# Patient Record
Sex: Female | Born: 1950
Health system: Southern US, Community
[De-identification: ages and names within clinical notes are randomized; demographics above are authoritative.]

## PROBLEM LIST (undated history)

## (undated) DIAGNOSIS — M8580 Other specified disorders of bone density and structure, unspecified site: Secondary | ICD-10-CM

## (undated) DIAGNOSIS — Z1231 Encounter for screening mammogram for malignant neoplasm of breast: Secondary | ICD-10-CM

## (undated) DIAGNOSIS — F5081 Binge eating disorder: Secondary | ICD-10-CM

## (undated) DIAGNOSIS — T7840XA Allergy, unspecified, initial encounter: Secondary | ICD-10-CM

## (undated) DIAGNOSIS — D649 Anemia, unspecified: Secondary | ICD-10-CM

## (undated) DIAGNOSIS — H269 Unspecified cataract: Secondary | ICD-10-CM

## (undated) DIAGNOSIS — K259 Gastric ulcer, unspecified as acute or chronic, without hemorrhage or perforation: Secondary | ICD-10-CM

## (undated) DIAGNOSIS — H04129 Dry eye syndrome of unspecified lacrimal gland: Secondary | ICD-10-CM

## (undated) DIAGNOSIS — K219 Gastro-esophageal reflux disease without esophagitis: Secondary | ICD-10-CM

## (undated) DIAGNOSIS — F32A Depression, unspecified: Secondary | ICD-10-CM

## (undated) DIAGNOSIS — G709 Myoneural disorder, unspecified: Secondary | ICD-10-CM

## (undated) DIAGNOSIS — R195 Other fecal abnormalities: Secondary | ICD-10-CM

## (undated) HISTORY — DX: Other fecal abnormalities: R19.5

## (undated) HISTORY — DX: Dry eye syndrome of unspecified lacrimal gland: H04.129

## (undated) HISTORY — PX: EYE SURGERY: SHX253

## (undated) HISTORY — DX: Depression, unspecified: F32.A

## (undated) HISTORY — DX: Allergy, unspecified, initial encounter: T78.40XA

## (undated) HISTORY — DX: Anemia, unspecified: D64.9

## (undated) HISTORY — DX: Unspecified cataract: H26.9

## (undated) HISTORY — DX: Gastro-esophageal reflux disease without esophagitis: K21.9

## (undated) HISTORY — PX: FOOT SURGERY: SHX648

## (undated) HISTORY — DX: Myoneural disorder, unspecified: G70.9

## (undated) HISTORY — DX: Gastric ulcer, unspecified as acute or chronic, without hemorrhage or perforation: K25.9

## (undated) MED ORDER — NAPROXEN 500 MG TAB: 500 mg | ORAL_TABLET | Freq: Two times a day (BID) | ORAL | Status: AC

---

## 1979-10-31 HISTORY — PX: OTHER SURGICAL HISTORY: SHX169

## 1999-03-02 ENCOUNTER — Ambulatory Visit (HOSPITAL_COMMUNITY): Admission: RE | Admit: 1999-03-02 | Discharge: 1999-03-02 | Payer: Self-pay | Admitting: Gastroenterology

## 2000-12-04 ENCOUNTER — Encounter: Payer: Self-pay | Admitting: Internal Medicine

## 2000-12-04 ENCOUNTER — Encounter: Admission: RE | Admit: 2000-12-04 | Discharge: 2000-12-04 | Payer: Self-pay | Admitting: Internal Medicine

## 2001-07-09 ENCOUNTER — Encounter: Payer: Self-pay | Admitting: Internal Medicine

## 2001-07-09 ENCOUNTER — Encounter: Admission: RE | Admit: 2001-07-09 | Discharge: 2001-07-09 | Payer: Self-pay | Admitting: Internal Medicine

## 2001-11-20 ENCOUNTER — Other Ambulatory Visit: Admission: RE | Admit: 2001-11-20 | Discharge: 2001-11-20 | Payer: Self-pay | Admitting: Obstetrics and Gynecology

## 2002-11-21 ENCOUNTER — Other Ambulatory Visit: Admission: RE | Admit: 2002-11-21 | Discharge: 2002-11-21 | Payer: Self-pay | Admitting: Obstetrics and Gynecology

## 2003-11-26 ENCOUNTER — Other Ambulatory Visit: Admission: RE | Admit: 2003-11-26 | Discharge: 2003-11-26 | Payer: Self-pay | Admitting: Obstetrics and Gynecology

## 2009-07-27 LAB — METABOLIC PANEL, COMPREHENSIVE
A-G Ratio: 1.3 (ref 1.2–3.5)
ALT (SGPT): 27 U/L — ABNORMAL LOW (ref 39–65)
AST (SGOT): 15 U/L (ref 15–37)
Albumin: 3.8 g/dL (ref 3.5–5.0)
Alk. phosphatase: 173 U/L — ABNORMAL HIGH (ref 50–136)
Anion gap: 5 mmol/L — ABNORMAL LOW (ref 7–16)
BUN: 17 MG/DL (ref 7–18)
Bilirubin, total: 0.8 MG/DL (ref 0.2–1.1)
CO2: 32 MMOL/L (ref 21–32)
Calcium: 8.8 MG/DL (ref 8.4–10.4)
Chloride: 106 MMOL/L (ref 98–107)
Creatinine: 0.9 MG/DL (ref 0.6–1.0)
GFR est AA: >60 mL/min/{1.73_m2} (ref >60)
GFR est non-AA: >60 mL/min/{1.73_m2} (ref >60)
Globulin: 3 g/dL (ref 2.3–3.5)
Glucose: 83 MG/DL (ref 74–106)
Potassium: 3.7 MMOL/L (ref 3.5–5.1)
Protein, total: 6.8 g/dL (ref 6.3–8.2)
Sodium: 143 MMOL/L (ref 136–145)

## 2009-07-27 LAB — LIPID PANEL
CHOL/HDL Ratio: 2.7
Cholesterol, total: 154 MG/DL (ref <200)
HDL Cholesterol: 57 MG/DL (ref 40–60)
LDL, calculated: 84.6 MG/DL (ref <100)
Triglyceride: 62 MG/DL (ref 35–150)
VLDL, calculated: 12.4 MG/DL (ref 7.0–27.0)

## 2009-07-27 LAB — BILIRUBIN, DIRECT: Bilirubin, direct: 0.1 MG/DL (ref <0.4)

## 2009-07-27 LAB — TSH 3RD GENERATION: TSH: 2.135 u[IU]/mL (ref 0.358–3.740)

## 2009-07-28 LAB — VITAMIN D, 25 HYDROXY: Vitamin D 25-Hydroxy: 26 ng/mL — ABNORMAL LOW (ref 30–80)

## 2009-07-28 LAB — GGT: GGT: 17 U/L (ref 5–55)

## 2010-12-07 ENCOUNTER — Other Ambulatory Visit (INDEPENDENT_AMBULATORY_CARE_PROVIDER_SITE_OTHER): Payer: Self-pay | Admitting: Internal Medicine

## 2010-12-13 ENCOUNTER — Ambulatory Visit (HOSPITAL_COMMUNITY): Payer: Medicaid Other

## 2011-06-19 ENCOUNTER — Other Ambulatory Visit: Payer: Self-pay

## 2012-06-27 ENCOUNTER — Encounter

## 2012-07-12 ENCOUNTER — Encounter

## 2014-04-29 NOTE — Telephone Encounter (Signed)
Pt called for medication refill on vyvanse

## 2014-04-30 MED ORDER — LISDEXAMFETAMINE 30 MG CAP
30 mg | ORAL_CAPSULE | ORAL | Status: DC
Start: 2014-04-30 — End: 2014-05-28

## 2014-05-04 NOTE — Telephone Encounter (Signed)
Called patient and informed that it is ready at the front office for pick.

## 2014-05-07 MED ORDER — TRIMETHOPRIM-SULFAMETHOXAZOLE 160 MG-800 MG TAB
160-800 mg | ORAL_TABLET | Freq: Two times a day (BID) | ORAL | Status: AC
Start: 2014-05-07 — End: 2014-05-10

## 2014-05-07 NOTE — Patient Instructions (Signed)
Urinary Tract Infection in Women: After Your Visit  Your Care Instructions     A urinary tract infection, or UTI, is a general term for an infection anywhere between the kidneys and the urethra (where urine comes out). Most UTIs are bladder infections. They often cause pain or burning when you urinate.  UTIs are caused by bacteria and can be cured with antibiotics. Be sure to complete your treatment so that the infection goes away.  Follow-up care is a key part of your treatment and safety. Be sure to make and go to all appointments, and call your doctor if you are having problems. It's also a good idea to know your test results and keep a list of the medicines you take.  How can you care for yourself at home?  ?? Take your antibiotics as directed. Do not stop taking them just because you feel better. You need to take the full course of antibiotics.  ?? Drink extra water and other fluids for the next day or two. This may help wash out the bacteria that are causing the infection. (If you have kidney, heart, or liver disease and have to limit fluids, talk with your doctor before you increase your fluid intake.)  ?? Avoid drinks that are carbonated or have caffeine. They can irritate the bladder.  ?? Urinate often. Try to empty your bladder each time.  ?? To relieve pain, take a hot bath or lay a heating pad set on low over your lower belly or genital area. Never go to sleep with a heating pad in place.  To prevent UTIs  ?? Drink plenty of water each day. This helps you urinate often, which clears bacteria from your system. (If you have kidney, heart, or liver disease and have to limit fluids, talk with your doctor before you increase your fluid intake.)  ?? Consider adding cranberry juice to your diet.  ?? Urinate when you need to.  ?? Urinate right after you have sex.  ?? Change sanitary pads often.  ?? Avoid douches, bubble baths, feminine hygiene sprays, and other feminine hygiene products that have deodorants.   ?? After going to the bathroom, wipe from front to back.  When should you call for help?  Call your doctor now or seek immediate medical care if:  ?? Symptoms such as fever, chills, nausea, or vomiting get worse or appear for the first time.  ?? You have new pain in your back just below your rib cage. This is called flank pain.  ?? There is new blood or pus in your urine.  ?? You have any problems with your antibiotic medicine.  Watch closely for changes in your health, and be sure to contact your doctor if:  ?? You are not getting better after taking an antibiotic for 2 days.  ?? Your symptoms go away but then come back.   Where can you learn more?   Go to http://www.healthwise.net/[facility]  Enter K848 in the search box to learn more about "Urinary Tract Infection in Women: After Your Visit."   ?? 2006-2015 Healthwise[employer] Inc[employer]. Care instructions adapted under license by [facility] (which disclaims liability or warranty for this information). This care instruction is for use with your licensed healthcare professional. If you have questions about a medical condition or this instruction, always ask your healthcare professional. Healthwise, Incorporated disclaims any warranty or liability for your use of this information.  Content Version: 10.5.422740; Current as of: July 08, 2013

## 2014-05-07 NOTE — Progress Notes (Signed)
05/07/2014      Chief Complaint   Patient presents with   ??? Urinary Frequency      for 2 days         HPI :  Urinary Frequency   The history is provided by the patient. This is a new problem. The current episode started 2 days ago. The problem occurs every urination. The problem has been gradually worsening. The quality of the pain is described as burning. The pain is mild (only during urination). There has been no fever. There is no history of pyelonephritis. Associated symptoms include frequency and hematuria (small amt of blood when she wiped after urination). Pertinent negatives include no chills, no sweats, no nausea, no vomiting, no flank pain, no vaginal discharge, no abdominal pain and no back pain. The patient is not pregnant.She has tried nothing for the symptoms. Her past medical history does not include recurrent UTIs.         Review of Systems   Constitutional: Negative for chills and fatigue.   Gastrointestinal: Negative for nausea, vomiting and abdominal pain.   Genitourinary: Positive for frequency and hematuria (small amt of blood when she wiped after urination). Negative for flank pain and vaginal discharge.   Musculoskeletal: Negative for back pain.   Neurological: Negative for dizziness.       History:    Past Medical History   Diagnosis Date   ??? Anemia    ??? Joint problem    ??? Binge eating    ??? Eye problem        Current Outpatient Prescriptions   Medication Sig   ??? temazepam (RESTORIL) 15 mg capsule 15 mg nightly as needed.   ??? lisdexamfetamine (VYVANSE) 30 mg capsule Take 1 Cap by mouth every morning for 30 days. Max Daily Amount: 30 mg.   ??? cyanocobalamin (VITAMIN B-12) 1,000 mcg tablet Take 1,000 mcg by mouth daily.   ??? ranitidine (ZANTAC) 300 mg tablet Take 300 mg by mouth daily.   ??? KRILL/OM3/DHA/EPA/OM6/LIP/ASTX (KRILL OIL, OMEGA 3 & 6, PO) Take  by mouth.   ??? GLUCOSAM/MSM/CHONDR/VIT C/HYAL (GLUCOSAMINE-CHONDROITIN-MSM PO) Take  by mouth.    ??? calcium-vitamin D 600 mg(1,500mg ) -200 unit tab Take 1 Tab by mouth two (2) times daily (with meals).   ??? naproxen (NAPROSYN) 500 mg tablet Take 1 Tab by mouth two (2) times daily (with meals).     No current facility-administered medications for this visit.       No Known Allergies    BP 142/90 mmHg   Pulse 68   Resp 16   Ht 5' 9.5" (1.765 m)   Wt 173 lb (78.472 kg)   BMI 25.19 kg/m2    Physical Exam   Constitutional: She appears well-developed and well-nourished. No distress.   HENT:   Head: Normocephalic and atraumatic.   Eyes: Conjunctivae are normal.   Neck: Neck supple.   Cardiovascular: Normal rate, regular rhythm and normal heart sounds.  Exam reveals no gallop and no friction rub.    No murmur heard.  Pulmonary/Chest: Effort normal and breath sounds normal.   Abdominal: Soft. Bowel sounds are normal. She exhibits no mass. There is no tenderness.   Musculoskeletal: She exhibits no edema.   Psychiatric: She has a normal mood and affect.           Assessment      1. UTI (lower urinary tract infection)    2. Urinary frequency  Plan:   1. UTI (lower urinary tract infection)    - trimethoprim-sulfamethoxazole (BACTRIM DS, SEPTRA DS) 160-800 mg per tablet; Take 1 Tab by mouth two (2) times a day for 3 days.  Dispense: 6 Tab; Refill: 0    2. Urinary frequency    - Urinalysis dip stick auo w/o micro  - trimethoprim-sulfamethoxazole (BACTRIM DS, SEPTRA DS) 160-800 mg per tablet; Take 1 Tab by mouth two (2) times a day for 3 days.  Dispense: 6 Tab; Refill: 0    She is encouraged to increase oral fluids.  Patient instructed on indications for use of new medications, proper administration, and potential side effects. Patient gave verbal understanding of use of and indication for new medication. Questions about  new medications answered today.     Follow-up Disposition:  Return keep scheduled appt.

## 2014-05-28 MED ORDER — LISDEXAMFETAMINE 30 MG CAP
30 mg | ORAL_CAPSULE | ORAL | Status: AC
Start: 2014-05-28 — End: 2014-06-27

## 2014-05-28 NOTE — Patient Instructions (Signed)
Continue medications as prescribed. Continue to exercise and follow 1200 cal diet

## 2014-05-28 NOTE — Progress Notes (Signed)
05/28/2014      Chief Complaint   Patient presents with   ??? Eating Disorder     Following up on Binge eating   ??? Numbness     follow up, as long as she is easy on her hands she is doing bettter. Recently moved to [facility]    ??? Medication Refill     vyvanse 30 mg          HPI :  Eating Disorder  History provided by: Patient following up on Binge eating Disorder. Currently on Vyvanse. . Tolerating medication well. No side effects. The problem has been rapidly improving. Pertinent negatives include no chest pain.   Numbness  History provided by: Following up on numbness and tingling in her hands.  Chronicity: Relatively new. The current episode started more than 1 week ago. Progression since onset: Improved some, then worsened as she packed in order to move to [place].  There was left upper extremity and right upper extremity focality noted. Pertinent negatives include no chest pain, no vomiting, no confusion and no nausea. Associated symptoms comments: none.   Requests refill of Vyvanse      Review of Systems   Constitutional: Negative for fever and fatigue.   Eyes: Negative for visual disturbance.   Respiratory: Negative for cough.    Cardiovascular: Negative for chest pain and palpitations.   Gastrointestinal: Negative for nausea, vomiting and diarrhea.   Neurological: Negative for tremors and weakness.   Psychiatric/Behavioral: Negative for confusion and decreased concentration.        Mood is normal       History:    Past Medical History   Diagnosis Date   ??? Anemia    ??? Joint problem    ??? Binge eating    ??? Eye problem        Current Outpatient Prescriptions   Medication Sig   ??? temazepam (RESTORIL) 15 mg capsule 15 mg nightly as needed.   ??? lisdexamfetamine (VYVANSE) 30 mg capsule Take 1 Cap by mouth every morning for 30 days. Max Daily Amount: 30 mg.   ??? cyanocobalamin (VITAMIN B-12) 1,000 mcg tablet Take 1,000 mcg by mouth daily.   ??? ranitidine (ZANTAC) 300 mg tablet Take 300 mg by mouth daily.    ??? KRILL/OM3/DHA/EPA/OM6/LIP/ASTX (KRILL OIL, OMEGA 3 & 6, PO) Take  by mouth.   ??? GLUCOSAM/MSM/CHONDR/VIT C/HYAL (GLUCOSAMINE-CHONDROITIN-MSM PO) Take  by mouth.   ??? calcium-vitamin D 600 mg(1,500mg ) -200 unit tab Take 1 Tab by mouth three (3) times daily (with meals).   ??? naproxen (NAPROSYN) 500 mg tablet Take 1 Tab by mouth two (2) times daily (with meals).     No current facility-administered medications for this visit.       Allergies   Allergen Reactions   ??? Celebrex [Celecoxib] Rash       BP 130/66 mmHg   Pulse 76   Resp 16   Ht 5' 9.5" (1.765 m)   Wt 169 lb (76.658 kg)   BMI 24.61 kg/m2    Physical Exam   Constitutional: She is oriented to person, place, and time. She appears well-developed and well-nourished. No distress.   HENT:   Head: Normocephalic and atraumatic.   Eyes: Conjunctivae and EOM are normal.   Neck: Normal range of motion. Neck supple.   Cardiovascular: Normal heart sounds.    Pulmonary/Chest: Effort normal and breath sounds normal.   Neurological: She is alert and oriented to person, place, and time.   No focal  deficit   Psychiatric: She has a normal mood and affect.       Assessment and Plan       ICD-9-CM    1. Binge-eating disorder, in parial remission, mild 307.1 lisdexamfetamine (VYVANSE) 30 mg capsule    Doing well on current treatment   2. Paresthesia and pain of both upper extremities 782.0     729.5     improving     3. BMI between 19-24,adult V85.1     Sh has lost weight since she started Vyvanse       1. Binge-eating disorder, in parial remission, mild  She will continue medication.     2. Paresthesia and pain of both upper extremities  Seems to vary with activity. She is willing to not do anything right now      Follow-up Disposition:  Return if symptoms worsen or fail to improve.

## 2014-07-08 MED ORDER — LISDEXAMFETAMINE 30 MG CAP
30 mg | ORAL_CAPSULE | Freq: Every day | ORAL | Status: DC
Start: 2014-07-08 — End: 2014-07-27

## 2014-07-08 NOTE — Telephone Encounter (Signed)
Patient called and states that we have given prescription for Vyvanse 30 mg for wrong person and would like to get right one.

## 2014-07-09 NOTE — Telephone Encounter (Signed)
Patient was here yesterday and corrected prescription was given to patient herself

## 2014-07-22 NOTE — Telephone Encounter (Signed)
Pt called for medication refill on vyvanse

## 2014-07-22 NOTE — Telephone Encounter (Signed)
Last picked this prescription on 09/09, due on 10/9. Discussed with Dr. Lelon Perla and she states that she can pick this prescription around 07/31/2014

## 2014-07-27 NOTE — Telephone Encounter (Signed)
Requested Prescriptions     Pending Prescriptions Disp Refills   ??? lisdexamfetamine (VYVANSE) 30 mg capsule 30 Cap 0     Sig: Take 1 Cap by mouth daily for 30 days. Max Daily Amount: 30 mg.     Patient can pick up around 10/02 per Dr. Lelon Perla

## 2014-07-28 MED ORDER — LISDEXAMFETAMINE 30 MG CAP
30 mg | ORAL_CAPSULE | Freq: Every day | ORAL | Status: DC
Start: 2014-07-28 — End: 2014-08-31

## 2014-07-28 NOTE — Telephone Encounter (Signed)
Pt will pick up rx Thursday afternoon.

## 2014-08-28 ENCOUNTER — Encounter

## 2014-08-28 NOTE — Telephone Encounter (Signed)
Pt called for medication refill on vyvance

## 2014-08-31 MED ORDER — LISDEXAMFETAMINE 30 MG CAP
30 mg | ORAL_CAPSULE | Freq: Every day | ORAL | Status: AC
Start: 2014-08-31 — End: 2014-09-30

## 2014-08-31 NOTE — Telephone Encounter (Signed)
Requested Prescriptions     Pending Prescriptions Disp Refills   ??? lisdexamfetamine (VYVANSE) 30 mg capsule 30 Cap 0     Sig: Take 1 Cap by mouth daily for 30 days. Max Daily Amount: 30 mg.

## 2014-09-01 NOTE — Telephone Encounter (Signed)
Patient notified

## 2014-09-16 ENCOUNTER — Other Ambulatory Visit (HOSPITAL_COMMUNITY): Payer: Self-pay | Admitting: Internal Medicine

## 2014-09-16 DIAGNOSIS — R221 Localized swelling, mass and lump, neck: Secondary | ICD-10-CM

## 2014-09-23 ENCOUNTER — Ambulatory Visit (HOSPITAL_COMMUNITY): Payer: Medicaid Other

## 2014-09-30 NOTE — Telephone Encounter (Signed)
Pt called and requested a refill on Vyvanse. She has not been seen in almost 5 mos.

## 2014-10-05 NOTE — Telephone Encounter (Signed)
Will need to be seen, can you schedule

## 2015-01-29 DIAGNOSIS — K219 Gastro-esophageal reflux disease without esophagitis: Secondary | ICD-10-CM | POA: Insufficient documentation

## 2015-06-24 ENCOUNTER — Encounter: Admission: RE | Payer: Self-pay | Source: Ambulatory Visit

## 2015-06-24 ENCOUNTER — Ambulatory Visit: Admission: RE | Admit: 2015-06-24 | Payer: Medicaid Other | Source: Ambulatory Visit | Admitting: Gastroenterology

## 2015-06-24 SURGERY — COLONOSCOPY WITH PROPOFOL
Anesthesia: General

## 2016-01-04 ENCOUNTER — Encounter: Payer: Self-pay | Admitting: Family Medicine

## 2016-01-04 ENCOUNTER — Ambulatory Visit (INDEPENDENT_AMBULATORY_CARE_PROVIDER_SITE_OTHER): Payer: BLUE CROSS/BLUE SHIELD | Admitting: Family Medicine

## 2016-01-04 VITALS — BP 136/81 | HR 78 | Temp 98.7°F | Ht 68.0 in | Wt 204.0 lb

## 2016-01-04 DIAGNOSIS — D649 Anemia, unspecified: Secondary | ICD-10-CM

## 2016-01-04 DIAGNOSIS — R635 Abnormal weight gain: Secondary | ICD-10-CM | POA: Diagnosis not present

## 2016-01-04 DIAGNOSIS — F50819 Binge eating disorder, unspecified: Secondary | ICD-10-CM | POA: Insufficient documentation

## 2016-01-04 DIAGNOSIS — F5081 Binge eating disorder: Secondary | ICD-10-CM | POA: Diagnosis not present

## 2016-01-04 DIAGNOSIS — F32A Depression, unspecified: Secondary | ICD-10-CM

## 2016-01-04 DIAGNOSIS — F329 Major depressive disorder, single episode, unspecified: Secondary | ICD-10-CM | POA: Diagnosis not present

## 2016-01-04 DIAGNOSIS — D509 Iron deficiency anemia, unspecified: Secondary | ICD-10-CM | POA: Insufficient documentation

## 2016-01-04 NOTE — Progress Notes (Signed)
BP 136/81 mmHg  Pulse 78  Temp(Src) 98.7 F (37.1 C)  Ht 5\' 8"  (1.727 m)  Wt 204 lb (92.534 kg)  BMI 31.03 kg/m2  SpO2 100%   Subjective:    Patient ID: Mckenzie Webster, female    DOB: 06-14-1951, 65 y.o.   MRN: [id number]  HPI: Mckenzie Webster is a 65 y.o. female who presents today to establish care. Hasn't seen a doctor regularly in the past year or so.   Chief Complaint  Patient presents with  . Depression    She has tried several different medications in the past that seemed to not have worked.  . Weight Gain    Patient has gained about 35 lbs in the last year. She believes that the depression is related to the weight gain. She was previously on Vyvanse for weight loss, and she believes that it did work, she states that she is a binge eater, so that medication helped it   WEIGHT GAIN- eats compulsively, has had a problem for years. Used vyvanse in the past for a bit less than a year and had been doing well with it. Has binge eating disorder. Depression seems to have direct correlation with her weight gain. Has not worked with [employer] [facility] in the past Duration: chronic for years Previous attempts at weight loss: yes Complications of obesity: none Peak weight: 245 pre bypass, 220 post-bypass Weight loss goal: 180 Weight loss to date: 16lbs Requesting obesity pharmacotherapy: yes Current weight loss supplements/medications: no Previous weight loss supplements/meds: yes, vyvanse, diet pills  DEPRESSION Mood status: exacerbated Satisfied with current treatment?: no Symptom severity: moderate to severe Duration of current treatment : has not had any treatment recently Psychotherapy/counseling: yes, in the past, wasn't very successful  Previous psychiatric medications: wellbutrin Depressed mood: yes Anxious mood: no Anhedonia: no Significant weight loss or gain: yes Insomnia: yes hard to stay asleep, does OK with the melatonin Fatigue: no, unmotivated Feelings of  worthlessness or guilt: yes Impaired concentration/indecisiveness: yes Suicidal ideations: no Hopelessness: no Crying spells: yes Depression screen PHQ 2/9 01/04/2016  Decreased Interest 2  Down, Depressed, Hopeless 2  PHQ - 2 Score 4  Altered sleeping 2  Tired, decreased energy 3  Change in appetite 3  Feeling bad or failure about yourself  3  Trouble concentrating 1  Moving slowly or fidgety/restless 0  Suicidal thoughts 0  PHQ-9 Score 16  Difficult doing work/chores Somewhat difficult   GAD 7 : Generalized Anxiety Score 01/04/2016  Nervous, Anxious, on Edge 1  Control/stop worrying 3  Worry too much - different things 3  Trouble relaxing 2  Restless 0  Easily annoyed or irritable 2  Afraid - awful might happen 1  Total GAD 7 Score 12  Anxiety Difficulty Somewhat difficult    Active Ambulatory Problems    Diagnosis Date Noted  . Anemia   . Binge eating disorder 01/04/2016   Resolved Ambulatory Problems    Diagnosis Date Noted  . No Resolved Ambulatory Problems   Past Medical History  Diagnosis Date  . Cataract   . Stomach ulcer   . Dry eye syndrome    Past Surgical History  Procedure Laterality Date  . Eye surgery      Cataract  . Gastric bypass  1981  . Foot surgery      Change the alignment of the toes to stop the formation of corns   No Known Allergies  Family History  Problem Relation Age of Onset  .  Heart disease Mother   . COPD Mother   . Heart disease Father   . Hypertension Father   . Heart disease Maternal Grandfather   . Alzheimer's disease Paternal Grandmother    Social History   Social History  . Marital Status: Legally Separated    Spouse Name: N/A  . Number of Children: N/A  . Years of Education: N/A   Social History Main Topics  . Smoking status: Former Smoker    Quit date: 10/31/1995  . Smokeless tobacco: Never Used  . Alcohol Use: Yes     Comment: on occasion  . Drug Use: No  . Sexual Activity: Not Currently   Other  Topics Concern  . None   Social History Narrative  . None   Review of Systems  Constitutional: Negative.   Respiratory: Negative.   Cardiovascular: Negative.   Musculoskeletal: Negative.   Psychiatric/Behavioral: Negative.     Per HPI unless specifically indicated above     Objective:    BP 136/81 mmHg  Pulse 78  Temp(Src) 98.7 F (37.1 C)  Ht 5\' 8"  (1.727 m)  Wt 204 lb (92.534 kg)  BMI 31.03 kg/m2  SpO2 100%  Wt Readings from Last 3 Encounters:  01/04/16 204 lb (92.534 kg)    Physical Exam  Constitutional: She is oriented to person, place, and time. She appears well-developed and well-nourished. No distress.  HENT:  Head: Normocephalic and atraumatic.  Right Ear: Hearing normal.  Left Ear: Hearing normal.  Nose: Nose normal.  Eyes: Conjunctivae and lids are normal. Right eye exhibits no discharge. Left eye exhibits no discharge. No scleral icterus.  Cardiovascular: Normal rate, regular rhythm, normal heart sounds and intact distal pulses.  Exam reveals no gallop and no friction rub.   No murmur heard. Pulmonary/Chest: Effort normal and breath sounds normal. No respiratory distress. She has no wheezes. She has no rales. She exhibits no tenderness.  Musculoskeletal: Normal range of motion.  Neurological: She is alert and oriented to person, place, and time.  Skin: Skin is warm, dry and intact. No rash noted. No erythema. No pallor.  Psychiatric: She has a normal mood and affect. Her speech is normal and behavior is normal. Judgment and thought content normal. Cognition and memory are normal.  Nursing note and vitals reviewed.   No results found for this or any previous visit.    Assessment & Plan:   Problem List Items Addressed This Visit      Other   Anemia    Checking labs today. Await results.       Relevant Orders   CBC with Differential/Platelet   Binge eating disorder - Primary    Under poor control. Not in any counseling. Not in OA. Information  about both of these given today. Would like to go back on vyvanse. I informed patient that I don't prescribe controlled substances on the first visit. Will obtain previous records from last doctor and determine if this is a good medicine for her. Await records.        Other Visit Diagnoses    Depression        Due to weight gain. Will try to get her into OA and counseling. Await records.     Relevant Orders    Thyroid Panel With TSH    Comprehensive metabolic panel    Weight gain        Due to binge eating disorder. Will work on getting that treated.    Relevant Orders  Thyroid Panel With TSH    Comprehensive metabolic panel        Follow up plan: Return Pending records release.

## 2016-01-04 NOTE — Assessment & Plan Note (Signed)
Under poor control. Not in any counseling. Not in OA. Information about both of these given today. Would like to go back on vyvanse. I informed patient that I don't prescribe controlled substances on the first visit. Will obtain previous records from last doctor and determine if this is a good medicine for her. Await records.

## 2016-01-04 NOTE — Assessment & Plan Note (Signed)
Checking labs today. Await results.  

## 2016-01-05 ENCOUNTER — Telehealth: Payer: Self-pay | Admitting: Family Medicine

## 2016-01-05 DIAGNOSIS — D649 Anemia, unspecified: Secondary | ICD-10-CM

## 2016-01-05 LAB — CBC WITH DIFFERENTIAL/PLATELET
Basophils Absolute: 0 10*3/uL (ref 0.0–0.2)
Basos: 1 %
EOS (ABSOLUTE): 0.2 10*3/uL (ref 0.0–0.4)
EOS: 5 %
Hematocrit: 25.7 % — ABNORMAL LOW (ref 34.0–46.6)
Hemoglobin: 7.1 g/dL — ABNORMAL LOW (ref 11.1–15.9)
IMMATURE GRANULOCYTES: 0 %
Immature Grans (Abs): 0 10*3/uL (ref 0.0–0.1)
Lymphocytes Absolute: 1.3 10*3/uL (ref 0.7–3.1)
Lymphs: 29 %
MCH: 17 pg — ABNORMAL LOW (ref 26.6–33.0)
MCHC: 27.6 g/dL — ABNORMAL LOW (ref 31.5–35.7)
MCV: 62 fL — ABNORMAL LOW (ref 79–97)
MONOS ABS: 0.5 10*3/uL (ref 0.1–0.9)
Monocytes: 12 %
NEUTROS PCT: 53 %
Neutrophils Absolute: 2.4 10*3/uL (ref 1.4–7.0)
PLATELETS: 365 10*3/uL (ref 150–379)
RBC: 4.18 x10E6/uL (ref 3.77–5.28)
RDW: 21.3 % — AB (ref 12.3–15.4)
WBC: 4.4 10*3/uL (ref 3.4–10.8)

## 2016-01-05 LAB — THYROID PANEL WITH TSH
Free Thyroxine Index: 2.2 (ref 1.2–4.9)
T3 UPTAKE RATIO: 27 % (ref 24–39)
T4 TOTAL: 8.3 ug/dL (ref 4.5–12.0)
TSH: 2.97 u[IU]/mL (ref 0.450–4.500)

## 2016-01-05 LAB — COMPREHENSIVE METABOLIC PANEL
A/G RATIO: 1.8 (ref 1.1–2.5)
ALK PHOS: 156 IU/L — AB (ref 39–117)
ALT: 15 IU/L (ref 0–32)
AST: 22 IU/L (ref 0–40)
Albumin: 4.2 g/dL (ref 3.6–4.8)
BILIRUBIN TOTAL: 0.4 mg/dL (ref 0.0–1.2)
BUN / CREAT RATIO: 30 — AB (ref 11–26)
BUN: 22 mg/dL (ref 8–27)
CHLORIDE: 102 mmol/L (ref 96–106)
CO2: 26 mmol/L (ref 18–29)
Calcium: 9 mg/dL (ref 8.7–10.3)
Creatinine, Ser: 0.73 mg/dL (ref 0.57–1.00)
GFR calc Af Amer: 101 mL/min/{1.73_m2} (ref >59)
GFR calc non Af Amer: 87 mL/min/{1.73_m2} (ref >59)
GLOBULIN, TOTAL: 2.3 g/dL (ref 1.5–4.5)
Glucose: 81 mg/dL (ref 65–99)
Potassium: 4.7 mmol/L (ref 3.5–5.2)
SODIUM: 140 mmol/L (ref 134–144)
Total Protein: 6.5 g/dL (ref 6.0–8.5)

## 2016-01-05 MED ORDER — FERROUS SULFATE 325 (65 FE) MG PO TBEC: 325.0000 mg | DELAYED_RELEASE_TABLET | Freq: Three times a day (TID) | ORAL | Status: DC

## 2016-01-05 NOTE — Telephone Encounter (Signed)
Please see if we can get her into hematology ASAP for anemia.

## 2016-01-05 NOTE — Telephone Encounter (Signed)
Called and discussed results. Discussed that she could go to the ER for a transfusion. She doesn't want to go to the ER. She would like to try oral iron. Will start it. Will check blood count again in 1 week. Will come in next Friday to get it checked. Iron sent to her pharmacy. Warning signs for which to go to the ER discussed.

## 2016-01-05 NOTE — Telephone Encounter (Signed)
[facility], left a message for someone to call me to schedule an appointment ASAP.

## 2016-01-06 ENCOUNTER — Inpatient Hospital Stay: Payer: BLUE CROSS/BLUE SHIELD | Attending: Internal Medicine | Admitting: Internal Medicine

## 2016-01-06 ENCOUNTER — Encounter: Payer: Self-pay | Admitting: Internal Medicine

## 2016-01-06 ENCOUNTER — Inpatient Hospital Stay: Payer: BLUE CROSS/BLUE SHIELD

## 2016-01-06 VITALS — BP 149/99 | HR 74 | Temp 96.8°F | Ht 68.5 in | Wt 207.0 lb

## 2016-01-06 DIAGNOSIS — Z79899 Other long term (current) drug therapy: Secondary | ICD-10-CM | POA: Diagnosis not present

## 2016-01-06 DIAGNOSIS — Z9884 Bariatric surgery status: Secondary | ICD-10-CM | POA: Diagnosis not present

## 2016-01-06 DIAGNOSIS — Z87891 Personal history of nicotine dependence: Secondary | ICD-10-CM | POA: Insufficient documentation

## 2016-01-06 DIAGNOSIS — D508 Other iron deficiency anemias: Secondary | ICD-10-CM | POA: Diagnosis not present

## 2016-01-06 DIAGNOSIS — R0600 Dyspnea, unspecified: Secondary | ICD-10-CM | POA: Insufficient documentation

## 2016-01-06 LAB — VITAMIN B12: VITAMIN B 12: 585 pg/mL (ref 180–914)

## 2016-01-06 LAB — FOLATE: FOLATE: 38 ng/mL (ref >5.9)

## 2016-01-06 LAB — CBC WITH DIFFERENTIAL/PLATELET
Basophils Absolute: 0 10*3/uL (ref 0–0.1)
Basophils Relative: 0 %
Eosinophils Absolute: 0.2 10*3/uL (ref 0–0.7)
Eosinophils Relative: 4 %
HEMATOCRIT: 25 % — AB (ref 35.0–47.0)
Hemoglobin: 7.4 g/dL — ABNORMAL LOW (ref 12.0–16.0)
Lymphocytes Relative: 34 %
Lymphs Abs: 1.3 10*3/uL (ref 1.0–3.6)
MCH: 17.5 pg — ABNORMAL LOW (ref 26.0–34.0)
MCHC: 29.6 g/dL — ABNORMAL LOW (ref 32.0–36.0)
MCV: 59 fL — AB (ref 80.0–100.0)
Monocytes Absolute: 0.3 10*3/uL (ref 0.2–0.9)
Monocytes Relative: 7 %
NRBC: 2 /100{WBCs} — AB
Neutro Abs: 2.1 10*3/uL (ref 1.4–6.5)
Neutrophils Relative %: 55 %
PLATELETS: 300 10*3/uL (ref 150–440)
RBC: 4.23 MIL/uL (ref 3.80–5.20)
RDW: 22.6 % — ABNORMAL HIGH (ref 11.5–14.5)
SMEAR REVIEW: ADEQUATE
WBC: 3.9 10*3/uL (ref 3.6–11.0)

## 2016-01-06 LAB — FERRITIN: Ferritin: 3 ng/mL — ABNORMAL LOW (ref 11–307)

## 2016-01-06 LAB — IRON AND TIBC
Iron: 172 ug/dL — ABNORMAL HIGH (ref 28–170)
SATURATION RATIOS: 34 % — AB (ref 10.4–31.8)
TIBC: 512 ug/dL — AB (ref 250–450)
UIBC: 340 ug/dL

## 2016-01-06 LAB — LACTATE DEHYDROGENASE: LDH: 140 U/L (ref 98–192)

## 2016-01-06 NOTE — Progress Notes (Signed)
[place] NOTE  Patient Care Team: Valerie Roys, DO as PCP - General (Family Medicine)  CHIEF COMPLAINTS/PURPOSE OF CONSULTATION:   # IRON DEFICIENCY ANEMIA- sec to gastric bypass; Colonoscopy [> 10 years]  # Gastric Bypass [1980]  HISTORY OF PRESENTING ILLNESS:  Mckenzie Webster 65 y.o.  female previous history of gastric bypass/ anemia from iron deficiency is here for a initial consultation. Patient stated that she had been feeling winded for the last many weeks; and when she had a CBC with her PCP- showed hemoglobin of 7.1; MCV 62. She has been referred was for further evaluation and recommendations.  Patient stated that she was diagnosed with iron deficiency anemia 6-7 years ago- which improved with by mouth iron. She does not need any IV iron. However 2 years ago she stopped taking by mouth iron/ as his hemoglobin was good.  Patient denies any blood in stools black stools. Denies any blood in urine or menorrhagia. Had history of colonoscopy many years ago [> 10 years]. No weight loss. No difficulty swallowing or pain with swallowing.  ROS: A complete 10 point review of system is done which is negative except mentioned above in history of present illness  MEDICAL HISTORY:  Past Medical History  Diagnosis Date  . Cataract   . Stomach ulcer   . Anemia   . Dry eye syndrome     SURGICAL HISTORY: Past Surgical History  Procedure Laterality Date  . Eye surgery      Cataract  . Gastric bypass  1981  . Foot surgery      Change the alignment of the toes to stop the formation of corns    SOCIAL HISTORY: Social History   Social History  . Marital Status: Legally Separated    Spouse Name: N/A  . Number of Children: N/A  . Years of Education: N/A   Occupational History  . Not on file.   Social History Main Topics  . Smoking status: Former Smoker    Quit date: 10/31/1995  . Smokeless tobacco: Former Systems developer  . Alcohol Use: Yes     Comment: on  occasion  . Drug Use: No  . Sexual Activity: Not Currently   Other Topics Concern  . Not on file   Social History Narrative    FAMILY HISTORY: Family History  Problem Relation Age of Onset  . Heart disease Mother   . COPD Mother   . Heart disease Father   . Hypertension Father   . Heart disease Maternal Grandfather   . Alzheimer's disease Paternal Grandmother     ALLERGIES:  has No Known Allergies.  MEDICATIONS:  Current Outpatient Prescriptions  Medication Sig Dispense Refill  . Calcium-Vitamin D-Vitamin K (CALCIUM + D + K PO) Take by mouth. 750mg  calcium, 500 vitamin d and 40mg     . ferrous sulfate 325 (65 FE) MG EC tablet Take 1 tablet (325 mg total) by mouth 3 (three) times daily with meals. 90 tablet 3  . Multiple Vitamins-Minerals (CENTRUM SILVER PO) Take by mouth.    . naproxen sodium (ANAPROX) 220 MG tablet Take 440 mg by mouth daily.    [name] Kitchen OVER THE COUNTER MEDICATION More Free Ultra- Joint health    . ranitidine (ZANTAC) 150 MG tablet Take 300 mg by mouth daily.     No current facility-administered medications for this visit.      [name] Kitchen  PHYSICAL EXAMINATION:   Filed Vitals:   01/06/16 1138  BP: 149/99  Pulse: 74  Temp: 96.8 F (36 C)   Filed Weights   01/06/16 1138  Weight: 207 lb 0.2 oz (93.9 kg)    GENERAL: Well-nourished well-developed; Alert, no distress and comfortable.  Alone. EYES: no pallor or icterus OROPHARYNX: no thrush or ulceration; good dentition  NECK: supple, no masses felt LYMPH:  no palpable lymphadenopathy in the cervical, axillary or inguinal regions LUNGS: clear to auscultation and  No wheeze or crackles HEART/CVS: regular rate & rhythm and no murmurs; No lower extremity edema ABDOMEN: abdomen soft, non-tender and normal bowel sounds Musculoskeletal:no cyanosis of digits and no clubbing  PSYCH: alert & oriented x 3 with fluent speech NEURO: no focal motor/sensory deficits SKIN:  no rashes or significant lesions  LABORATORY  DATA:  I have reviewed the data as listed Lab Results  Component Value Date   WBC 4.4 01/04/2016   HCT 25.7* 01/04/2016   MCV 62* 01/04/2016   PLT 365 01/04/2016    Recent Labs  01/04/16 1555  NA 140  K 4.7  CL 102  CO2 26  GLUCOSE 81  BUN 22  CREATININE 0.73  CALCIUM 9.0  GFRNONAA 87  GFRAA 101  PROT 6.5  ALBUMIN 4.2  AST 22  ALT 15  ALKPHOS 156*  BILITOT 0.4     ASSESSMENT & PLAN:   #  Iron deficient anemia severe hemoglobin 7.0/  Symptomatic. Likely from malabsorption given previous history of gastric bypass.  I recommend checking CBC/ ferritin iron studies/ 123456 folic acid LDH today.   #  Recommend IV iron 2 for heme starting tomorrow.  She is also recommended  To keep taking by mouth iron  3 times a day. She seems to be tolerating the pills okay.  #  Patient follow-up with me in approximately 3 months/ CBC ferritin.  If needed Will plan IV iron at the time. Patient will also need a GI evaluation in future [colonoscopy more than 10 years ago]; also check stool occult at next visit.   #  Patient was asked to call us and let us know if she does not feel any better in the next few weeks;  We will then get a CBC.  Thank you Dr.Johnson for allowing me to participate in the care of your pleasant patient. Please do not hesitate to contact me with questions or concerns in the interim.  # 30 minutes face-to-face with the patient discussing the above plan of care; more than 50% of time spent on counseling and coordination.      Cammie Sickle, MD 01/06/2016 11:51 AM

## 2016-01-06 NOTE — Telephone Encounter (Signed)
Wonderful. 

## 2016-01-06 NOTE — Telephone Encounter (Signed)
Patient is scheduled for 01/06/16 @ 11:00am.

## 2016-01-06 NOTE — Progress Notes (Signed)
Patient ambulates independently without assistance, brought to exam room 5.  Patient denies pain or discomfort, vitals documented.  Medication record updated, information provided by patient.

## 2016-01-07 ENCOUNTER — Inpatient Hospital Stay: Payer: BLUE CROSS/BLUE SHIELD

## 2016-01-07 ENCOUNTER — Other Ambulatory Visit: Payer: Self-pay

## 2016-01-07 ENCOUNTER — Telehealth: Payer: Self-pay

## 2016-01-07 VITALS — BP 131/86 | HR 71 | Temp 98.5°F

## 2016-01-07 DIAGNOSIS — D508 Other iron deficiency anemias: Secondary | ICD-10-CM | POA: Diagnosis not present

## 2016-01-07 MED ORDER — SODIUM CHLORIDE 0.9 % IV SOLN
510.0000 mg | Freq: Once | INTRAVENOUS | Status: AC
  Administered 2016-01-07: 510 mg via INTRAVENOUS
  Filled 2016-01-07: qty 17

## 2016-01-07 MED ORDER — SODIUM CHLORIDE 0.9 % IV SOLN
Freq: Once | INTRAVENOUS | Status: AC
  Administered 2016-01-07: 10:00:00 via INTRAVENOUS
  Filled 2016-01-07: qty 1000

## 2016-01-07 NOTE — Telephone Encounter (Signed)
Patient called to let us know that she went to see hematology, she is getting an iron infusion this week, and again next Friday.

## 2016-01-14 ENCOUNTER — Inpatient Hospital Stay: Payer: BLUE CROSS/BLUE SHIELD

## 2016-01-14 VITALS — BP 139/87 | HR 76 | Resp 20

## 2016-01-14 DIAGNOSIS — D508 Other iron deficiency anemias: Secondary | ICD-10-CM | POA: Diagnosis not present

## 2016-01-14 MED ORDER — SODIUM CHLORIDE 0.9 % IV SOLN
Freq: Once | INTRAVENOUS | Status: AC
  Administered 2016-01-14: 10:00:00 via INTRAVENOUS
  Filled 2016-01-14: qty 1000

## 2016-01-14 MED ORDER — SODIUM CHLORIDE 0.9 % IV SOLN
510.0000 mg | Freq: Once | INTRAVENOUS | Status: AC
  Administered 2016-01-14: 510 mg via INTRAVENOUS
  Filled 2016-01-14: qty 17

## 2016-01-20 ENCOUNTER — Encounter: Payer: Self-pay | Admitting: Family Medicine

## 2016-02-09 ENCOUNTER — Telehealth: Payer: Self-pay | Admitting: Family Medicine

## 2016-02-09 NOTE — Telephone Encounter (Signed)
I got her records, OK to schedule a follow up whenever she likes

## 2016-02-10 ENCOUNTER — Telehealth: Payer: Self-pay | Admitting: Family Medicine

## 2016-02-10 NOTE — Telephone Encounter (Signed)
Patient notified

## 2016-02-10 NOTE — Telephone Encounter (Signed)
Forward to provider

## 2016-02-10 NOTE — Telephone Encounter (Signed)
Moist heat, rest, ibuprofen. Let me know if not getting better.

## 2016-02-10 NOTE — Telephone Encounter (Signed)
Pt would like to know what she can take for a pulled muscle in her back. Would like a call back.

## 2016-02-16 ENCOUNTER — Other Ambulatory Visit: Payer: Self-pay | Admitting: Family Medicine

## 2016-02-16 ENCOUNTER — Encounter: Payer: Self-pay | Admitting: Family Medicine

## 2016-02-16 ENCOUNTER — Ambulatory Visit (INDEPENDENT_AMBULATORY_CARE_PROVIDER_SITE_OTHER): Payer: BLUE CROSS/BLUE SHIELD | Admitting: Family Medicine

## 2016-02-16 VITALS — BP 136/78 | HR 69 | Temp 98.1°F | Ht 68.5 in | Wt 200.0 lb

## 2016-02-16 DIAGNOSIS — Z23 Encounter for immunization: Secondary | ICD-10-CM | POA: Diagnosis not present

## 2016-02-16 DIAGNOSIS — D509 Iron deficiency anemia, unspecified: Secondary | ICD-10-CM

## 2016-02-16 DIAGNOSIS — M545 Low back pain, unspecified: Secondary | ICD-10-CM

## 2016-02-16 DIAGNOSIS — F5081 Binge eating disorder: Secondary | ICD-10-CM

## 2016-02-16 LAB — CBC WITH DIFFERENTIAL/PLATELET
HEMOGLOBIN: 13.1 g/dL (ref 11.1–15.9)
Hematocrit: 40.5 % (ref 34.0–46.6)
LYMPHS ABS: 1 10*3/uL (ref 0.7–3.1)
Lymphs: 23 %
MCH: 26.7 pg (ref 26.6–33.0)
MCHC: 32.3 g/dL (ref 31.5–35.7)
MCV: 83 fL (ref 79–97)
MID (ABSOLUTE): 0.5 10*3/uL (ref 0.1–1.6)
MID: 11 %
NEUTROS ABS: 3 10*3/uL (ref 1.4–7.0)
Neutrophils: 66 %
Platelets: 171 10*3/uL (ref 150–379)
RBC: 4.91 x10E6/uL (ref 3.77–5.28)
RDW: 33.6 % — ABNORMAL HIGH (ref 12.3–15.4)
WBC: 4.5 10*3/uL (ref 3.4–10.8)

## 2016-02-16 MED ORDER — LISDEXAMFETAMINE DIMESYLATE 30 MG PO CAPS: 30.0000 mg | ORAL_CAPSULE | Freq: Every day | ORAL | Status: DC

## 2016-02-16 NOTE — Assessment & Plan Note (Signed)
Did well on vyvanse in the past. Will restart 30mg  and titrate up as needed. Recheck 1 month. Will call with what pharmacy she'd like to go to.

## 2016-02-16 NOTE — Assessment & Plan Note (Signed)
Doing much better. Hgb improved. Continue iron. Continue to follow with hematology. Call with any concerns.

## 2016-02-16 NOTE — Progress Notes (Signed)
BP 136/78 mmHg  Pulse 69  Temp(Src) 98.1 F (36.7 C)  Ht 5' 8.5" (1.74 m)  Wt 200 lb (90.719 kg)  BMI 29.96 kg/m2  SpO2 100%   Subjective:    Patient ID: Mckenzie Webster, female    DOB: [birth year], 65 y.o.   MRN: [id number]  HPI: Mckenzie Webster is a 65 y.o. female  Chief Complaint  Patient presents with  . Eating Disorder   ANEMIA Anemia status: better Etiology of anemia: iron deficiency Duration of anemia treatment: about a month Compliance with treatment: excellent compliance Iron supplementation side effects: no Severity of anemia: moderate Fatigue: no Decreased exercise tolerance: no  Dyspnea on exertion: no Palpitations: no Bleeding: no Pica: no   Still feeling like she is binging at night. She becomes very anxious with it. Reviewed notes from previous provider and did well on vyvanse 30mg  in the past.   Back is doing better now, had been doing a lot of pulling and yard work. No other concerns at this time.   Relevant past medical, surgical, family and social history reviewed and updated as indicated. Interim medical history since our last visit reviewed. Allergies and medications reviewed and updated.  Review of Systems  Constitutional: Negative.   Respiratory: Negative.   Cardiovascular: Negative.   Musculoskeletal: Positive for myalgias and back pain. Negative for joint swelling, arthralgias, gait problem, neck pain and neck stiffness.  Psychiatric/Behavioral: Negative.     Per HPI unless specifically indicated above     Objective:    BP 136/78 mmHg  Pulse 69  Temp(Src) 98.1 F (36.7 C)  Ht 5' 8.5" (1.74 m)  Wt 200 lb (90.719 kg)  BMI 29.96 kg/m2  SpO2 100%  Wt Readings from Last 3 Encounters:  02/16/16 200 lb (90.719 kg)  01/06/16 207 lb 0.2 oz (93.9 kg)  01/04/16 204 lb (92.534 kg)    Physical Exam  Constitutional: She is oriented to person, place, and time. She appears well-developed and well-nourished. No distress.  HENT:  Head:  Normocephalic and atraumatic.  Right Ear: Hearing normal.  Left Ear: Hearing normal.  Nose: Nose normal.  Eyes: Conjunctivae and lids are normal. Right eye exhibits no discharge. Left eye exhibits no discharge. No scleral icterus.  Pulmonary/Chest: Effort normal. No respiratory distress.  Musculoskeletal: Normal range of motion.  Neurological: She is alert and oriented to person, place, and time.  Skin: Skin is intact. No rash noted.  Psychiatric: She has a normal mood and affect. Her speech is normal and behavior is normal. Judgment and thought content normal. Cognition and memory are normal.    Results for orders placed or performed in visit on 01/06/16  Lactate dehydrogenase  Result Value Ref Range   LDH 140 98 - 192 U/L  CBC with Differential/Platelet  Result Value Ref Range   WBC 3.9 3.6 - 11.0 K/uL   RBC 4.23 3.80 - 5.20 MIL/uL   Hemoglobin 7.4 (L) 12.0 - 16.0 g/dL   HCT 25.0 (L) 35.0 - 47.0 %   MCV 59.0 (L) 80.0 - 100.0 fL   MCH 17.5 (L) 26.0 - 34.0 pg   MCHC 29.6 (L) 32.0 - 36.0 g/dL   RDW 22.6 (H) 11.5 - 14.5 %   Platelets 300 150 - 440 K/uL   Neutrophils Relative % 55 %   Lymphocytes Relative 34 %   Monocytes Relative 7 %   Eosinophils Relative 4 %   Basophils Relative 0 %   nRBC 2 (H) 0 /100 WBC  Neutro Abs 2.1 1.4 - 6.5 K/uL   Lymphs Abs 1.3 1.0 - 3.6 K/uL   Monocytes Absolute 0.3 0.2 - 0.9 K/uL   Eosinophils Absolute 0.2 0 - 0.7 K/uL   Basophils Absolute 0.0 0 - 0.1 K/uL   RBC Morphology MIXED RBC POPULATION    Smear Review PLATELETS APPEAR ADEQUATE   Ferritin  Result Value Ref Range   Ferritin 3 (L) 11 - 307 ng/mL  Iron and TIBC  Result Value Ref Range   Iron 172 (H) 28 - 170 ug/dL   TIBC 512 (H) 250 - 450 ug/dL   Saturation Ratios 34 (H) 10.4 - 31.8 %   UIBC 340 ug/dL  Vitamin B12  Result Value Ref Range   Vitamin B-12 585 180 - 914 pg/mL  Folate  Result Value Ref Range   Folate 38.0 >5.9 ng/mL      Assessment & Plan:   Problem List Items  Addressed This Visit      Other   Iron deficiency anemia    Doing much better. Hgb improved. Continue iron. Continue to follow with hematology. Call with any concerns.       Binge eating disorder - Primary    Did well on vyvanse in the past. Will restart 30mg  and titrate up as needed. Recheck 1 month. Will call with what pharmacy she'd like to go to.       Relevant Medications   lisdexamfetamine (VYVANSE) 30 MG capsule    Other Visit Diagnoses    Immunization due        Tdap given today.    Relevant Orders    Tdap vaccine greater than or equal to 7yo IM (Completed)    Bilateral low back pain without sciatica        Resolved. Exercises given today to try to prevent in the future. Call with any concerns.         Follow up plan: Return in about 4 weeks (around 03/15/2016) for follow up binge eating.

## 2016-02-16 NOTE — Patient Instructions (Signed)

## 2016-02-17 ENCOUNTER — Encounter: Payer: Self-pay | Admitting: Family Medicine

## 2016-02-17 LAB — IRON AND TIBC
Iron Saturation: 19 % (ref 15–55)
Iron: 55 ug/dL (ref 27–139)
Total Iron Binding Capacity: 285 ug/dL (ref 250–450)
UIBC: 230 ug/dL (ref 118–369)

## 2016-02-17 LAB — FERRITIN: FERRITIN: 67 ng/mL (ref 15–150)

## 2016-02-24 ENCOUNTER — Encounter: Payer: Self-pay | Admitting: Family Medicine

## 2016-03-01 ENCOUNTER — Other Ambulatory Visit: Payer: Self-pay | Admitting: Family Medicine

## 2016-03-01 ENCOUNTER — Encounter: Payer: BLUE CROSS/BLUE SHIELD | Admitting: Family Medicine

## 2016-03-01 DIAGNOSIS — F5081 Binge eating disorder: Secondary | ICD-10-CM

## 2016-03-01 MED ORDER — LISDEXAMFETAMINE DIMESYLATE 30 MG PO CAPS: 30.0000 mg | ORAL_CAPSULE | Freq: Every day | ORAL | Status: DC

## 2016-03-01 NOTE — Progress Notes (Signed)
This encounter was created in error - please disregard.

## 2016-03-15 ENCOUNTER — Ambulatory Visit: Payer: BLUE CROSS/BLUE SHIELD | Admitting: Family Medicine

## 2016-03-16 ENCOUNTER — Other Ambulatory Visit: Payer: Self-pay | Admitting: Urology

## 2016-03-16 DIAGNOSIS — R310 Gross hematuria: Secondary | ICD-10-CM

## 2016-03-22 ENCOUNTER — Encounter: Payer: Self-pay | Admitting: Family Medicine

## 2016-03-22 ENCOUNTER — Ambulatory Visit (INDEPENDENT_AMBULATORY_CARE_PROVIDER_SITE_OTHER): Payer: BLUE CROSS/BLUE SHIELD | Admitting: Family Medicine

## 2016-03-22 VITALS — BP 124/81 | HR 70 | Temp 98.3°F | Ht 69.0 in | Wt 197.0 lb

## 2016-03-22 DIAGNOSIS — F5081 Binge eating disorder: Secondary | ICD-10-CM | POA: Diagnosis not present

## 2016-03-22 MED ORDER — LISDEXAMFETAMINE DIMESYLATE 30 MG PO CAPS: 30.0000 mg | ORAL_CAPSULE | Freq: Every day | ORAL | Status: DC

## 2016-03-22 NOTE — Assessment & Plan Note (Signed)
Under good control. Continue current regimen. Continue to monitor. 3 month supply of medications given today. Call with any concerns.

## 2016-03-22 NOTE — Progress Notes (Signed)
BP 124/81 mmHg  Pulse 70  Temp(Src) 98.3 F (36.8 C)  Ht 5\' 9"  (1.753 m)  Wt 197 lb (89.359 kg)  BMI 29.08 kg/m2  SpO2 100%   Subjective:    Patient ID: Mckenzie Webster, female    DOB: 1951/05/03, 65 y.o.   MRN: [id number]  HPI: Mckenzie Webster is a 65 y.o. female  Chief Complaint  Patient presents with  . Binge Eating   Feels like it is working very well. Seems to be under good control. Not thinking about food all the time. Has lost 10lbs in 2 months. Otherwise feeling OK.  Has been having some gross hematuria. Went to the urgent care and is being worked up. Saw urology. No pain. In the middle of the work up. Having a CT next week. No other concerns or complaints at this time.  Relevant past medical, surgical, family and social history reviewed and updated as indicated. Interim medical history since our last visit reviewed. Allergies and medications reviewed and updated.  Review of Systems  Constitutional: Negative.   Respiratory: Negative.   Cardiovascular: Negative.   Genitourinary: Positive for hematuria. Negative for dysuria, urgency, frequency, flank pain, decreased urine volume, vaginal bleeding, vaginal discharge, enuresis, difficulty urinating, genital sores, vaginal pain, menstrual problem, pelvic pain and dyspareunia.  Hematological: Negative.   Psychiatric/Behavioral: Negative.     Per HPI unless specifically indicated above     Objective:    BP 124/81 mmHg  Pulse 70  Temp(Src) 98.3 F (36.8 C)  Ht 5\' 9"  (1.753 m)  Wt 197 lb (89.359 kg)  BMI 29.08 kg/m2  SpO2 100%  Wt Readings from Last 3 Encounters:  03/22/16 197 lb (89.359 kg)  02/16/16 200 lb (90.719 kg)  01/06/16 207 lb 0.2 oz (93.9 kg)    Physical Exam  Constitutional: She is oriented to person, place, and time. She appears well-developed and well-nourished. No distress.  HENT:  Head: Normocephalic and atraumatic.  Right Ear: Hearing normal.  Left Ear: Hearing normal.  Nose: Nose normal.   Eyes: Conjunctivae and lids are normal. Right eye exhibits no discharge. Left eye exhibits no discharge. No scleral icterus.  Pulmonary/Chest: Effort normal. No respiratory distress.  Musculoskeletal: Normal range of motion.  Neurological: She is alert and oriented to person, place, and time.  Skin: Skin is warm, dry and intact. No rash noted. No erythema. No pallor.  Psychiatric: She has a normal mood and affect. Her speech is normal and behavior is normal. Judgment and thought content normal. Cognition and memory are normal.    Results for orders placed or performed in visit on 02/16/16  CBC With Differential/Platelet  Result Value Ref Range   WBC 4.5 3.4 - 10.8 x10E3/uL   RBC 4.91 3.77 - 5.28 x10E6/uL   Hemoglobin 13.1 11.1 - 15.9 g/dL   Hematocrit 40.5 34.0 - 46.6 %   MCV 83 79 - 97 fL   MCH 26.7 26.6 - 33.0 pg   MCHC 32.3 31.5 - 35.7 g/dL   RDW 33.6 (H) 12.3 - 15.4 %   Platelets 171 150 - 379 x10E3/uL   Neutrophils 66 %   Lymphs 23 %   MID 11 %   Neutrophils Absolute 3.0 1.4 - 7.0 x10E3/uL   Lymphocytes Absolute 1.0 0.7 - 3.1 x10E3/uL   MID (Absolute) 0.5 0.1 - 1.6 X10E3/uL  Iron and TIBC  Result Value Ref Range   Total Iron Binding Capacity 285 250 - 450 ug/dL   UIBC 230 118 -  369 ug/dL   Iron 55 27 - 139 ug/dL   Iron Saturation 19 15 - 55 %  Ferritin  Result Value Ref Range   Ferritin 67 15 - 150 ng/mL      Assessment & Plan:   Problem List Items Addressed This Visit      Other   Binge eating disorder - Primary    Under good control. Continue current regimen. Continue to monitor. 3 month supply of medications given today. Call with any concerns.       Relevant Medications   lisdexamfetamine (VYVANSE) 30 MG capsule       Follow up plan: Return in about 3 months (around 06/22/2016) for follow up binge eating.

## 2016-03-23 ENCOUNTER — Ambulatory Visit
Admission: RE | Admit: 2016-03-23 | Discharge: 2016-03-23 | Disposition: A | Discharge status: Home / Self Care | Payer: BLUE CROSS/BLUE SHIELD | Source: Ambulatory Visit | Attending: Urology | Admitting: Urology

## 2016-03-23 DIAGNOSIS — I7090 Unspecified atherosclerosis: Secondary | ICD-10-CM | POA: Diagnosis not present

## 2016-03-23 DIAGNOSIS — R310 Gross hematuria: Secondary | ICD-10-CM | POA: Diagnosis not present

## 2016-03-23 MED ORDER — IOPAMIDOL (ISOVUE-300) INJECTION 61%
125.0000 mL | Freq: Once | INTRAVENOUS | Status: AC | PRN
  Administered 2016-03-23: 125 mL via INTRAVENOUS

## 2016-04-04 ENCOUNTER — Other Ambulatory Visit: Payer: Self-pay | Admitting: *Deleted

## 2016-04-04 DIAGNOSIS — D509 Iron deficiency anemia, unspecified: Secondary | ICD-10-CM

## 2016-04-06 ENCOUNTER — Inpatient Hospital Stay: Payer: BLUE CROSS/BLUE SHIELD | Attending: Internal Medicine

## 2016-04-06 DIAGNOSIS — D508 Other iron deficiency anemias: Secondary | ICD-10-CM | POA: Diagnosis present

## 2016-04-06 DIAGNOSIS — D509 Iron deficiency anemia, unspecified: Secondary | ICD-10-CM

## 2016-04-06 DIAGNOSIS — R5383 Other fatigue: Secondary | ICD-10-CM | POA: Insufficient documentation

## 2016-04-06 DIAGNOSIS — Z79899 Other long term (current) drug therapy: Secondary | ICD-10-CM | POA: Insufficient documentation

## 2016-04-06 DIAGNOSIS — Z9884 Bariatric surgery status: Secondary | ICD-10-CM | POA: Insufficient documentation

## 2016-04-06 DIAGNOSIS — Z87891 Personal history of nicotine dependence: Secondary | ICD-10-CM | POA: Insufficient documentation

## 2016-04-06 DIAGNOSIS — K912 Postsurgical malabsorption, not elsewhere classified: Secondary | ICD-10-CM | POA: Diagnosis not present

## 2016-04-06 LAB — CBC WITH DIFFERENTIAL/PLATELET
BASOS ABS: 0 10*3/uL (ref 0–0.1)
BASOS PCT: 1 %
EOS ABS: 0.2 10*3/uL (ref 0–0.7)
EOS PCT: 5 %
HEMATOCRIT: 38.8 % (ref 35.0–47.0)
Hemoglobin: 13 g/dL (ref 12.0–16.0)
Lymphocytes Relative: 25 %
Lymphs Abs: 1.1 10*3/uL (ref 1.0–3.6)
MCH: 28.8 pg (ref 26.0–34.0)
MCHC: 33.5 g/dL (ref 32.0–36.0)
MCV: 85.9 fL (ref 80.0–100.0)
MONO ABS: 0.4 10*3/uL (ref 0.2–0.9)
MONOS PCT: 10 %
Neutro Abs: 2.5 10*3/uL (ref 1.4–6.5)
Neutrophils Relative %: 59 %
PLATELETS: 214 10*3/uL (ref 150–440)
RBC: 4.51 MIL/uL (ref 3.80–5.20)
RDW: 13.5 % (ref 11.5–14.5)
WBC: 4.3 10*3/uL (ref 3.6–11.0)

## 2016-04-06 LAB — FERRITIN: FERRITIN: 17 ng/mL (ref 11–307)

## 2016-04-07 ENCOUNTER — Inpatient Hospital Stay: Payer: BLUE CROSS/BLUE SHIELD

## 2016-04-07 ENCOUNTER — Inpatient Hospital Stay (HOSPITAL_BASED_OUTPATIENT_CLINIC_OR_DEPARTMENT_OTHER): Payer: BLUE CROSS/BLUE SHIELD | Admitting: Internal Medicine

## 2016-04-07 VITALS — BP 153/99 | HR 62 | Temp 95.5°F | Resp 18 | Wt 195.8 lb

## 2016-04-07 DIAGNOSIS — Z9884 Bariatric surgery status: Secondary | ICD-10-CM | POA: Diagnosis not present

## 2016-04-07 DIAGNOSIS — R5383 Other fatigue: Secondary | ICD-10-CM

## 2016-04-07 DIAGNOSIS — D509 Iron deficiency anemia, unspecified: Secondary | ICD-10-CM

## 2016-04-07 DIAGNOSIS — Z87891 Personal history of nicotine dependence: Secondary | ICD-10-CM

## 2016-04-07 DIAGNOSIS — K912 Postsurgical malabsorption, not elsewhere classified: Secondary | ICD-10-CM | POA: Diagnosis not present

## 2016-04-07 DIAGNOSIS — D508 Other iron deficiency anemias: Secondary | ICD-10-CM | POA: Diagnosis not present

## 2016-04-07 DIAGNOSIS — Z79899 Other long term (current) drug therapy: Secondary | ICD-10-CM

## 2016-04-07 NOTE — Progress Notes (Signed)
[place] NOTE  Patient Care Team: Valerie Roys, DO as PCP - General (Family Medicine) Conception Chancy, DDS (Dentistry)  CHIEF COMPLAINTS/PURPOSE OF CONSULTATION:   # IRON DEFICIENCY ANEMIA- sec to gastric bypass; Colonoscopy [> 10 years]; IV ferrhem x2 Luetta Nutting 2017]  # Gastric Bypass [1980]  HISTORY OF PRESENTING ILLNESS:  Mckenzie Webster 65 y.o.  female previous history of gastric bypass/ anemia from iron deficiency. Patient status post IV iron in March 2017.  Her energy levels are improved. Fatigue improved. Overall she feels significantly better. She is taking iron pills once a day.  Patient denies any blood in stools black stools.  No weight loss. No difficulty swallowing or pain with swallowing.  ROS: A complete 10 point review of system is done which is negative except mentioned above in history of present illness  MEDICAL HISTORY:  Past Medical History  Diagnosis Date  . Cataract   . Stomach ulcer   . Anemia   . Dry eye syndrome     SURGICAL HISTORY: Past Surgical History  Procedure Laterality Date  . Eye surgery      Cataract  . Gastric bypass  1981  . Foot surgery      Change the alignment of the toes to stop the formation of corns    SOCIAL HISTORY: Social History   Social History  . Marital Status: Legally Separated    Spouse Name: N/A  . Number of Children: N/A  . Years of Education: N/A   Occupational History  . Not on file.   Social History Main Topics  . Smoking status: Former Smoker    Quit date: 10/31/1995  . Smokeless tobacco: Former Systems developer  . Alcohol Use: Yes     Comment: on occasion  . Drug Use: No  . Sexual Activity: Not Currently   Other Topics Concern  . Not on file   Social History Narrative    FAMILY HISTORY: Family History  Problem Relation Age of Onset  . Heart disease Mother   . COPD Mother   . Heart disease Father   . Hypertension Father   . Heart disease Maternal Grandfather   . Alzheimer's  disease Paternal Grandmother     ALLERGIES:  has No Known Allergies.  MEDICATIONS:  Current Outpatient Prescriptions  Medication Sig Dispense Refill  . Calcium-Vitamin D-Vitamin K (CALCIUM + D + K PO) Take by mouth. 750mg  calcium, 500 vitamin d and 40mg     . ferrous sulfate 325 (65 FE) MG EC tablet Take 1 tablet (325 mg total) by mouth 3 (three) times daily with meals. (Patient taking differently: Take 325 mg by mouth 2 (two) times daily. ) 90 tablet 3  . lisdexamfetamine (VYVANSE) 30 MG capsule Take 1 capsule (30 mg total) by mouth daily. 30 capsule 0  . Multiple Vitamins-Minerals (CENTRUM SILVER PO) Take by mouth.    . naproxen sodium (ANAPROX) 220 MG tablet Take 440 mg by mouth daily.    [name] Kitchen OVER THE COUNTER MEDICATION More Free Ultra- Joint health    . ranitidine (ZANTAC) 150 MG tablet Take 150 mg by mouth 2 (two) times daily.      No current facility-administered medications for this visit.      [name] Kitchen  PHYSICAL EXAMINATION:   Filed Vitals:   04/07/16 0941  BP: 153/99  Pulse: 62  Temp: 95.5 F (35.3 C)  Resp: 18   Filed Weights   04/07/16 0941  Weight: 195 lb 12.3 oz (88.8 kg)  GENERAL: Well-nourished well-developed; Alert, no distress and comfortable.  Alone. EYES: no pallor or icterus OROPHARYNX: no thrush or ulceration; good dentition  NECK: supple, no masses felt LYMPH:  no palpable lymphadenopathy in the cervical, axillary or inguinal regions LUNGS: clear to auscultation and  No wheeze or crackles HEART/CVS: regular rate & rhythm and no murmurs; No lower extremity edema ABDOMEN: abdomen soft, non-tender and normal bowel sounds Musculoskeletal:no cyanosis of digits and no clubbing  PSYCH: alert & oriented x 3 with fluent speech NEURO: no focal motor/sensory deficits SKIN:  no rashes or significant lesions  LABORATORY DATA:  I have reviewed the data as listed Lab Results  Component Value Date   WBC 4.3 04/06/2016   HGB 13.0 04/06/2016   HCT 38.8 04/06/2016    MCV 85.9 04/06/2016   PLT 214 04/06/2016    Recent Labs  01/04/16 1555  NA 140  K 4.7  CL 102  CO2 26  GLUCOSE 81  BUN 22  CREATININE 0.73  CALCIUM 9.0  GFRNONAA 87  GFRAA 101  PROT 6.5  ALBUMIN 4.2  AST 22  ALT 15  ALKPHOS 156*  BILITOT 0.4     ASSESSMENT & PLAN:   #  Iron deficient anemia severe hemoglobin 7.0-Likely from malabsorption given previous history of gastric bypass / status post IV Feraheme 2. Hemoglobin 13 ferritin 17 patient taking by mouth iron twice a day. Recommend compliance.  # Given the history of gastric bypass recommend- continued B12 sublingual supplementation.  # Recheck CBC answered his ferritin in 4 months follow-up with nurse practitioner/Trish; possible Feraheme if needed. Labs 1 week.before.      Cammie Sickle, MD 04/07/2016 9:57 AM

## 2016-06-21 ENCOUNTER — Encounter: Payer: Self-pay | Admitting: Family Medicine

## 2016-06-21 ENCOUNTER — Ambulatory Visit (INDEPENDENT_AMBULATORY_CARE_PROVIDER_SITE_OTHER): Payer: BLUE CROSS/BLUE SHIELD | Admitting: Family Medicine

## 2016-06-21 VITALS — BP 129/88 | HR 79 | Temp 97.7°F | Wt 187.0 lb

## 2016-06-21 DIAGNOSIS — F5081 Binge eating disorder: Secondary | ICD-10-CM

## 2016-06-21 DIAGNOSIS — F50819 Binge eating disorder, unspecified: Secondary | ICD-10-CM

## 2016-06-21 MED ORDER — LISDEXAMFETAMINE DIMESYLATE 30 MG PO CAPS: 30.0000 mg | ORAL_CAPSULE | Freq: Every day | ORAL | 0 refills | Status: DC

## 2016-06-21 NOTE — Progress Notes (Signed)
BP 129/88 (BP Location: Left Arm, Patient Position: Sitting, Cuff Size: Normal)   Pulse 79   Temp 97.7 F (36.5 C)   Wt 187 lb (84.8 kg)   SpO2 98%   BMI 27.62 kg/m    Subjective:    Patient ID: Mckenzie Webster, female    DOB: 14-Apr-1951, 65 y.o.   MRN: [id number]  HPI: Mckenzie Webster is a 65 y.o. female  Chief Complaint  Patient presents with  . Other    Binge Eating Disorder   Has been doing really well. Under good control with her medication. Cravings easier to handle. Feeling well. No side effects. Has been losing weight. Concern about the cost- going on medicare in January. Otherwise doing well with no other concerns or complaints at this time.   Relevant past medical, surgical, family and social history reviewed and updated as indicated. Interim medical history since our last visit reviewed. Allergies and medications reviewed and updated.  Review of Systems  Constitutional: Negative.   Respiratory: Negative.   Cardiovascular: Negative.   Psychiatric/Behavioral: Negative.     Per HPI unless specifically indicated above     Objective:    BP 129/88 (BP Location: Left Arm, Patient Position: Sitting, Cuff Size: Normal)   Pulse 79   Temp 97.7 F (36.5 C)   Wt 187 lb (84.8 kg)   SpO2 98%   BMI 27.62 kg/m   Wt Readings from Last 3 Encounters:  06/21/16 187 lb (84.8 kg)  04/07/16 195 lb 12.3 oz (88.8 kg)  03/22/16 197 lb (89.4 kg)    Physical Exam  Constitutional: She is oriented to person, place, and time. She appears well-developed and well-nourished. No distress.  HENT:  Head: Normocephalic and atraumatic.  Right Ear: Hearing normal.  Left Ear: Hearing normal.  Nose: Nose normal.  Eyes: Conjunctivae and lids are normal. Right eye exhibits no discharge. Left eye exhibits no discharge. No scleral icterus.  Cardiovascular: Normal rate, regular rhythm, normal heart sounds and intact distal pulses.  Exam reveals no gallop and no friction rub.   No murmur  heard. Pulmonary/Chest: Effort normal and breath sounds normal. No respiratory distress. She has no wheezes. She has no rales. She exhibits no tenderness.  Musculoskeletal: Normal range of motion.  Neurological: She is alert and oriented to person, place, and time.  Skin: Skin is warm, dry and intact. No rash noted. No erythema. No pallor.  Psychiatric: She has a normal mood and affect. Her speech is normal and behavior is normal. Judgment and thought content normal. Cognition and memory are normal.  Nursing note and vitals reviewed.   Results for orders placed or performed in visit on 04/06/16  CBC with Differential  Result Value Ref Range   WBC 4.3 3.6 - 11.0 K/uL   RBC 4.51 3.80 - 5.20 MIL/uL   Hemoglobin 13.0 12.0 - 16.0 g/dL   HCT 38.8 35.0 - 47.0 %   MCV 85.9 80.0 - 100.0 fL   MCH 28.8 26.0 - 34.0 pg   MCHC 33.5 32.0 - 36.0 g/dL   RDW 13.5 11.5 - 14.5 %   Platelets 214 150 - 440 K/uL   Neutrophils Relative % 59 %   Neutro Abs 2.5 1.4 - 6.5 K/uL   Lymphocytes Relative 25 %   Lymphs Abs 1.1 1.0 - 3.6 K/uL   Monocytes Relative 10 %   Monocytes Absolute 0.4 0.2 - 0.9 K/uL   Eosinophils Relative 5 %   Eosinophils Absolute 0.2 0 -  0.7 K/uL   Basophils Relative 1 %   Basophils Absolute 0.0 0 - 0.1 K/uL  Ferritin  Result Value Ref Range   Ferritin 17 11 - 307 ng/mL      Assessment & Plan:   Problem List Items Addressed This Visit    None    Visit Diagnoses   None.      Follow up plan: No Follow-up on file.

## 2016-06-21 NOTE — Assessment & Plan Note (Signed)
Under good control. Continue current regimen. Continue to monitor. Recheck 3 months.

## 2016-08-10 ENCOUNTER — Inpatient Hospital Stay: Payer: BLUE CROSS/BLUE SHIELD

## 2016-08-16 ENCOUNTER — Ambulatory Visit: Payer: BLUE CROSS/BLUE SHIELD

## 2016-09-15 ENCOUNTER — Ambulatory Visit: Payer: BLUE CROSS/BLUE SHIELD | Admitting: Family Medicine

## 2016-09-27 ENCOUNTER — Ambulatory Visit: Payer: BLUE CROSS/BLUE SHIELD | Admitting: Family Medicine

## 2016-10-13 ENCOUNTER — Ambulatory Visit (INDEPENDENT_AMBULATORY_CARE_PROVIDER_SITE_OTHER): Payer: BLUE CROSS/BLUE SHIELD | Admitting: Family Medicine

## 2016-10-13 ENCOUNTER — Encounter: Payer: Self-pay | Admitting: Family Medicine

## 2016-10-13 VITALS — BP 131/88 | HR 68 | Temp 98.0°F | Wt 198.2 lb

## 2016-10-13 DIAGNOSIS — F5081 Binge eating disorder: Secondary | ICD-10-CM | POA: Diagnosis not present

## 2016-10-13 MED ORDER — LISDEXAMFETAMINE DIMESYLATE 30 MG PO CAPS: 30.0000 mg | ORAL_CAPSULE | Freq: Every day | ORAL | 0 refills | Status: DC

## 2016-10-13 MED ORDER — TRAZODONE HCL 50 MG PO TABS: 25.0000 mg | ORAL_TABLET | Freq: Every evening | ORAL | 1 refills | Status: DC | PRN

## 2016-10-13 NOTE — Assessment & Plan Note (Signed)
Will start her on low dose trazodone to help during bad time at night. Call with any concerns. Follow up in 3 months. Doing well on the vyvanse. Refills given for 3 months.

## 2016-10-13 NOTE — Progress Notes (Signed)
BP 131/88 (BP Location: Left Arm, Cuff Size: Large)   Pulse 68   Temp 98 F (36.7 C)   Wt 198 lb 3.2 oz (89.9 kg)   SpO2 98%   BMI 29.27 kg/m    Subjective:    Patient ID: Mckenzie Webster, female    DOB: [birth year], 65 y.o.   MRN: [id number]  HPI: Mckenzie Webster is a 65 y.o. female  Chief Complaint  Patient presents with  . binge eating disorder   Doing OK with her medicine. Tolerating it well. Seems like it wears off as the day goes on. Takes the vyvanse between 10-12, takes a while to kick in, then not interested in food for several hours. Doesn't feel the urge to binge. Not sure when at night it wears off. Tends to go to bed about 11 or so. Gets up at night to go to the bathroom to eat. Has been under more stress recently. Otherwise doing well with no other concerns or complaints at this time.   Relevant past medical, surgical, family and social history reviewed and updated as indicated. Interim medical history since our last visit reviewed. Allergies and medications reviewed and updated.  Review of Systems  Constitutional: Negative.   Respiratory: Negative.   Cardiovascular: Negative.   Gastrointestinal: Negative.   Psychiatric/Behavioral: Negative.     Per HPI unless specifically indicated above     Objective:    BP 131/88 (BP Location: Left Arm, Cuff Size: Large)   Pulse 68   Temp 98 F (36.7 C)   Wt 198 lb 3.2 oz (89.9 kg)   SpO2 98%   BMI 29.27 kg/m   Wt Readings from Last 3 Encounters:  10/13/16 198 lb 3.2 oz (89.9 kg)  06/21/16 187 lb (84.8 kg)  04/07/16 195 lb 12.3 oz (88.8 kg)    Physical Exam  Constitutional: She is oriented to person, place, and time. She appears well-developed and well-nourished. No distress.  HENT:  Head: Normocephalic and atraumatic.  Right Ear: Hearing normal.  Left Ear: Hearing normal.  Nose: Nose normal.  Eyes: Conjunctivae and lids are normal. Right eye exhibits no discharge. Left eye exhibits no discharge. No scleral  icterus.  Cardiovascular: Normal rate, regular rhythm, normal heart sounds and intact distal pulses.  Exam reveals no gallop and no friction rub.   No murmur heard. Pulmonary/Chest: Effort normal and breath sounds normal. No respiratory distress. She has no wheezes. She has no rales. She exhibits no tenderness.  Musculoskeletal: Normal range of motion.  Neurological: She is alert and oriented to person, place, and time.  Skin: Skin is warm, dry and intact. No rash noted. No erythema. No pallor.  Psychiatric: She has a normal mood and affect. Her speech is normal and behavior is normal. Judgment and thought content normal. Cognition and memory are normal.  Nursing note and vitals reviewed.   Results for orders placed or performed in visit on 04/06/16  CBC with Differential  Result Value Ref Range   WBC 4.3 3.6 - 11.0 K/uL   RBC 4.51 3.80 - 5.20 MIL/uL   Hemoglobin 13.0 12.0 - 16.0 g/dL   HCT 38.8 35.0 - 47.0 %   MCV 85.9 80.0 - 100.0 fL   MCH 28.8 26.0 - 34.0 pg   MCHC 33.5 32.0 - 36.0 g/dL   RDW 13.5 11.5 - 14.5 %   Platelets 214 150 - 440 K/uL   Neutrophils Relative % 59 %   Neutro Abs 2.5 1.4 -  6.5 K/uL   Lymphocytes Relative 25 %   Lymphs Abs 1.1 1.0 - 3.6 K/uL   Monocytes Relative 10 %   Monocytes Absolute 0.4 0.2 - 0.9 K/uL   Eosinophils Relative 5 %   Eosinophils Absolute 0.2 0 - 0.7 K/uL   Basophils Relative 1 %   Basophils Absolute 0.0 0 - 0.1 K/uL  Ferritin  Result Value Ref Range   Ferritin 17 11 - 307 ng/mL      Assessment & Plan:   Problem List Items Addressed This Visit      Other   Binge eating disorder - Primary    Will start her on low dose trazodone to help during bad time at night. Call with any concerns. Follow up in 3 months. Doing well on the vyvanse. Refills given for 3 months.       Relevant Medications   lisdexamfetamine (VYVANSE) 30 MG capsule       Follow up plan: Return in about 3 months (around 01/11/2017).

## 2016-11-24 ENCOUNTER — Ambulatory Visit (INDEPENDENT_AMBULATORY_CARE_PROVIDER_SITE_OTHER): Payer: PPO | Admitting: Family Medicine

## 2016-11-24 VITALS — BP 143/87 | HR 71 | Temp 98.6°F | Ht 69.69 in | Wt 202.0 lb

## 2016-11-24 DIAGNOSIS — Z131 Encounter for screening for diabetes mellitus: Secondary | ICD-10-CM

## 2016-11-24 DIAGNOSIS — Z1239 Encounter for other screening for malignant neoplasm of breast: Secondary | ICD-10-CM

## 2016-11-24 DIAGNOSIS — E663 Overweight: Secondary | ICD-10-CM

## 2016-11-24 DIAGNOSIS — M545 Low back pain, unspecified: Secondary | ICD-10-CM

## 2016-11-24 DIAGNOSIS — Z114 Encounter for screening for human immunodeficiency virus [HIV]: Secondary | ICD-10-CM

## 2016-11-24 DIAGNOSIS — D509 Iron deficiency anemia, unspecified: Secondary | ICD-10-CM

## 2016-11-24 DIAGNOSIS — Z124 Encounter for screening for malignant neoplasm of cervix: Secondary | ICD-10-CM

## 2016-11-24 DIAGNOSIS — Z1231 Encounter for screening mammogram for malignant neoplasm of breast: Secondary | ICD-10-CM

## 2016-11-24 DIAGNOSIS — Z13820 Encounter for screening for osteoporosis: Secondary | ICD-10-CM

## 2016-11-24 DIAGNOSIS — F5081 Binge eating disorder: Secondary | ICD-10-CM

## 2016-11-24 DIAGNOSIS — Z13220 Encounter for screening for lipoid disorders: Secondary | ICD-10-CM

## 2016-11-24 DIAGNOSIS — Z0000 Encounter for general adult medical examination without abnormal findings: Secondary | ICD-10-CM

## 2016-11-24 DIAGNOSIS — Z23 Encounter for immunization: Secondary | ICD-10-CM

## 2016-11-24 DIAGNOSIS — Z1211 Encounter for screening for malignant neoplasm of colon: Secondary | ICD-10-CM

## 2016-11-24 DIAGNOSIS — Z1159 Encounter for screening for other viral diseases: Secondary | ICD-10-CM

## 2016-11-24 DIAGNOSIS — Z8249 Family history of ischemic heart disease and other diseases of the circulatory system: Secondary | ICD-10-CM | POA: Diagnosis not present

## 2016-11-24 DIAGNOSIS — F50819 Binge eating disorder, unspecified: Secondary | ICD-10-CM

## 2016-11-24 DIAGNOSIS — R87610 Atypical squamous cells of undetermined significance on cytologic smear of cervix (ASC-US): Secondary | ICD-10-CM | POA: Diagnosis not present

## 2016-11-24 LAB — UA/M W/RFLX CULTURE, ROUTINE
Bilirubin, UA: NEGATIVE
Glucose, UA: NEGATIVE
Ketones, UA: NEGATIVE
LEUKOCYTES UA: NEGATIVE
Nitrite, UA: NEGATIVE
PH UA: 7 (ref 5.0–7.5)
PROTEIN UA: NEGATIVE
RBC UA: NEGATIVE
SPEC GRAV UA: 1.02 (ref 1.005–1.030)
Urobilinogen, Ur: 0.2 mg/dL (ref 0.2–1.0)

## 2016-11-24 LAB — BAYER DCA HB A1C WAIVED: HB A1C (BAYER DCA - WAIVED): 5.5 % (ref <7.0)

## 2016-11-24 NOTE — Patient Instructions (Addendum)
Preventative Services:  AAA screening: Ordered today Health Risk Assessment and Personalized Prevention Plan: Done today Bone Mass Measurements: Ordered today Breast Cancer Screening: Ordered today CVD Screening: Done today Cervical Cancer Screening: Done today- abnormal pap in 2008, still abnormal 2012 Colon Cancer Screening: Ordered today Depression Screening: Done today Diabetes Screening: Done today Glaucoma Screening: See your Eye doctor Hepatitis B vaccine: N/A Hepatitis C screening: Ordered HIV Screening: Ordered today Flu Vaccine: Done today Lung cancer Screening: N/A Obesity Screening: Done today Pneumonia Vaccines (2): Prevnar given today, will get pneuomvax in 1 year STI Screening: N/A  Health Maintenance, Female Introduction Adopting a healthy lifestyle and getting preventive care can go a long way to promote health and wellness. Talk with your health care provider about what schedule of regular examinations is right for you. This is a good chance for you to check in with your provider about disease prevention and staying healthy. In between checkups, there are plenty of things you can do on your own. Experts have done a lot of research about which lifestyle changes and preventive measures are most likely to keep you healthy. Ask your health care provider for more information. Weight and diet Eat a healthy diet  Be sure to include plenty of vegetables, fruits, low-fat dairy products, and lean protein.  Do not eat a lot of foods high in solid fats, added sugars, or salt.  Get regular exercise. This is one of the most important things you can do for your health.  Most adults should exercise for at least 150 minutes each week. The exercise should increase your heart rate and make you sweat (moderate-intensity exercise).  Most adults should also do strengthening exercises at least twice a week. This is in addition to the moderate-intensity exercise. Maintain a healthy  weight  Body mass index (BMI) is a measurement that can be used to identify possible weight problems. It estimates body fat based on height and weight. Your health care provider can help determine your BMI and help you achieve or maintain a healthy weight.  For females 17 years of age and older:  A BMI below 18.5 is considered underweight.  A BMI of 18.5 to 24.9 is normal.  A BMI of 25 to 29.9 is considered overweight.  A BMI of 30 and above is considered obese. Watch levels of cholesterol and blood lipids  You should start having your blood tested for lipids and cholesterol at 65 years of age, then have this test every 5 years.  You may need to have your cholesterol levels checked more often if:  Your lipid or cholesterol levels are high.  You are older than 65 years of age.  You are at high risk for heart disease. Cancer screening Lung Cancer  Lung cancer screening is recommended for adults 30-66 years old who are at high risk for lung cancer because of a history of smoking.  A yearly low-dose CT scan of the lungs is recommended for people who:  Currently smoke.  Have quit within the past 15 years.  Have at least a 30-pack-year history of smoking. A pack year is smoking an average of one pack of cigarettes a day for 1 year.  Yearly screening should continue until it has been 15 years since you quit.  Yearly screening should stop if you develop a health problem that would prevent you from having lung cancer treatment. Breast Cancer  Practice breast self-awareness. This means understanding how your breasts normally appear and feel.  It  also means doing regular breast self-exams. Let your health care provider know about any changes, no matter how small.  If you are in your 20s or 30s, you should have a clinical breast exam (CBE) by a health care provider every 1-3 years as part of a regular health exam.  If you are 24 or older, have a CBE every year. Also consider  having a breast X-ray (mammogram) every year.  If you have a family history of breast cancer, talk to your health care provider about genetic screening.  If you are at high risk for breast cancer, talk to your health care provider about having an MRI and a mammogram every year.  Breast cancer gene (BRCA) assessment is recommended for women who have family members with BRCA-related cancers. BRCA-related cancers include:  Breast.  Ovarian.  Tubal.  Peritoneal cancers.  Results of the assessment will determine the need for genetic counseling and BRCA1 and BRCA2 testing. Cervical Cancer  Your health care provider may recommend that you be screened regularly for cancer of the pelvic organs (ovaries, uterus, and vagina). This screening involves a pelvic examination, including checking for microscopic changes to the surface of your cervix (Pap test). You may be encouraged to have this screening done every 3 years, beginning at age 87.  For women ages 10-65, health care providers may recommend pelvic exams and Pap testing every 3 years, or they may recommend the Pap and pelvic exam, combined with testing for human papilloma virus (HPV), every 5 years. Some types of HPV increase your risk of cervical cancer. Testing for HPV may also be done on women of any age with unclear Pap test results.  Other health care providers may not recommend any screening for nonpregnant women who are considered low risk for pelvic cancer and who do not have symptoms. Ask your health care provider if a screening pelvic exam is right for you.  If you have had past treatment for cervical cancer or a condition that could lead to cancer, you need Pap tests and screening for cancer for at least 20 years after your treatment. If Pap tests have been discontinued, your risk factors (such as having a new sexual partner) need to be reassessed to determine if screening should resume. Some women have medical problems that increase the  chance of getting cervical cancer. In these cases, your health care provider may recommend more frequent screening and Pap tests. Colorectal Cancer  This type of cancer can be detected and often prevented.  Routine colorectal cancer screening usually begins at 65 years of age and continues through 65 years of age.  Your health care provider may recommend screening at an earlier age if you have risk factors for colon cancer.  Your health care provider may also recommend using home test kits to check for hidden blood in the stool.  A small camera at the end of a tube can be used to examine your colon directly (sigmoidoscopy or colonoscopy). This is done to check for the earliest forms of colorectal cancer.  Routine screening usually begins at age 76.  Direct examination of the colon should be repeated every 5-10 years through 65 years of age. However, you may need to be screened more often if early forms of precancerous polyps or small growths are found. Skin Cancer  Check your skin from head to toe regularly.  Tell your health care provider about any new moles or changes in moles, especially if there is a change in a  mole's shape or color.  Also tell your health care provider if you have a mole that is larger than the size of a pencil eraser.  Always use sunscreen. Apply sunscreen liberally and repeatedly throughout the day.  Protect yourself by wearing long sleeves, pants, a wide-brimmed hat, and sunglasses whenever you are outside. Heart disease, diabetes, and high blood pressure  High blood pressure causes heart disease and increases the risk of stroke. High blood pressure is more likely to develop in:  People who have blood pressure in the high end of the normal range (130-139/85-89 mm Hg).  People who are overweight or obese.  People who are African American.  If you are 10-65 years of age, have your blood pressure checked every 3-5 years. If you are 66 years of age or older,  have your blood pressure checked every year. You should have your blood pressure measured twice-once when you are at a hospital or clinic, and once when you are not at a hospital or clinic. Record the average of the two measurements. To check your blood pressure when you are not at a hospital or clinic, you can use:  An automated blood pressure machine at a pharmacy.  A home blood pressure monitor.  If you are between 19 years and 47 years old, ask your health care provider if you should take aspirin to prevent strokes.  Have regular diabetes screenings. This involves taking a blood sample to check your fasting blood sugar level.  If you are at a normal weight and have a low risk for diabetes, have this test once every three years after 65 years of age.  If you are overweight and have a high risk for diabetes, consider being tested at a younger age or more often. Preventing infection Hepatitis B  If you have a higher risk for hepatitis B, you should be screened for this virus. You are considered at high risk for hepatitis B if:  You were born in a country where hepatitis B is common. Ask your health care provider which countries are considered high risk.  Your parents were born in a high-risk country, and you have not been immunized against hepatitis B (hepatitis B vaccine).  You have HIV or AIDS.  You use needles to inject street drugs.  You live with someone who has hepatitis B.  You have had sex with someone who has hepatitis B.  You get hemodialysis treatment.  You take certain medicines for conditions, including cancer, organ transplantation, and autoimmune conditions. Hepatitis C  Blood testing is recommended for:  Everyone born from [birth year] through 1964.  Anyone with known risk factors for hepatitis C. Sexually transmitted infections (STIs)  You should be screened for sexually transmitted infections (STIs) including gonorrhea and chlamydia if:  You are sexually active  and are younger than 65 years of age.  You are older than 65 years of age and your health care provider tells you that you are at risk for this type of infection.  Your sexual activity has changed since you were last screened and you are at an increased risk for chlamydia or gonorrhea. Ask your health care provider if you are at risk.  If you do not have HIV, but are at risk, it may be recommended that you take a prescription medicine daily to prevent HIV infection. This is called pre-exposure prophylaxis (PrEP). You are considered at risk if:  You are sexually active and do not regularly use condoms or know the HIV  status of your partner(s).  You take drugs by injection.  You are sexually active with a partner who has HIV. Talk with your health care provider about whether you are at high risk of being infected with HIV. If you choose to begin PrEP, you should first be tested for HIV. You should then be tested every 3 months for as long as you are taking PrEP. Pregnancy  If you are premenopausal and you may become pregnant, ask your health care provider about preconception counseling.  If you may become pregnant, take 400 to 800 micrograms (mcg) of folic acid every day.  If you want to prevent pregnancy, talk to your health care provider about birth control (contraception). Osteoporosis and menopause  Osteoporosis is a disease in which the bones lose minerals and strength with aging. This can result in serious bone fractures. Your risk for osteoporosis can be identified using a bone density scan.  If you are 18 years of age or older, or if you are at risk for osteoporosis and fractures, ask your health care provider if you should be screened.  Ask your health care provider whether you should take a calcium or vitamin D supplement to lower your risk for osteoporosis.  Menopause may have certain physical symptoms and risks.  Hormone replacement therapy may reduce some of these symptoms  and risks. Talk to your health care provider about whether hormone replacement therapy is right for you. Follow these instructions at home:  Schedule regular health, dental, and eye exams.  Stay current with your immunizations.  Do not use any tobacco products including cigarettes, chewing tobacco, or electronic cigarettes.  If you are pregnant, do not drink alcohol.  If you are breastfeeding, limit how much and how often you drink alcohol.  Limit alcohol intake to no more than 1 drink per day for nonpregnant women. One drink equals 12 ounces of beer, 5 ounces of wine, or 1 ounces of hard liquor.  Do not use street drugs.  Do not share needles.  Ask your health care provider for help if you need support or information about quitting drugs.  Tell your health care provider if you often feel depressed.  Tell your health care provider if you have ever been abused or do not feel safe at home. This information is not intended to replace advice given to you by your health care provider. Make sure you discuss any questions you have with your health care provider. Document Released: 05/01/2011 Document Revised: 03/23/2016 Document Reviewed: 07/20/2015  2017 [employer]  Health Maintenance for Postmenopausal Women Introduction Menopause is a normal process in which your reproductive ability comes to an end. This process happens gradually over a span of months to years, usually between the ages of 13 and 41. Menopause is complete when you have missed 12 consecutive menstrual periods. It is important to talk with your health care provider about some of the most common conditions that affect postmenopausal women, such as heart disease, cancer, and bone loss (osteoporosis). Adopting a healthy lifestyle and getting preventive care can help to promote your health and wellness. Those actions can also lower your chances of developing some of these common conditions. What should I know about  menopause? During menopause, you may experience a number of symptoms, such as:  Moderate-to-severe hot flashes.  Night sweats.  Decrease in sex drive.  Mood swings.  Headaches.  Tiredness.  Irritability.  Memory problems.  Insomnia. Choosing to treat or not to treat menopausal changes is an  individual decision that you make with your health care provider. What should I know about hormone replacement therapy and supplements? Hormone therapy products are effective for treating symptoms that are associated with menopause, such as hot flashes and night sweats. Hormone replacement carries certain risks, especially as you become older. If you are thinking about using estrogen or estrogen with progestin treatments, discuss the benefits and risks with your health care provider. What should I know about heart disease and stroke? Heart disease, heart attack, and stroke become more likely as you age. This may be due, in part, to the hormonal changes that your body experiences during menopause. These can affect how your body processes dietary fats, triglycerides, and cholesterol. Heart attack and stroke are both medical emergencies. There are many things that you can do to help prevent heart disease and stroke:  Have your blood pressure checked at least every 1-2 years. High blood pressure causes heart disease and increases the risk of stroke.  If you are 88-39 years old, ask your health care provider if you should take aspirin to prevent a heart attack or a stroke.  Do not use any tobacco products, including cigarettes, chewing tobacco, or electronic cigarettes. If you need help quitting, ask your health care provider.  It is important to eat a healthy diet and maintain a healthy weight.  Be sure to include plenty of vegetables, fruits, low-fat dairy products, and lean protein.  Avoid eating foods that are high in solid fats, added sugars, or salt (sodium).  Get regular exercise. This is  one of the most important things that you can do for your health.  Try to exercise for at least 150 minutes each week. The type of exercise that you do should increase your heart rate and make you sweat. This is known as moderate-intensity exercise.  Try to do strengthening exercises at least twice each week. Do these in addition to the moderate-intensity exercise.  Know your numbers.Ask your health care provider to check your cholesterol and your blood glucose. Continue to have your blood tested as directed by your health care provider. What should I know about cancer screening? There are several types of cancer. Take the following steps to reduce your risk and to catch any cancer development as early as possible. Breast Cancer  Practice breast self-awareness.  This means understanding how your breasts normally appear and feel.  It also means doing regular breast self-exams. Let your health care provider know about any changes, no matter how small.  If you are 64 or older, have a clinician do a breast exam (clinical breast exam or CBE) every year. Depending on your age, family history, and medical history, it may be recommended that you also have a yearly breast X-ray (mammogram).  If you have a family history of breast cancer, talk with your health care provider about genetic screening.  If you are at high risk for breast cancer, talk with your health care provider about having an MRI and a mammogram every year.  Breast cancer (BRCA) gene test is recommended for women who have family members with BRCA-related cancers. Results of the assessment will determine the need for genetic counseling and BRCA1 and for BRCA2 testing. BRCA-related cancers include these types:  Breast. This occurs in males or females.  Ovarian.  Tubal. This may also be called fallopian tube cancer.  Cancer of the abdominal or pelvic lining (peritoneal cancer).  Prostate.  Pancreatic. Cervical, Uterine, and  Ovarian Cancer  Your health care  provider may recommend that you be screened regularly for cancer of the pelvic organs. These include your ovaries, uterus, and vagina. This screening involves a pelvic exam, which includes checking for microscopic changes to the surface of your cervix (Pap test).  For women ages 21-65, health care providers may recommend a pelvic exam and a Pap test every three years. For women ages 82-65, they may recommend the Pap test and pelvic exam, combined with testing for human papilloma virus (HPV), every five years. Some types of HPV increase your risk of cervical cancer. Testing for HPV may also be done on women of any age who have unclear Pap test results.  Other health care providers may not recommend any screening for nonpregnant women who are considered low risk for pelvic cancer and have no symptoms. Ask your health care provider if a screening pelvic exam is right for you.  If you have had past treatment for cervical cancer or a condition that could lead to cancer, you need Pap tests and screening for cancer for at least 20 years after your treatment. If Pap tests have been discontinued for you, your risk factors (such as having a new sexual partner) need to be reassessed to determine if you should start having screenings again. Some women have medical problems that increase the chance of getting cervical cancer. In these cases, your health care provider may recommend that you have screening and Pap tests more often.  If you have a family history of uterine cancer or ovarian cancer, talk with your health care provider about genetic screening.  If you have vaginal bleeding after reaching menopause, tell your health care provider.  There are currently no reliable tests available to screen for ovarian cancer. Lung Cancer  Lung cancer screening is recommended for adults 40-65 years old who are at high risk for lung cancer because of a history of smoking. A yearly low-dose  CT scan of the lungs is recommended if you:  Currently smoke.  Have a history of at least 30 pack-years of smoking and you currently smoke or have quit within the past 15 years. A pack-year is smoking an average of one pack of cigarettes per day for one year. Yearly screening should:  Continue until it has been 15 years since you quit.  Stop if you develop a health problem that would prevent you from having lung cancer treatment. Colorectal Cancer  This type of cancer can be detected and can often be prevented.  Routine colorectal cancer screening usually begins at age 38 and continues through age 18.  If you have risk factors for colon cancer, your health care provider may recommend that you be screened at an earlier age.  If you have a family history of colorectal cancer, talk with your health care provider about genetic screening.  Your health care provider may also recommend using home test kits to check for hidden blood in your stool.  A small camera at the end of a tube can be used to examine your colon directly (sigmoidoscopy or colonoscopy). This is done to check for the earliest forms of colorectal cancer.  Direct examination of the colon should be repeated every 5-10 years until age 57. However, if early forms of precancerous polyps or small growths are found or if you have a family history or genetic risk for colorectal cancer, you may need to be screened more often. Skin Cancer  Check your skin from head to toe regularly.  Monitor any moles. Be sure  to tell your health care provider:  About any new moles or changes in moles, especially if there is a change in a mole's shape or color.  If you have a mole that is larger than the size of a pencil eraser.  If any of your family members has a history of skin cancer, especially at a young age, talk with your health care provider about genetic screening.  Always use sunscreen. Apply sunscreen liberally and repeatedly  throughout the day.  Whenever you are outside, protect yourself by wearing long sleeves, pants, a wide-brimmed hat, and sunglasses. What should I know about osteoporosis? Osteoporosis is a condition in which bone destruction happens more quickly than new bone creation. After menopause, you may be at an increased risk for osteoporosis. To help prevent osteoporosis or the bone fractures that can happen because of osteoporosis, the following is recommended:  If you are 76-34 years old, get at least 1,000 mg of calcium and at least 600 mg of vitamin D per day.  If you are older than age 36 but younger than age 7, get at least 1,200 mg of calcium and at least 600 mg of vitamin D per day.  If you are older than age 49, get at least 1,200 mg of calcium and at least 800 mg of vitamin D per day. Smoking and excessive alcohol intake increase the risk of osteoporosis. Eat foods that are rich in calcium and vitamin D, and do weight-bearing exercises several times each week as directed by your health care provider. What should I know about how menopause affects my mental health? Depression may occur at any age, but it is more common as you become older. Common symptoms of depression include:  Low or sad mood.  Changes in sleep patterns.  Changes in appetite or eating patterns.  Feeling an overall lack of motivation or enjoyment of activities that you previously enjoyed.  Frequent crying spells. Talk with your health care provider if you think that you are experiencing depression. What should I know about immunizations? It is important that you get and maintain your immunizations. These include:  Tetanus, diphtheria, and pertussis (Tdap) booster vaccine.  Influenza every year before the flu season begins.  Pneumonia vaccine.  Shingles vaccine. Your health care provider may also recommend other immunizations. This information is not intended to replace advice given to you by your health care  provider. Make sure you discuss any questions you have with your health care provider. Document Released: 12/08/2005 Document Revised: 05/05/2016 Document Reviewed: 07/20/2015  2017 [employer] Advance Directive Advance directives are the legal documents that allow you to make choices about your health care and medical treatment if you cannot speak for yourself. Advance directives are a way for you to communicate your wishes to family, friends, and health care providers. The specified people can then convey your decisions about end-of-life care to avoid confusion if you should become unable to communicate. Ideally, the process of discussing and writing advance directives should happen over time rather than making decisions all at once. Advance directives can be modified as your situation changes, and you can change your mind at any time, even after you have signed the advance directives. Each state has its own laws regarding advance directives. You may want to check with your health care provider, attorney, or state representative about the law in your state. Below are some examples of advance directives. HEALTH CARE PROXY AND DURABLE POWER OF ATTORNEY FOR HEALTH CARE A health care proxy  is a person (agent) appointed to make medical decisions for you if you cannot. Generally, people choose someone they know well and trust to represent their preferences when they can no longer do so. You should be sure to ask this person for agreement to act as your agent. An agent may have to exercise judgment in the event of a medical decision for which your wishes are not known. A durable power of attorney for health care is a legal document that names your health care proxy. Depending on the laws in your state, after the document is written, it may also need to be:  Signed.  Notarized.  Dated.  Copied.  Witnessed.  Incorporated into your medical record. You may also want to appoint someone to manage your  financial affairs if you cannot. This is called a durable power of attorney for finances. It is a separate legal document from the durable power of attorney for health care. You may choose the same person or someone different from your health care proxy to act as your agent in financial matters. LIVING WILL A living will is a set of instructions documenting your wishes about medical care when you cannot care for yourself. It is used if you become:  Terminally ill.  Incapacitated.  Unable to communicate.  Unable to make decisions. Items to consider in your living will include:  The use or non-use of life-sustaining equipment, such as dialysis machines and breathing machines (ventilators).  A do not resuscitate (DNR) order, which is the instruction not to use cardiopulmonary resuscitation (CPR) if breathing or heartbeat stops.  Tube feeding.  Withholding of food and fluids.  Comfort (palliative) care when the goal becomes comfort rather than a cure.  Organ and tissue donation. A living will does not give instructions about distribution of your money and property if you should pass away. It is advisable to seek the expert advice of a lawyer in drawing up a will regarding your possessions. Decisions about taxes, beneficiaries, and asset distribution will be legally binding. This process can relieve your family and friends of any burdens surrounding disputes or questions that may come up about the allocation of your assets. DO NOT RESUSCITATE (DNR) A do not resuscitate (DNR) order is a request to not have CPR in the event that your heart stops beating or you stop breathing. Unless given other instructions, a health care provider will try to help any patient whose heart has stopped or who has stopped breathing.  This information is not intended to replace advice given to you by your health care provider. Make sure you discuss any questions you have with your health care provider. Document  Released: 01/23/2008 Document Revised: 02/07/2016 Document Reviewed: 03/05/2013 Elsevier Interactive Patient Education  2017 [employer]. Influenza (Flu) Vaccine (Inactivated or Recombinant): What You Need to Know 1. Why get vaccinated? Influenza ("flu") is a contagious disease that spreads around the [place] every year, usually between October and May. Flu is caused by influenza viruses, and is spread mainly by coughing, sneezing, and close contact. Anyone can get flu. Flu strikes suddenly and can last several days. Symptoms vary by age, but can include:  fever/chills  sore throat  muscle aches  fatigue  cough  headache  runny or stuffy nose Flu can also lead to pneumonia and blood infections, and cause diarrhea and seizures in children. If you have a medical condition, such as heart or lung disease, flu can make it worse. Flu is more dangerous for  some people. Infants and young children, people 56 years of age and older, pregnant women, and people with certain health conditions or a weakened immune system are at greatest risk. Each year thousands of people in the [place] die from flu, and many more are hospitalized. Flu vaccine can:  keep you from getting flu,  make flu less severe if you do get it, and  keep you from spreading flu to your family and other people. 2. Inactivated and recombinant flu vaccines A dose of flu vaccine is recommended every flu season. Children 6 months through 37 years of age may need two doses during the same flu season. Everyone else needs only one dose each flu season. Some inactivated flu vaccines contain a very small amount of a mercury-based preservative called thimerosal. Studies have not shown thimerosal in vaccines to be harmful, but flu vaccines that do not contain thimerosal are available. There is no live flu virus in flu shots. They cannot cause the flu. There are many flu viruses, and they are always changing. Each year a new  flu vaccine is made to protect against three or four viruses that are likely to cause disease in the upcoming flu season. But even when the vaccine doesn't exactly match these viruses, it may still provide some protection. Flu vaccine cannot prevent:  flu that is caused by a virus not covered by the vaccine, or  illnesses that look like flu but are not. It takes about 2 weeks for protection to develop after vaccination, and protection lasts through the flu season. 3. Some people should not get this vaccine Tell the person who is giving you the vaccine:  If you have any severe, life-threatening allergies. If you ever had a life-threatening allergic reaction after a dose of flu vaccine, or have a severe allergy to any part of this vaccine, you may be advised not to get vaccinated. Most, but not all, types of flu vaccine contain a small amount of egg protein.  If you ever had Guillain-Barr Syndrome (also called GBS). Some people with a history of GBS should not get this vaccine. This should be discussed with your doctor.  If you are not feeling well. It is usually okay to get flu vaccine when you have a mild illness, but you might be asked to come back when you feel better. 4. Risks of a vaccine reaction With any medicine, including vaccines, there is a chance of reactions. These are usually mild and go away on their own, but serious reactions are also possible. Most people who get a flu shot do not have any problems with it. Minor problems following a flu shot include:  soreness, redness, or swelling where the shot was given  hoarseness  sore, red or itchy eyes  cough  fever  aches  headache  itching  fatigue If these problems occur, they usually begin soon after the shot and last 1 or 2 days. More serious problems following a flu shot can include the following:  There may be a small increased risk of Guillain-Barre Syndrome (GBS) after inactivated flu vaccine. This risk has  been estimated at 1 or 2 additional cases per million people vaccinated. This is much lower than the risk of severe complications from flu, which can be prevented by flu vaccine.  Young children who get the flu shot along with pneumococcal vaccine (PCV13) and/or DTaP vaccine at the same time might be slightly more likely to have a seizure caused by fever. Ask your  doctor for more information. Tell your doctor if a child who is getting flu vaccine has ever had a seizure. Problems that could happen after any injected vaccine:  People sometimes faint after a medical procedure, including vaccination. Sitting or lying down for about 15 minutes can help prevent fainting, and injuries caused by a fall. Tell your doctor if you feel dizzy, or have vision changes or ringing in the ears.  Some people get severe pain in the shoulder and have difficulty moving the arm where a shot was given. This happens very rarely.  Any medication can cause a severe allergic reaction. Such reactions from a vaccine are very rare, estimated at about 1 in a million doses, and would happen within a few minutes to a few hours after the vaccination. As with any medicine, there is a very remote chance of a vaccine causing a serious injury or death. The safety of vaccines is always being monitored. For more information, visit: http://www.aguilar.org/ 5. What if there is a serious reaction? What should I look for? Look for anything that concerns you, such as signs of a severe allergic reaction, very high fever, or unusual behavior. Signs of a severe allergic reaction can include hives, swelling of the face and throat, difficulty breathing, a fast heartbeat, dizziness, and weakness. These would start a few minutes to a few hours after the vaccination. What should I do?  If you think it is a severe allergic reaction or other emergency that can't wait, call 9-1-1 and get the person to the nearest hospital. Otherwise, call your  doctor.  Reactions should be reported to the Vaccine Adverse Event Reporting System (VAERS). Your doctor should file this report, or you can do it yourself through the VAERS web site at www.vaers.SamedayNews.es, or by calling 684-608-6297.  VAERS does not give medical advice. 6. The National Vaccine Injury Compensation Program The [employer] Vaccine Injury Compensation Program (VICP) is a federal program that was created to compensate people who may have been injured by certain vaccines. Persons who believe they may have been injured by a vaccine can learn about the program and about filing a claim by calling 6093662045 or visiting the [place] website at GoldCloset.com.ee. There is a time limit to file a claim for compensation. 7. How can I learn more?  Ask your healthcare provider. He or she can give you the vaccine package insert or suggest other sources of information.  Call your local or state health department.  Contact the Centers for Disease Control and Prevention (CDC):  Call (318) 175-9716 (1-800-CDC-INFO) or  Visit CDC's website at https://gibson.com/ Vaccine Information Statement, Inactivated Influenza Vaccine (06/05/2014) This information is not intended to replace advice given to you by your health care provider. Make sure you discuss any questions you have with your health care provider. Document Released: 08/10/2006 Document Revised: 07/06/2016 Document Reviewed: 07/06/2016 Elsevier Interactive Patient Education  2017 [employer] Pneumococcal Conjugate Vaccine (PCV13) What You Need to Know 1. Why get vaccinated? Vaccination can protect both children and adults from pneumococcal disease. Pneumococcal disease is caused by bacteria that can spread from person to person through close contact. It can cause ear infections, and it can also lead to more serious infections of the:  Lungs (pneumonia),  Blood (bacteremia), and  Covering of the brain and spinal cord  (meningitis). Pneumococcal pneumonia is most common among adults. Pneumococcal meningitis can cause deafness and brain damage, and it kills about 1 child in 10 who get it. Anyone can get pneumococcal disease,  but children under 55 years of age and adults 73 years and older, people with certain medical conditions, and cigarette smokers are at the highest risk. Before there was a vaccine, the [place] saw:  more than 700 cases of meningitis,  about 13,000 blood infections,  about 5 million ear infections, and  about 200 deaths in children under 5 each year from pneumococcal disease. Since vaccine became available, severe pneumococcal disease in these children has fallen by 88%. About 18,000 older adults die of pneumococcal disease each year in the [place]. Treatment of pneumococcal infections with penicillin and other drugs is not as effective as it used to be, because some strains of the disease have become resistant to these drugs. This makes prevention of the disease, through vaccination, even more important. 2. PCV13 vaccine Pneumococcal conjugate vaccine (called PCV13) protects against 13 types of pneumococcal bacteria. PCV13 is routinely given to children at 2, 4, 6, and 45-45 months of age. It is also recommended for children and adults 51 to 40 years of age with certain health conditions, and for all adults 45 years of age and older. Your doctor can give you details. 3. Some people should not get this vaccine Anyone who has ever had a life-threatening allergic reaction to a dose of this vaccine, to an earlier pneumococcal vaccine called PCV7, or to any vaccine containing diphtheria toxoid (for example, DTaP), should not get PCV13. Anyone with a severe allergy to any component of PCV13 should not get the vaccine. Tell your doctor if the person being vaccinated has any severe allergies. If the person scheduled for vaccination is not feeling well, your healthcare provider might  decide to reschedule the shot on another day. 4. Risks of a vaccine reaction With any medicine, including vaccines, there is a chance of reactions. These are usually mild and go away on their own, but serious reactions are also possible. Problems reported following PCV13 varied by age and dose in the series. The most common problems reported among children were:  About half became drowsy after the shot, had a temporary loss of appetite, or had redness or tenderness where the shot was given.  About 1 out of 3 had swelling where the shot was given.  About 1 out of 3 had a mild fever, and about 1 in 20 had a fever over 102.2F.  Up to about 8 out of 10 became fussy or irritable. Adults have reported pain, redness, and swelling where the shot was given; also mild fever, fatigue, headache, chills, or muscle pain. Young children who get PCV13 along with inactivated flu vaccine at the same time may be at increased risk for seizures caused by fever. Ask your doctor for more information. Problems that could happen after any vaccine:  People sometimes faint after a medical procedure, including vaccination. Sitting or lying down for about 15 minutes can help prevent fainting, and injuries caused by a fall. Tell your doctor if you feel dizzy, or have vision changes or ringing in the ears.  Some older children and adults get severe pain in the shoulder and have difficulty moving the arm where a shot was given. This happens very rarely.  Any medication can cause a severe allergic reaction. Such reactions from a vaccine are very rare, estimated at about 1 in a million doses, and would happen within a few minutes to a few hours after the vaccination. As with any medicine, there is a very small chance of a vaccine causing a serious  injury or death. The safety of vaccines is always being monitored. For more information, visit: http://www.aguilar.org/ 5. What if there is a serious reaction? What should I  look for? Look for anything that concerns you, such as signs of a severe allergic reaction, very high fever, or unusual behavior. Signs of a severe allergic reaction can include hives, swelling of the face and throat, difficulty breathing, a fast heartbeat, dizziness, and weakness-usually within a few minutes to a few hours after the vaccination. What should I do?  If you think it is a severe allergic reaction or other emergency that can't wait, call 9-1-1 or get the person to the nearest hospital. Otherwise, call your doctor.  Reactions should be reported to the Vaccine Adverse Event Reporting System (VAERS). Your doctor should file this report, or you can do it yourself through the VAERS web site at www.vaers.SamedayNews.es, or by calling 567-675-8525.  VAERS does not give medical advice. 6. The National Vaccine Injury Compensation Program The [employer] Vaccine Injury Compensation Program (VICP) is a federal program that was created to compensate people who may have been injured by certain vaccines. Persons who believe they may have been injured by a vaccine can learn about the program and about filing a claim by calling 3151543776 or visiting the [place] website at GoldCloset.com.ee. There is a time limit to file a claim for compensation. 7. How can I learn more?  Ask your healthcare provider. He or she can give you the vaccine package insert or suggest other sources of information.  Call your local or state health department.  Contact the Centers for Disease Control and Prevention (CDC):  Call 639 802 2417 (1-800-CDC-INFO) or  Visit CDC's website at http://hunter.com/ Vaccine Information Statement, PCV13 Vaccine (09/03/2014) This information is not intended to replace advice given to you by your health care provider. Make sure you discuss any questions you have with your health care provider. Document Released: 08/13/2006 Document Revised: 07/06/2016 Document Reviewed:  07/06/2016 Elsevier Interactive Patient Education  2017 [employer].

## 2016-11-24 NOTE — Progress Notes (Signed)
BP (!) 143/87   Pulse 71   Temp 98.6 F (37 C) (Oral)   Ht 5' 9.69" (1.77 m)   Wt 202 lb (91.6 kg)   SpO2 100%   BMI 29.25 kg/m    Subjective:    Patient ID: Mckenzie Webster, female    DOB: [birth year], 66 y.o.   MRN: [id number]  HPI: Mckenzie Webster is a 66 y.o. female presenting on 11/24/2016 for comprehensive medical examination. Current medical complaints include: Binge eating- getting worse. Feels like the vyvanse isn't working as well as it had been. Starting to feel bad again. Otherwise feeling well with no other concerns or compliants.   She currently lives with: alone Menopausal Symptoms: no  Functional Status Survey: Is the patient deaf or have difficulty hearing?: No Does the patient have difficulty seeing, even when wearing glasses/contacts?: No Does the patient have difficulty concentrating, remembering, or making decisions?: No Does the patient have difficulty walking or climbing stairs?: No Does the patient have difficulty dressing or bathing?: No Does the patient have difficulty doing errands alone such as visiting a doctor's office or shopping?: No  Fall Risk  11/24/2016 10/13/2016  Falls in the past year? No No    Depression Screen Depression screen [facility] 2/9 11/24/2016 10/13/2016 01/04/2016  Decreased Interest 0 0 2  Down, Depressed, Hopeless 0 0 2  PHQ - 2 Score 0 0 4  Altered sleeping - - 2  Tired, decreased energy - - 3  Change in appetite - - 3  Feeling bad or failure about yourself  - - 3  Trouble concentrating - - 1  Moving slowly or fidgety/restless - - 0  Suicidal thoughts - - 0  PHQ-9 Score - - 16  Difficult doing work/chores - - Somewhat difficult    Advanced Directives Does patient have a HCPOA?    no If yes, name and contact information:  Does patient have a living will or MOST form?  no  Past Medical History:  Past Medical History:  Diagnosis Date  . Anemia   . Cataract   . Dry eye syndrome   . Stomach ulcer     Surgical History:    Past Surgical History:  Procedure Laterality Date  . EYE SURGERY     Cataract  . FOOT SURGERY     Change the alignment of the toes to stop the formation of corns  . gastric bypass  1981    Medications:  Current Outpatient Prescriptions on File Prior to Visit  Medication Sig  . Calcium-Vitamin D-Vitamin K (CALCIUM + D + K PO) Take by mouth. 750mg  calcium, 500 vitamin d and 40mg   . ferrous sulfate 325 (65 FE) MG EC tablet Take 1 tablet (325 mg total) by mouth 3 (three) times daily with meals. (Patient taking differently: Take 325 mg by mouth 2 (two) times daily. )  . lisdexamfetamine (VYVANSE) 30 MG capsule Take 1 capsule (30 mg total) by mouth daily.  . Multiple Vitamins-Minerals (CENTRUM SILVER PO) Take by mouth.  . naproxen sodium (ANAPROX) 220 MG tablet Take 440 mg by mouth daily.  [name] Kitchen OVER THE COUNTER MEDICATION More Free Ultra- Joint health  . ranitidine (ZANTAC) 150 MG tablet Take 150 mg by mouth 2 (two) times daily.   . traZODone (DESYREL) 50 MG tablet Take 0.5-1 tablets (25-50 mg total) by mouth at bedtime as needed for sleep.   No current facility-administered medications on file prior to visit.     Allergies:  No  Known Allergies  Social History:  Social History   Social History  . Marital status: Legally Separated    Spouse name: N/A  . Number of children: N/A  . Years of education: N/A   Occupational History  . Not on file.   Social History Main Topics  . Smoking status: Former Smoker    Quit date: 10/31/1995  . Smokeless tobacco: Former Systems developer  . Alcohol use Yes     Comment: on occasion  . Drug use: No  . Sexual activity: Not Currently   Other Topics Concern  . Not on file   Social History Narrative  . No narrative on file   History  Smoking Status  . Former Smoker  . Quit date: 10/31/1995  Smokeless Tobacco  . Former User   History  Alcohol Use  . Yes    Comment: on occasion    Family History:  Family History  Problem Relation Age of Onset   . Heart disease Mother   . COPD Mother   . Heart disease Father   . Hypertension Father   . Heart disease Maternal Grandfather   . Alzheimer's disease Paternal Grandmother     Past medical history, surgical history, medications, allergies, family history and social history reviewed with patient today and changes made to appropriate areas of the chart.   Review of Systems  Constitutional: Negative.   HENT: Negative.   Eyes: Negative.   Respiratory: Negative.   Cardiovascular: Negative.   Gastrointestinal: Negative.   Genitourinary: Negative.   Musculoskeletal: Negative.   Skin: Negative.   Neurological: Positive for tingling (better with shaking it out or changing position). Negative for dizziness, tremors, sensory change, speech change, focal weakness, seizures, loss of consciousness and headaches.  Endo/Heme/Allergies: Negative.   Psychiatric/Behavioral: Negative.     All other ROS negative except what is listed above and in the HPI.      Objective:    BP (!) 143/87   Pulse 71   Temp 98.6 F (37 C) (Oral)   Ht 5' 9.69" (1.77 m)   Wt 202 lb (91.6 kg)   SpO2 100%   BMI 29.25 kg/m   Wt Readings from Last 3 Encounters:  11/24/16 202 lb (91.6 kg)  10/13/16 198 lb 3.2 oz (89.9 kg)  06/21/16 187 lb (84.8 kg)     Hearing Screening   125Hz  250Hz  500Hz  1000Hz  2000Hz  3000Hz  4000Hz  6000Hz  8000Hz   Right ear:   40 0 40 40 40    Left ear:   40 40 40 40 40      Visual Acuity Screening   Right eye Left eye Both eyes  Without correction: 20/20 20/25 20/15   With correction:       Physical Exam  Constitutional: She is oriented to person, place, and time. She appears well-developed and well-nourished. No distress.  HENT:  Head: Normocephalic and atraumatic.  Right Ear: Hearing, tympanic membrane, external ear and ear canal normal.  Left Ear: Hearing, tympanic membrane, external ear and ear canal normal.  Nose: Nose normal.  Mouth/Throat: Uvula is midline, oropharynx is  clear and moist and mucous membranes are normal. No oropharyngeal exudate.  Eyes: Conjunctivae, EOM and lids are normal. Pupils are equal, round, and reactive to light. Right eye exhibits no discharge. Left eye exhibits no discharge. No scleral icterus.  Neck: Normal range of motion. Neck supple. No JVD present. No tracheal deviation present. No thyromegaly present.  Cardiovascular: Normal rate, regular rhythm, normal heart sounds and intact distal  pulses.  Exam reveals no gallop and no friction rub.   No murmur heard. Pulmonary/Chest: Effort normal and breath sounds normal. No stridor. No respiratory distress. She has no wheezes. She has no rales. She exhibits no tenderness. Right breast exhibits no inverted nipple, no mass, no nipple discharge, no skin change and no tenderness. Left breast exhibits no inverted nipple, no mass, no nipple discharge, no skin change and no tenderness. Breasts are symmetrical.  Abdominal: Soft. Bowel sounds are normal. She exhibits no distension and no mass. There is no tenderness. There is no rebound and no guarding.  Genitourinary: Vagina normal and uterus normal. No vaginal discharge found.  Musculoskeletal: Normal range of motion. She exhibits no edema, tenderness or deformity.  Lymphadenopathy:    She has no cervical adenopathy.  Neurological: She is alert and oriented to person, place, and time. She has normal reflexes. She displays normal reflexes. No cranial nerve deficit. She exhibits normal muscle tone. Coordination normal.  Skin: Skin is warm, dry and intact. No rash noted. She is not diaphoretic. No erythema. No pallor.  Psychiatric: She has a normal mood and affect. Her speech is normal and behavior is normal. Judgment and thought content normal. Cognition and memory are normal.  Nursing note and vitals reviewed.    6CIT Screen 11/24/2016  What Year? 0 points  What month? 0 points  What time? 0 points  Count back from 20 0 points  Months in reverse 0  points  Repeat phrase 0 points  Total Score 0      Results for orders placed or performed in visit on 11/24/16  Bayer DCA Hb A1c Waived  Result Value Ref Range   Bayer DCA Hb A1c Waived 5.5 <7.0 %  CBC with Differential/Platelet  Result Value Ref Range   WBC 4.1 3.4 - 10.8 x10E3/uL   RBC 4.64 3.77 - 5.28 x10E6/uL   Hemoglobin 13.7 11.1 - 15.9 g/dL   Hematocrit 42.4 34.0 - 46.6 %   MCV 91 79 - 97 fL   MCH 29.5 26.6 - 33.0 pg   MCHC 32.3 31.5 - 35.7 g/dL   RDW 14.3 12.3 - 15.4 %   Platelets 252 150 - 379 x10E3/uL   Neutrophils 62 Not Estab. %   Lymphs 26 Not Estab. %   Monocytes 9 Not Estab. %   Eos 3 Not Estab. %   Basos 0 Not Estab. %   Neutrophils Absolute 2.5 1.4 - 7.0 x10E3/uL   Lymphocytes Absolute 1.1 0.7 - 3.1 x10E3/uL   Monocytes Absolute 0.4 0.1 - 0.9 x10E3/uL   EOS (ABSOLUTE) 0.1 0.0 - 0.4 x10E3/uL   Basophils Absolute 0.0 0.0 - 0.2 x10E3/uL   Immature Granulocytes 0 Not Estab. %   Immature Grans (Abs) 0.0 0.0 - 0.1 x10E3/uL  Comprehensive metabolic panel  Result Value Ref Range   Glucose 89 65 - 99 mg/dL   BUN 20 8 - 27 mg/dL   Creatinine, Ser 0.83 0.57 - 1.00 mg/dL   GFR calc non Af Amer 74 >59 mL/min/1.73   GFR calc Af Amer 86 >59 mL/min/1.73   BUN/Creatinine Ratio 24 12 - 28   Sodium 145 (H) 134 - 144 mmol/L   Potassium 4.8 3.5 - 5.2 mmol/L   Chloride 104 96 - 106 mmol/L   CO2 26 18 - 29 mmol/L   Calcium 9.5 8.7 - 10.3 mg/dL   Total Protein 6.7 6.0 - 8.5 g/dL   Albumin 4.2 3.6 - 4.8 g/dL   Globulin, Total  2.5 1.5 - 4.5 g/dL   Albumin/Globulin Ratio 1.7 1.2 - 2.2   Bilirubin Total 0.4 0.0 - 1.2 mg/dL   Alkaline Phosphatase 148 (H) 39 - 117 IU/L   AST 22 0 - 40 IU/L   ALT 15 0 - 32 IU/L  HIV antibody  Result Value Ref Range   HIV Screen 4th Generation wRfx Non Reactive Non Reactive  Lipid Panel w/o Chol/HDL Ratio  Result Value Ref Range   Cholesterol, Total 194 100 - 199 mg/dL   Triglycerides 63 0 - 149 mg/dL   HDL 84 >39 mg/dL   VLDL  Cholesterol Cal 13 5 - 40 mg/dL   LDL Calculated 97 0 - 99 mg/dL  UA/M w/rflx Culture, Routine  Result Value Ref Range   Specific Gravity, UA 1.020 1.005 - 1.030   pH, UA 7.0 5.0 - 7.5   Color, UA Yellow Yellow   Appearance Ur Clear Clear   Leukocytes, UA Negative Negative   Protein, UA Negative Negative/Trace   Glucose, UA Negative Negative   Ketones, UA Negative Negative   RBC, UA Negative Negative   Bilirubin, UA Negative Negative   Urobilinogen, Ur 0.2 0.2 - 1.0 mg/dL   Nitrite, UA Negative Negative  Hepatitis C Antibody  Result Value Ref Range   Hep C Virus Ab <0.1 0.0 - 0.9 s/co ratio  TSH  Result Value Ref Range   TSH 4.520 (H) 0.450 - 4.500 uIU/mL      Assessment & Plan:   Problem List Items Addressed This Visit      Other   Iron deficiency anemia    Rechecking levels today. Await results and treat as needed.       Relevant Orders   CBC with Differential/Platelet (Completed)   Binge eating disorder    Not doing great on her current regimen. Will increase her vyvanse and recheck in 1 month.        Other Visit Diagnoses    Welcome to Medicare preventive visit    -  Primary   Preventative care discussed as below. Continue diet and exercise. Call with any concerns.    Relevant Orders   EKG 12-Lead (Completed)   US ABDOMINAL AORTA SCREENING AAA   Screening for cholesterol level       Labs checked today. Await results.    Relevant Orders   Lipid Panel w/o Chol/HDL Ratio (Completed)   Screening for diabetes mellitus (DM)       Labs checked today. Await results.    Relevant Orders   Bayer DCA Hb A1c Waived (Completed)   Screening for HIV without presence of risk factors       Labs checked today. Await results.    Relevant Orders   HIV antibody (Completed)   Need for hepatitis C screening test       Labs checked today. Await results.    Relevant Orders   Hepatitis C Antibody (Completed)   Screening for cervical cancer       Pap done today. Await results.     Relevant Orders   IGP, Aptima HPV, rfx 16/18,45   Overweight       Discussed diet and exercise. Continue vyvanse. Continue to monitor.    Relevant Orders   Comprehensive metabolic panel (Completed)   TSH (Completed)   Bilateral low back pain without sciatica, unspecified chronicity       Will check UA. Await results.    Relevant Orders   UA/M w/rflx Culture, Routine (Completed)  Family history of abdominal aortic aneurysm (AAA)       Mom had AAA- will obtain US. Ordered today.   Relevant Orders   US ABDOMINAL AORTA SCREENING AAA   Screening for osteoporosis       DEXA ordered today.   Relevant Orders   DG Bone Density   Screening for colon cancer       Will do cologuard- ordered today.   Relevant Orders   Cologuard   Immunization due       Flu and pneumovax given today.   Relevant Orders   Flu Vaccine QUAD 36+ mos PF IM (Fluarix & Fluzone Quad PF) (Completed)   Pneumococcal conjugate vaccine 13-valent (Completed)   Screening for breast cancer       Mammogram ordered today.   Relevant Orders   MM DIGITAL SCREENING BILATERAL       Preventative Services:  AAA screening:  Health Risk Assessment and Personalized Prevention Plan: Done today Bone Mass Measurements: Ordered today Breast Cancer Screening: Ordered today CVD Screening: Done today Cervical Cancer Screening: Done today Colon Cancer Screening: Ordered today Depression Screening: Done today Diabetes Screening: Done today Glaucoma Screening: See your Eye doctor Hepatitis B vaccine: N/A Hepatitis C screening: Ordered today HIV Screening: Ordered today Flu Vaccine: Done today Lung cancer Screening: N/A Obesity Screening: Done today Pneumonia Vaccines (2): Prevnar given today, Pneumovax next year STI Screening: N/A  Follow up plan: Return As scheduled.   LABORATORY TESTING:  - Pap smear: pap done  IMMUNIZATIONS:  - Tdap: Tetanus vaccination status reviewed: last tetanus booster within 10 years. -  Influenza: Administered today - Pneumovax: Not applicable - Prevnar: Administered today - Zostavax vaccine: Rx given for vaccine today  SCREENING: -Mammogram: Ordered today  - Colonoscopy: Cologuard ordered  - Bone Density:Ordered today  -Hearing Test: Ordered today  -Spirometry: Not applicable PATIENT COUNSELING:   Advised to take 1 mg of folate supplement per day if capable of pregnancy.   Sexuality: Discussed sexually transmitted diseases, partner selection, use of condoms, avoidance of unintended pregnancy  and contraceptive alternatives.   Advised to avoid cigarette smoking.  I discussed with the patient that most people either abstain from alcohol or drink within safe limits (<=14/week and <=4 drinks/occasion for males, <=7/weeks and <= 3 drinks/occasion for females) and that the risk for alcohol disorders and other health effects rises proportionally with the number of drinks per week and how often a drinker exceeds daily limits.  Discussed cessation/primary prevention of drug use and availability of treatment for abuse.   Diet: Encouraged to adjust caloric intake to maintain  or achieve ideal body weight, to reduce intake of dietary saturated fat and total fat, to limit sodium intake by avoiding high sodium foods and not adding table salt, and to maintain adequate dietary potassium and calcium preferably from fresh fruits, vegetables, and low-fat dairy products.    stressed the importance of regular exercise  Injury prevention: Discussed safety belts, safety helmets, smoke detector, smoking near bedding or upholstery.   Dental health: Discussed importance of regular tooth brushing, flossing, and dental visits.    NEXT PREVENTATIVE PHYSICAL DUE IN 1 YEAR. Return As scheduled.

## 2016-11-25 LAB — COMPREHENSIVE METABOLIC PANEL
A/G RATIO: 1.7 (ref 1.2–2.2)
ALBUMIN: 4.2 g/dL (ref 3.6–4.8)
ALT: 15 IU/L (ref 0–32)
AST: 22 IU/L (ref 0–40)
Alkaline Phosphatase: 148 IU/L — ABNORMAL HIGH (ref 39–117)
BILIRUBIN TOTAL: 0.4 mg/dL (ref 0.0–1.2)
BUN / CREAT RATIO: 24 (ref 12–28)
BUN: 20 mg/dL (ref 8–27)
CHLORIDE: 104 mmol/L (ref 96–106)
CO2: 26 mmol/L (ref 18–29)
Calcium: 9.5 mg/dL (ref 8.7–10.3)
Creatinine, Ser: 0.83 mg/dL (ref 0.57–1.00)
GFR calc non Af Amer: 74 mL/min/{1.73_m2} (ref >59)
GFR, EST AFRICAN AMERICAN: 86 mL/min/{1.73_m2} (ref >59)
GLOBULIN, TOTAL: 2.5 g/dL (ref 1.5–4.5)
Glucose: 89 mg/dL (ref 65–99)
POTASSIUM: 4.8 mmol/L (ref 3.5–5.2)
SODIUM: 145 mmol/L — AB (ref 134–144)
TOTAL PROTEIN: 6.7 g/dL (ref 6.0–8.5)

## 2016-11-25 LAB — CBC WITH DIFFERENTIAL/PLATELET
BASOS: 0 %
Basophils Absolute: 0 10*3/uL (ref 0.0–0.2)
EOS (ABSOLUTE): 0.1 10*3/uL (ref 0.0–0.4)
EOS: 3 %
HEMATOCRIT: 42.4 % (ref 34.0–46.6)
HEMOGLOBIN: 13.7 g/dL (ref 11.1–15.9)
IMMATURE GRANS (ABS): 0 10*3/uL (ref 0.0–0.1)
Immature Granulocytes: 0 %
LYMPHS: 26 %
Lymphocytes Absolute: 1.1 10*3/uL (ref 0.7–3.1)
MCH: 29.5 pg (ref 26.6–33.0)
MCHC: 32.3 g/dL (ref 31.5–35.7)
MCV: 91 fL (ref 79–97)
Monocytes Absolute: 0.4 10*3/uL (ref 0.1–0.9)
Monocytes: 9 %
NEUTROS ABS: 2.5 10*3/uL (ref 1.4–7.0)
Neutrophils: 62 %
PLATELETS: 252 10*3/uL (ref 150–379)
RBC: 4.64 x10E6/uL (ref 3.77–5.28)
RDW: 14.3 % (ref 12.3–15.4)
WBC: 4.1 10*3/uL (ref 3.4–10.8)

## 2016-11-25 LAB — HIV ANTIBODY (ROUTINE TESTING W REFLEX): HIV Screen 4th Generation wRfx: NONREACTIVE

## 2016-11-25 LAB — HEPATITIS C ANTIBODY

## 2016-11-25 LAB — LIPID PANEL W/O CHOL/HDL RATIO
Cholesterol, Total: 194 mg/dL (ref 100–199)
HDL: 84 mg/dL (ref >39)
LDL CALC: 97 mg/dL (ref 0–99)
Triglycerides: 63 mg/dL (ref 0–149)
VLDL Cholesterol Cal: 13 mg/dL (ref 5–40)

## 2016-11-25 LAB — TSH: TSH: 4.52 u[IU]/mL — AB (ref 0.450–4.500)

## 2016-11-27 ENCOUNTER — Telehealth: Payer: Self-pay | Admitting: Family Medicine

## 2016-11-27 DIAGNOSIS — R7989 Other specified abnormal findings of blood chemistry: Secondary | ICD-10-CM

## 2016-11-27 NOTE — Telephone Encounter (Signed)
Called and let patient know that her labs looked normal except her thyroid which was off a bit. Will recheck it at her follow up. Order in.

## 2016-11-27 NOTE — Assessment & Plan Note (Signed)
Rechecking levels today. Await results and treat as needed.  

## 2016-11-27 NOTE — Assessment & Plan Note (Signed)
Not doing great on her current regimen. Will increase her vyvanse and recheck in 1 month.

## 2016-11-28 ENCOUNTER — Telehealth: Payer: Self-pay

## 2016-11-28 NOTE — Telephone Encounter (Signed)
We can cancel it. Thanks!

## 2016-11-28 NOTE — Telephone Encounter (Signed)
She is scheduled for a AAA tomorrow, but she has a CT in may 2017 which showed her aorta and it was fine then. Radiology states that Medicare won't cover it now that she's had a prior scan. Do you want to cancel the screen AAA? Or you can change it and do a retroperitoneal u/s?

## 2016-11-28 NOTE — Telephone Encounter (Signed)
Radiology notified.

## 2016-11-28 NOTE — Telephone Encounter (Signed)
Left detailed message on patient's identified voicemail that she did not need to go to her AAA screening u/s tomorrow.

## 2016-11-28 NOTE — Telephone Encounter (Signed)
Left message for patient to call.

## 2016-11-29 ENCOUNTER — Ambulatory Visit: Payer: PPO

## 2016-11-29 LAB — IGP, APTIMA HPV, RFX 16/18,45
HPV APTIMA: NEGATIVE
PAP Smear Comment: 0

## 2016-12-01 ENCOUNTER — Telehealth: Payer: Self-pay | Admitting: Family Medicine

## 2016-12-01 DIAGNOSIS — F5081 Binge eating disorder: Secondary | ICD-10-CM

## 2016-12-01 MED ORDER — LISDEXAMFETAMINE DIMESYLATE 60 MG PO CAPS: 60.0000 mg | ORAL_CAPSULE | Freq: Every day | ORAL | 0 refills | Status: DC

## 2016-12-01 NOTE — Telephone Encounter (Signed)
Thamar called back. Doing well on her higher dose of vyvanse. Helping the binges, no side effects. Feeling well. Will come pick up higher dose Rx on Monday.   Please call pharmacy and have them destroy 30mg  dose they have on file to fill in February. Thanks!

## 2016-12-01 NOTE — Telephone Encounter (Signed)
-----   Message from Valerie Roys, DO sent at 11/24/2016 10:15 AM EST ----- Call to check on her vyvanse

## 2016-12-01 NOTE — Telephone Encounter (Signed)
Called to check on how she's feeling on the higher dose of her vyvanse. LMOM for her to call back.

## 2016-12-12 NOTE — Telephone Encounter (Signed)
Called and spoke with pharmacy tech. She stated that they still had the 30 mg on file but the 60 mg was ready for pick up. I asked for them to destroy the 30 mg tablet prescription and she stated that she would delete it out now.

## 2016-12-12 NOTE — Telephone Encounter (Signed)
Can we please make sure pharmacy has destroyed 30mg  dose? Thanks!

## 2016-12-17 DIAGNOSIS — Z1212 Encounter for screening for malignant neoplasm of rectum: Secondary | ICD-10-CM | POA: Diagnosis not present

## 2016-12-17 DIAGNOSIS — Z1211 Encounter for screening for malignant neoplasm of colon: Secondary | ICD-10-CM | POA: Diagnosis not present

## 2016-12-17 LAB — COLOGUARD: COLOGUARD: POSITIVE

## 2017-01-02 ENCOUNTER — Other Ambulatory Visit: Payer: Self-pay | Admitting: Family Medicine

## 2017-01-02 DIAGNOSIS — F5081 Binge eating disorder: Secondary | ICD-10-CM

## 2017-01-04 ENCOUNTER — Telehealth: Payer: Self-pay | Admitting: Family Medicine

## 2017-01-04 NOTE — Telephone Encounter (Signed)
Patient received a bill from [place] with incorrect ins filed. She states they had her old ins information.  [phone] Mckenzie Webster if we need to reach her.  Thanks

## 2017-01-06 ENCOUNTER — Other Ambulatory Visit: Payer: Self-pay | Admitting: Family Medicine

## 2017-01-06 DIAGNOSIS — F5081 Binge eating disorder: Secondary | ICD-10-CM

## 2017-01-09 NOTE — Telephone Encounter (Signed)
Left message on machine for pt to return call to the office. Mychart message sent as well.

## 2017-01-10 ENCOUNTER — Other Ambulatory Visit: Payer: PPO

## 2017-01-10 ENCOUNTER — Ambulatory Visit
Admission: RE | Admit: 2017-01-10 | Discharge: 2017-01-10 | Disposition: A | Discharge status: Home / Self Care | Payer: PPO | Source: Ambulatory Visit | Attending: Family Medicine | Admitting: Family Medicine

## 2017-01-10 DIAGNOSIS — Z13820 Encounter for screening for osteoporosis: Secondary | ICD-10-CM

## 2017-01-10 DIAGNOSIS — Z1231 Encounter for screening mammogram for malignant neoplasm of breast: Secondary | ICD-10-CM | POA: Insufficient documentation

## 2017-01-10 DIAGNOSIS — M810 Age-related osteoporosis without current pathological fracture: Secondary | ICD-10-CM | POA: Diagnosis not present

## 2017-01-10 DIAGNOSIS — Z1239 Encounter for other screening for malignant neoplasm of breast: Secondary | ICD-10-CM

## 2017-01-10 DIAGNOSIS — M818 Other osteoporosis without current pathological fracture: Secondary | ICD-10-CM | POA: Diagnosis not present

## 2017-01-12 ENCOUNTER — Other Ambulatory Visit: Payer: Self-pay | Admitting: *Deleted

## 2017-01-12 ENCOUNTER — Inpatient Hospital Stay
Admission: RE | Admit: 2017-01-12 | Discharge: 2017-01-12 | Disposition: A | Discharge status: Home / Self Care | Payer: Self-pay | Source: Ambulatory Visit | Attending: *Deleted | Admitting: *Deleted

## 2017-01-12 DIAGNOSIS — Z1231 Encounter for screening mammogram for malignant neoplasm of breast: Secondary | ICD-10-CM

## 2017-01-15 NOTE — Telephone Encounter (Signed)
Can we try her again to make an appointment? Thanks!

## 2017-01-15 NOTE — Telephone Encounter (Signed)
Called and left patient a VM asking for her to please return my call to schedule f/up visit.

## 2017-01-16 ENCOUNTER — Telehealth: Payer: Self-pay | Admitting: Family Medicine

## 2017-01-16 MED ORDER — LISDEXAMFETAMINE DIMESYLATE 60 MG PO CAPS: 60.0000 mg | ORAL_CAPSULE | Freq: Every day | ORAL | 0 refills | Status: DC

## 2017-01-16 NOTE — Telephone Encounter (Signed)
Patient has appointment scheduled on 01/31/17.

## 2017-01-16 NOTE — Telephone Encounter (Signed)
Called to give patient her results. DEXA shows osteoporosis on her forearm. Need to find out if she has ever been on a bisphosphinate before and if not will start her on weekly fosamax. OK to give her this message if she calls back.

## 2017-01-17 NOTE — Telephone Encounter (Signed)
Patient missed the call from Dr Wynetta Emery. I explained that Dr Wynetta Emery was away.    She would like for someone to give her a call.  Thanks  Mckenzie Webster

## 2017-01-17 NOTE — Telephone Encounter (Signed)
Called and spoke with patient. I let her know what Dr. Wynetta Emery said about DEXA scan. Patient stated that she has not been on a biphosphinate in the past and I told the patient that Dr. Wynetta Emery said she was going to start her on the weekly medication fosamax. Will route back to provider so medication can be sent in.

## 2017-01-19 MED ORDER — ALENDRONATE SODIUM 70 MG PO TABS
70.0000 mg | ORAL_TABLET | ORAL | 3 refills | Status: DC
Start: 2017-01-19 — End: 2017-12-17

## 2017-01-19 NOTE — Telephone Encounter (Signed)
Patient notified about medication being sent in.  

## 2017-01-19 NOTE — Telephone Encounter (Signed)
Rx sent to the pharmacy.

## 2017-01-31 ENCOUNTER — Ambulatory Visit: Payer: PPO | Admitting: Family Medicine

## 2017-02-14 ENCOUNTER — Encounter: Payer: Self-pay | Admitting: Family Medicine

## 2017-02-14 ENCOUNTER — Ambulatory Visit (INDEPENDENT_AMBULATORY_CARE_PROVIDER_SITE_OTHER): Payer: PPO | Admitting: Family Medicine

## 2017-02-14 VITALS — BP 146/80 | HR 70 | Temp 98.2°F | Wt 202.9 lb

## 2017-02-14 DIAGNOSIS — R195 Other fecal abnormalities: Secondary | ICD-10-CM

## 2017-02-14 DIAGNOSIS — F5081 Binge eating disorder: Secondary | ICD-10-CM

## 2017-02-14 DIAGNOSIS — H6121 Impacted cerumen, right ear: Secondary | ICD-10-CM | POA: Diagnosis not present

## 2017-02-14 HISTORY — DX: Other fecal abnormalities: R19.5

## 2017-02-14 MED ORDER — LISDEXAMFETAMINE DIMESYLATE 60 MG PO CAPS: 60.0000 mg | ORAL_CAPSULE | Freq: Every day | ORAL | 0 refills | Status: DC

## 2017-02-14 NOTE — Progress Notes (Signed)
BP (!) 146/80 (BP Location: Left Arm, Patient Position: Sitting, Cuff Size: Normal)   Pulse 70   Temp 98.2 F (36.8 C)   Wt 202 lb 14.4 oz (92 kg)   SpO2 99%   BMI 29.38 kg/m    Subjective:    Patient ID: Mckenzie Webster, female    DOB: [birth year], 67 y.o.   MRN: [id number]  HPI: Mckenzie Webster is a 66 y.o. female  Chief Complaint  Patient presents with  . Eating Disorder  . Hearing Problem    Patient states that her right ear was clogged starting on Easter, it lasted for about two weeks with severe hearing loss, has cleared uo over the last couple days.    Medicine continues to help, but she has a lot of trouble when she doesn't take it. When she takes it, it feels a lot better. Still binging, but still eating at night. Finds the vyvanse very helpful. Still not happy. She notes that she doesn't want to try any anti-depressants, does not want to see counselor. No other concerns.   EAG CLOGGED Duration: 2 weeks- now resolving, coming and going Involved ear(s):  "right Sensation of feeling clogged/plugged: yes Decreased/muffled hearing:yes Ear pain: no Fever: no Otorrhea: no Hearing loss: yes Upper respiratory infection symptoms: no Using Q-Tips: yes Status: better History of cerumenosis: yes Treatments attempted: debrox  Relevant past medical, surgical, family and social history reviewed and updated as indicated. Interim medical history since our last visit reviewed. Allergies and medications reviewed and updated.  Review of Systems  Constitutional: Negative.   Respiratory: Negative.   Cardiovascular: Negative.   Psychiatric/Behavioral: Negative.     Per HPI unless specifically indicated above     Objective:    BP (!) 146/80 (BP Location: Left Arm, Patient Position: Sitting, Cuff Size: Normal)   Pulse 70   Temp 98.2 F (36.8 C)   Wt 202 lb 14.4 oz (92 kg)   SpO2 99%   BMI 29.38 kg/m   Wt Readings from Last 3 Encounters:  02/14/17 202 lb 14.4 oz (92 kg)    11/24/16 202 lb (91.6 kg)  10/13/16 198 lb 3.2 oz (89.9 kg)    Physical Exam  Constitutional: She is oriented to person, place, and time. She appears well-developed and well-nourished. No distress.  HENT:  Head: Normocephalic and atraumatic.  Right Ear: Hearing normal.  Left Ear: Hearing and external ear normal.  Nose: Nose normal.  Mouth/Throat: Oropharynx is clear and moist. No oropharyngeal exudate.  Cerumen impaction on the R  Eyes: Conjunctivae, EOM and lids are normal. Pupils are equal, round, and reactive to light. Right eye exhibits no discharge. Left eye exhibits no discharge. No scleral icterus.  Neck: Normal range of motion. Neck supple. No JVD present. No tracheal deviation present. No thyromegaly present.  Cardiovascular: Normal rate, regular rhythm, normal heart sounds and intact distal pulses.  Exam reveals no gallop and no friction rub.   No murmur heard. Pulmonary/Chest: Effort normal and breath sounds normal. No stridor. No respiratory distress. She has no wheezes. She has no rales. She exhibits no tenderness.  Musculoskeletal: Normal range of motion.  Lymphadenopathy:    She has no cervical adenopathy.  Neurological: She is alert and oriented to person, place, and time.  Skin: Skin is warm, dry and intact. No rash noted. She is not diaphoretic. No erythema. No pallor.  Psychiatric: She has a normal mood and affect. Her speech is normal and behavior is normal. Judgment  and thought content normal. Cognition and memory are normal.  Nursing note and vitals reviewed.   Results for orders placed or performed in visit on 12/25/16  Cologuard  Result Value Ref Range   Cologuard Positive       Assessment & Plan:   Problem List Items Addressed This Visit      Other   Binge eating disorder - Primary    Stable on current regimen. Refills for 3 months given. Call with any concerns.       Relevant Medications   lisdexamfetamine (VYVANSE) 60 MG capsule   Positive  colorectal cancer screening using Cologuard test    Urgent referral put into GI. They will contact her ASAP.      Relevant Orders   Ambulatory referral to Gastroenterology    Other Visit Diagnoses    Hearing loss of right ear due to cerumen impaction       Ear flushed today with slight remnants of wax- will continue debrox and return as needed.        Follow up plan: Return in about 3 months (around 05/16/2017) for follow up binge eating.

## 2017-02-14 NOTE — Assessment & Plan Note (Signed)
Stable on current regimen. Refills for 3 months given. Call with any concerns.

## 2017-02-14 NOTE — Assessment & Plan Note (Signed)
Urgent referral put into GI. They will contact her ASAP.

## 2017-03-15 ENCOUNTER — Telehealth: Payer: Self-pay | Admitting: Gastroenterology

## 2017-03-15 NOTE — Telephone Encounter (Signed)
Patient call to schedule appt. Please call after 3:00

## 2017-03-20 ENCOUNTER — Other Ambulatory Visit: Payer: Self-pay

## 2017-03-20 DIAGNOSIS — Z1211 Encounter for screening for malignant neoplasm of colon: Secondary | ICD-10-CM

## 2017-03-20 NOTE — Telephone Encounter (Signed)
Gastroenterology Pre-Procedure Review  Request Date: 04/27/17 (Friday) Requesting Physician: Dr. Vicente Males  PATIENT REVIEW QUESTIONS: The patient responded to the following health history questions as indicated:    1. Are you having any GI issues? no 2. Do you have a personal history of Polyps? no 3. Do you have a family history of Colon Cancer or Polyps? no 4. Diabetes Mellitus? no 5. Joint replacements in the past 12 months?no 6. Major health problems in the past 3 months?no 7. Any artificial heart valves, MVP, or defibrillator?no    MEDICATIONS & ALLERGIES:    Patient reports the following regarding taking any anticoagulation/antiplatelet therapy:   Plavix, Coumadin, Eliquis, Xarelto, Lovenox, Pradaxa, Brilinta, or Effient? no Aspirin? no  Patient confirms/reports the following medications:  Current Outpatient Prescriptions  Medication Sig Dispense Refill  . alendronate (FOSAMAX) 70 MG tablet Take 1 tablet (70 mg total) by mouth once a week. Take with a full glass of water on an empty stomach. 12 tablet 3  . Calcium-Vitamin D-Vitamin K (CALCIUM + D + K PO) Take by mouth. 750mg  calcium, 500 vitamin d and 40mg     . ferrous sulfate 325 (65 FE) MG EC tablet Take 1 tablet (325 mg total) by mouth 3 (three) times daily with meals. (Patient taking differently: Take 325 mg by mouth 2 (two) times daily. ) 90 tablet 3  . lisdexamfetamine (VYVANSE) 60 MG capsule Take 1 capsule (60 mg total) by mouth daily. 30 capsule 0  . Multiple Vitamins-Minerals (CENTRUM SILVER PO) Take by mouth.    . naproxen sodium (ANAPROX) 220 MG tablet Take 440 mg by mouth daily.    [name] Kitchen OVER THE COUNTER MEDICATION More Free Ultra- Joint health    . ranitidine (ZANTAC) 150 MG tablet Take 150 mg by mouth 2 (two) times daily.     . traZODone (DESYREL) 50 MG tablet Take 0.5-1 tablets (25-50 mg total) by mouth at bedtime as needed for sleep. 30 tablet 1   No current facility-administered medications for this visit.     Patient  confirms/reports the following allergies:  No Known Allergies  No orders of the defined types were placed in this encounter.   AUTHORIZATION INFORMATION Primary Insurance: 1D#: Group #:  Secondary Insurance: 1D#: Group #:  SCHEDULE INFORMATION: Date: 04/27/17  Time: Location:[facility] Dr. Vicente Males

## 2017-03-28 ENCOUNTER — Telehealth: Payer: Self-pay | Admitting: Gastroenterology

## 2017-03-28 ENCOUNTER — Telehealth: Payer: Self-pay | Admitting: *Deleted

## 2017-03-28 NOTE — Telephone Encounter (Signed)
03/28/17 Faxed Prior Auth form to [employer].

## 2017-03-28 NOTE — Telephone Encounter (Signed)
03/28/17 Faxed Auth #: [id number] for Colonoscopy

## 2017-04-19 ENCOUNTER — Other Ambulatory Visit: Payer: Self-pay | Admitting: Urology

## 2017-04-27 ENCOUNTER — Encounter
Admission: RE | Disposition: A | Discharge status: Home / Self Care | Payer: Self-pay | Source: Ambulatory Visit | Attending: Gastroenterology

## 2017-04-27 ENCOUNTER — Ambulatory Visit: Payer: PPO | Admitting: Anesthesiology

## 2017-04-27 ENCOUNTER — Ambulatory Visit
Admission: RE | Admit: 2017-04-27 | Discharge: 2017-04-27 | Disposition: A | Discharge status: Home / Self Care | Payer: PPO | Source: Ambulatory Visit | Attending: Gastroenterology | Admitting: Gastroenterology

## 2017-04-27 ENCOUNTER — Encounter: Payer: Self-pay | Admitting: Anesthesiology

## 2017-04-27 DIAGNOSIS — Z825 Family history of asthma and other chronic lower respiratory diseases: Secondary | ICD-10-CM | POA: Diagnosis not present

## 2017-04-27 DIAGNOSIS — Z1211 Encounter for screening for malignant neoplasm of colon: Secondary | ICD-10-CM

## 2017-04-27 DIAGNOSIS — K648 Other hemorrhoids: Secondary | ICD-10-CM | POA: Insufficient documentation

## 2017-04-27 DIAGNOSIS — Z9884 Bariatric surgery status: Secondary | ICD-10-CM | POA: Insufficient documentation

## 2017-04-27 DIAGNOSIS — Z7983 Long term (current) use of bisphosphonates: Secondary | ICD-10-CM | POA: Diagnosis not present

## 2017-04-27 DIAGNOSIS — Z79899 Other long term (current) drug therapy: Secondary | ICD-10-CM | POA: Insufficient documentation

## 2017-04-27 DIAGNOSIS — Z791 Long term (current) use of non-steroidal anti-inflammatories (NSAID): Secondary | ICD-10-CM | POA: Insufficient documentation

## 2017-04-27 DIAGNOSIS — K640 First degree hemorrhoids: Secondary | ICD-10-CM | POA: Diagnosis not present

## 2017-04-27 DIAGNOSIS — D125 Benign neoplasm of sigmoid colon: Secondary | ICD-10-CM | POA: Diagnosis not present

## 2017-04-27 DIAGNOSIS — Z87891 Personal history of nicotine dependence: Secondary | ICD-10-CM | POA: Insufficient documentation

## 2017-04-27 DIAGNOSIS — Z8249 Family history of ischemic heart disease and other diseases of the circulatory system: Secondary | ICD-10-CM | POA: Diagnosis not present

## 2017-04-27 HISTORY — PX: COLONOSCOPY WITH PROPOFOL: SHX5780

## 2017-04-27 SURGERY — COLONOSCOPY WITH PROPOFOL
Anesthesia: General

## 2017-04-27 MED ORDER — LIDOCAINE 2% (20 MG/ML) 5 ML SYRINGE
INTRAMUSCULAR | Status: DC | PRN
  Administered 2017-04-27: 50 mg via INTRAVENOUS

## 2017-04-27 MED ORDER — LIDOCAINE HCL (PF) 1 % IJ SOLN
2.0000 mL | Freq: Once | INTRAMUSCULAR | Status: AC
  Administered 2017-04-27: 0.3 mL via INTRADERMAL

## 2017-04-27 MED ORDER — LIDOCAINE HCL (PF) 1 % IJ SOLN
INTRAMUSCULAR | Status: AC
  Administered 2017-04-27: 0.3 mL via INTRADERMAL
  Filled 2017-04-27: qty 2

## 2017-04-27 MED ORDER — PROPOFOL 500 MG/50ML IV EMUL
INTRAVENOUS | Status: DC | PRN
  Administered 2017-04-27: 200 ug/kg/min via INTRAVENOUS

## 2017-04-27 MED ORDER — SODIUM CHLORIDE 0.9 % IV SOLN
INTRAVENOUS | Status: DC
  Administered 2017-04-27: 1000 mL via INTRAVENOUS

## 2017-04-27 MED ORDER — PROPOFOL 10 MG/ML IV BOLUS
INTRAVENOUS | Status: DC | PRN
  Administered 2017-04-27: 20 mg via INTRAVENOUS
  Administered 2017-04-27: 80 mg via INTRAVENOUS

## 2017-04-27 MED ORDER — PROPOFOL 500 MG/50ML IV EMUL
INTRAVENOUS | Status: AC
  Filled 2017-04-27: qty 50

## 2017-04-27 NOTE — Transfer of Care (Signed)
Immediate Anesthesia Transfer of Care Note  Patient: Mckenzie Webster  Procedure(s) Performed: Procedure(s): COLONOSCOPY WITH PROPOFOL (N/A)  Patient Location: Endoscopy Unit  Anesthesia Type:General  Level of Consciousness: awake  Airway & Oxygen Therapy: Patient connected to nasal cannula oxygen  Post-op Assessment: Post -op Vital signs reviewed and stable  Post vital signs: stable  Last Vitals:  Vitals:   04/27/17 0840 04/27/17 0842  BP: 136/88 136/88  Pulse: 63 64  Resp: 15 17  Temp: (!) 35.7 C (!) 35.7 C    Last Pain:  Vitals:   04/27/17 0842  TempSrc: Tympanic         Complications: No apparent anesthesia complications

## 2017-04-27 NOTE — Op Note (Signed)
[facility]astroenter[facility] Patient Name: Mckenzie Webster Procedure Date: 04/27/2017 7:56 AM MRN: [id number] Account #: [id number] Date of Birth: 16-Jun-1951 Admit Type: Outpatient Age: 66 Room: [facility] ENDO ROOM 4 Gender: Female Note Status: Finalized Procedure:            Colonoscopy Indications:          Screening for colorectal malignant neoplasm Providers:            Jonathon Bellows MD, MD Referring MD:         Valerie Roys (Referring MD) Medicines:            Monitored Anesthesia Care Complications:        No immediate complications. Procedure:            Pre-Anesthesia Assessment:                       - Prior to the procedure, a History and Physical was                        performed, and patient medications, allergies and                        sensitivities were reviewed. The patient's tolerance of                        previous anesthesia was reviewed.                       - The risks and benefits of the procedure and the                        sedation options and risks were discussed with the                        patient. All questions were answered and informed                        consent was obtained.                       - ASA Grade Assessment: II - A patient with mild                        systemic disease.                       After obtaining informed consent, the colonoscope was                        passed under direct vision. Throughout the procedure,                        the patient's blood pressure, pulse, and oxygen                        saturations were monitored continuously. The Olympus                        CF-H180AL colonoscope ( S#: [id number] ) was introduced  through the anus and advanced to the the cecum,                        identified by the appendiceal orifice, IC valve and                        transillumination. The colonoscopy was performed with                        ease. The patient  tolerated the procedure well. The                        quality of the bowel preparation was good. Findings:      The perianal and digital rectal examinations were normal.      Non-bleeding internal hemorrhoids were found during retroflexion. The       hemorrhoids were small and Grade I (internal hemorrhoids that do not       prolapse).      A 15 mm polyp was found in the sigmoid colon. The polyp was       pedunculated. The polyp was removed with a hot snare. Resection and       retrieval were complete. To prevent bleeding after the polypectomy, one       hemostatic clip was successfully placed. There was no bleeding during,       or at the end, of the procedure.      The exam was otherwise without abnormality on direct and retroflexion       views. Impression:           - Non-bleeding internal hemorrhoids.                       - One 15 mm polyp in the sigmoid colon, removed with a                        hot snare. Resected and retrieved. Clip was placed.                       - The examination was otherwise normal on direct and                        retroflexion views. Recommendation:       - Discharge patient to home (with escort).                       - Resume previous diet.                       - Continue present medications.                       - Await pathology results.                       - No ibuprofen, naproxen, or other non-steroidal                        anti-inflammatory drugs for 6 weeks after polyp removal.                       - Repeat colonoscopy for surveillance based  on                        pathology results. Procedure Code(s):    --- Professional ---                       [phone], Colonoscopy, flexible; with removal of tumor(s),                        polyp(s), or other lesion(s) by snare technique Diagnosis Code(s):    --- Professional ---                       Z12.11, Encounter for screening for malignant neoplasm                        of colon                        D12.5, Benign neoplasm of sigmoid colon                       K64.0, First degree hemorrhoids CPT copyright 2016 American Medical Association. All rights reserved. The codes documented in this report are preliminary and upon coder review may  be revised to meet current compliance requirements. Jonathon Bellows, MD Jonathon Bellows MD, MD 04/27/2017 8:39:15 AM This report has been signed electronically. Number of Addenda: 0 Note Initiated On: 04/27/2017 7:56 AM Scope Withdrawal Time: 0 hours 22 minutes 51 seconds  Total Procedure Duration: 0 hours 27 minutes 26 seconds       [facility]

## 2017-04-27 NOTE — Anesthesia Preprocedure Evaluation (Signed)
Anesthesia Evaluation  Patient identified by MRN, date of birth, ID band Patient awake    Reviewed: Allergy & Precautions, NPO status , Patient's Chart, lab work & pertinent test results  Airway Mallampati: II  TM Distance: >3 FB     Dental  (+) Caps   Pulmonary former smoker,    Pulmonary exam normal        Cardiovascular negative cardio ROS Normal cardiovascular exam     Neuro/Psych negative neurological ROS     GI/Hepatic Neg liver ROS, PUD,   Endo/Other  negative endocrine ROS  Renal/GU negative Renal ROS  negative genitourinary   Musculoskeletal negative musculoskeletal ROS (+)   Abdominal Normal abdominal exam  (+)   Peds negative pediatric ROS (+)  Hematology  (+) anemia ,   Anesthesia Other Findings   Reproductive/Obstetrics                             Anesthesia Physical Anesthesia Plan  ASA: II  Anesthesia Plan: General   Post-op Pain Management:    Induction: Intravenous  PONV Risk Score and Plan:   Airway Management Planned: Nasal Cannula  Additional Equipment:   Intra-op Plan:   Post-operative Plan:   Informed Consent: I have reviewed the patients History and Physical, chart, labs and discussed the procedure including the risks, benefits and alternatives for the proposed anesthesia with the patient or authorized representative who has indicated his/her understanding and acceptance.   Dental advisory given  Plan Discussed with: CRNA and Surgeon  Anesthesia Plan Comments:         Anesthesia Quick Evaluation

## 2017-04-27 NOTE — Anesthesia Post-op Follow-up Note (Cosign Needed)
Anesthesia QCDR form completed.        

## 2017-04-27 NOTE — H&P (Signed)
Jonathon Bellows MD [address] [place], [place] [address] Phone: [phone] Fax : [phone]  Primary Care Physician:  Valerie Roys, DO Primary Gastroenterologist:  Dr. Jonathon Bellows   Pre-Procedure History & Physical: HPI:  Mckenzie Webster is a 66 y.o. female is here for an colonoscopy.   Past Medical History:  Diagnosis Date  . Anemia   . Cataract   . Dry eye syndrome   . Stomach ulcer     Past Surgical History:  Procedure Laterality Date  . EYE SURGERY     Cataract  . FOOT SURGERY     Change the alignment of the toes to stop the formation of corns  . gastric bypass  1981    Prior to Admission medications   Medication Sig Start Date End Date Taking? Authorizing Provider  alendronate (FOSAMAX) 70 MG tablet Take 1 tablet (70 mg total) by mouth once a week. Take with a full glass of water on an empty stomach. 01/19/17   Kathrine Haddock, NP  Calcium-Vitamin D-Vitamin K (CALCIUM + D + K PO) Take by mouth. 750mg  calcium, 500 vitamin d and 40mg     [provider]  ferrous sulfate 325 (65 FE) MG EC tablet Take 1 tablet (325 mg total) by mouth 3 (three) times daily with meals. Patient taking differently: Take 325 mg by mouth 2 (two) times daily.  01/05/16   Johnson, Megan P, DO  lisdexamfetamine (VYVANSE) 60 MG capsule Take 1 capsule (60 mg total) by mouth daily. 02/14/17   Johnson, Megan P, DO  Multiple Vitamins-Minerals (CENTRUM SILVER PO) Take by mouth.    [provider]  naproxen sodium (ANAPROX) 220 MG tablet Take 440 mg by mouth daily.    [provider]  OVER THE COUNTER MEDICATION More Free Ultra- Joint health    [provider]  ranitidine (ZANTAC) 150 MG tablet Take 150 mg by mouth 2 (two) times daily.     [provider]  traZODone (DESYREL) 50 MG tablet Take 0.5-1 tablets (25-50 mg total) by mouth at bedtime as needed for sleep. 10/13/16   Park Liter P, DO    Allergies as of 03/20/2017  . (No Known Allergies)     Family History  Problem Relation Age of Onset  . Heart disease Mother   . COPD Mother   . Heart disease Father   . Hypertension Father   . Heart disease Maternal Grandfather   . Alzheimer's disease Paternal Grandmother   . Breast cancer Neg Hx     Social History   Social History  . Marital status: Divorced    Spouse name: N/A  . Number of children: N/A  . Years of education: N/A   Occupational History  . Not on file.   Social History Main Topics  . Smoking status: Former Smoker    Quit date: 10/31/1995  . Smokeless tobacco: Former Systems developer  . Alcohol use Yes     Comment: on occasion  . Drug use: No  . Sexual activity: Not Currently   Other Topics Concern  . Not on file   Social History Narrative  . No narrative on file    Review of Systems: See HPI, otherwise negative ROS  Physical Exam: BP (!) 150/81   Pulse 70   Temp 97 F (36.1 C) (Tympanic)   Resp 18   Ht 5\' 9"  (1.753 m)   Wt 200 lb (90.7 kg)   SpO2 100%   BMI 29.53 kg/m  General:  Alert,  pleasant and cooperative in NAD Head:  Normocephalic and atraumatic. Neck:  Supple; no masses or thyromegaly. Lungs:  Clear throughout to auscultation.    Heart:  Regular rate and rhythm. Abdomen:  Soft, nontender and nondistended. Normal bowel sounds, without guarding, and without rebound.   Neurologic:  Alert and  oriented x4;  grossly normal neurologically.  Impression/Plan: Mckenzie Webster is here for an colonoscopy to be performed for Screening colonoscopy average risk    Risks, benefits, limitations, and alternatives regarding  colonoscopy have been reviewed with the patient.  Questions have been answered.  All parties agreeable.   Jonathon Bellows, MD  04/27/2017, 7:58 AM

## 2017-04-27 NOTE — Anesthesia Postprocedure Evaluation (Signed)
Anesthesia Post Note  Patient: Mckenzie Webster  Procedure(s) Performed: Procedure(s) (LRB): COLONOSCOPY WITH PROPOFOL (N/A)  Patient location during evaluation: Endoscopy Anesthesia Type: General Level of consciousness: awake and alert Pain management: pain level controlled Vital Signs Assessment: post-procedure vital signs reviewed and stable Respiratory status: spontaneous breathing, nonlabored ventilation, respiratory function stable and patient connected to nasal cannula oxygen Cardiovascular status: blood pressure returned to baseline and stable Postop Assessment: no signs of nausea or vomiting Anesthetic complications: no     Last Vitals:  Vitals:   04/27/17 0907 04/27/17 0910  BP: (!) 155/85 (!) 162/93  Pulse: 64 65  Resp: 15 13  Temp:      Last Pain:  Vitals:   04/27/17 0842  TempSrc: Tympanic                 Genevieve Arbaugh S

## 2017-04-30 ENCOUNTER — Encounter: Payer: Self-pay | Admitting: Gastroenterology

## 2017-05-08 LAB — SURGICAL PATHOLOGY

## 2017-05-08 NOTE — Progress Notes (Signed)
Contacted pathology. Waiting for response.

## 2017-05-13 ENCOUNTER — Encounter: Payer: Self-pay | Admitting: Gastroenterology

## 2017-05-14 ENCOUNTER — Encounter: Payer: Self-pay | Admitting: Family Medicine

## 2017-05-14 ENCOUNTER — Ambulatory Visit (INDEPENDENT_AMBULATORY_CARE_PROVIDER_SITE_OTHER): Payer: PPO | Admitting: Family Medicine

## 2017-05-14 ENCOUNTER — Other Ambulatory Visit: Payer: Self-pay

## 2017-05-14 VITALS — BP 135/82 | HR 75 | Temp 98.0°F | Wt 204.0 lb

## 2017-05-14 DIAGNOSIS — H66002 Acute suppurative otitis media without spontaneous rupture of ear drum, left ear: Secondary | ICD-10-CM | POA: Diagnosis not present

## 2017-05-14 MED ORDER — CETIRIZINE HCL 10 MG PO TABS: 10.0000 mg | ORAL_TABLET | Freq: Every day | ORAL | 11 refills | Status: DC

## 2017-05-14 MED ORDER — BENZONATATE 100 MG PO CAPS: 200.0000 mg | ORAL_CAPSULE | Freq: Three times a day (TID) | ORAL | 0 refills | Status: DC | PRN

## 2017-05-14 MED ORDER — HYDROCOD POLST-CPM POLST ER 10-8 MG/5ML PO SUER
5.0000 mL | Freq: Two times a day (BID) | ORAL | 0 refills | Status: DC | PRN
Start: 2017-05-14 — End: 2017-06-08

## 2017-05-14 MED ORDER — AMOXICILLIN-POT CLAVULANATE 875-125 MG PO TABS: 1.0000 | ORAL_TABLET | Freq: Two times a day (BID) | ORAL | 0 refills | Status: DC

## 2017-05-14 MED ORDER — PREDNISONE 20 MG PO TABS: 40.0000 mg | ORAL_TABLET | Freq: Every day | ORAL | 0 refills | Status: DC

## 2017-05-14 MED ORDER — FLUTICASONE PROPIONATE 50 MCG/ACT NA SUSP: 2.0000 | Freq: Every day | NASAL | 6 refills | Status: DC

## 2017-05-14 NOTE — Telephone Encounter (Signed)
Responded to patient.

## 2017-05-14 NOTE — Progress Notes (Signed)
BP 135/82   Pulse 75   Temp 98 F (36.7 C)   Wt 204 lb (92.5 kg)   BMI 30.13 kg/m    Subjective:    Patient ID: Mckenzie Webster, female    DOB: 05-05-1951, 66 y.o.   MRN: [id number]  HPI: Mckenzie Webster is a 66 y.o. female  Chief Complaint  Patient presents with  . Ear Pain    x 2 days, left ear  . Cough    x 5 weeks, last week cough was productive.  Some nasal congestion, head congestion, some sore throat.   Patient presents with persistent URI sxs. Started about 5 weeks ago with a hacking cough and malaise. Started becoming congested and cough became productive with wheezing and chest tightness about 2 weeks ago. Left ear pain started 2 days ago. Hearing muffled, sinus pressure. Denies fever, chills, N/V/D, SOB. Not currently taking anything for sxs. No known hx of allergies. Daughter with similar sxs.   Relevant past medical, surgical, family and social history reviewed and updated as indicated. Interim medical history since our last visit reviewed. Allergies and medications reviewed and updated.  Review of Systems  Constitutional: Positive for fatigue.  HENT: Positive for congestion, ear pain, hearing loss (muffled), rhinorrhea, sinus pain and sinus pressure.   Eyes: Negative.   Respiratory: Positive for cough, chest tightness and wheezing.   Cardiovascular: Negative.   Gastrointestinal: Negative.   Genitourinary: Negative.   Musculoskeletal: Negative.   Neurological: Negative.   Psychiatric/Behavioral: Negative.     Per HPI unless specifically indicated above     Objective:    BP 135/82   Pulse 75   Temp 98 F (36.7 C)   Wt 204 lb (92.5 kg)   BMI 30.13 kg/m   Wt Readings from Last 3 Encounters:  05/14/17 204 lb (92.5 kg)  04/27/17 200 lb (90.7 kg)  02/14/17 202 lb 14.4 oz (92 kg)    Physical Exam  Constitutional: She is oriented to person, place, and time. She appears well-developed and well-nourished. No distress.  HENT:  Head: Atraumatic.  Right TM  benign, left TM bulging and erythematous Nasal mucosa boggy and erythematous Oropharynx erythematous posteriorly  Eyes: Pupils are equal, round, and reactive to light. Conjunctivae are normal. No scleral icterus.  Neck: Normal range of motion. Neck supple.  Cardiovascular: Normal rate and normal heart sounds.   Pulmonary/Chest: Effort normal. No respiratory distress. She has wheezes (mild diffuse).  Musculoskeletal: Normal range of motion.  Lymphadenopathy:    She has no cervical adenopathy.  Neurological: She is alert and oriented to person, place, and time.  Skin: Skin is warm and dry.  Psychiatric: She has a normal mood and affect. Her behavior is normal.  Nursing note and vitals reviewed.     Assessment & Plan:   Problem List Items Addressed This Visit    None    Visit Diagnoses    Acute suppurative otitis media of left ear without spontaneous rupture of tympanic membrane, recurrence not specified    -  Primary   Augmentin and prednisone sent, suspect allergy etiology given timeline so trial of zyrtec and flonase additionally. Tessalon and tussionex sent for cough.    Relevant Medications   amoxicillin-clavulanate (AUGMENTIN) 875-125 MG tablet    Precautions, side effects, and expectations discussed with all medications given today. Will start nexium during prednisone course and discussed continued d/c of NSAIDs until complete.    Follow up plan: Return for as scheduled .

## 2017-05-16 ENCOUNTER — Ambulatory Visit
Admission: RE | Admit: 2017-05-16 | Discharge: 2017-05-16 | Disposition: A | Discharge status: Home / Self Care | Payer: PPO | Source: Ambulatory Visit | Attending: Family Medicine | Admitting: Family Medicine

## 2017-05-16 ENCOUNTER — Encounter: Payer: Self-pay | Admitting: Family Medicine

## 2017-05-16 ENCOUNTER — Ambulatory Visit (INDEPENDENT_AMBULATORY_CARE_PROVIDER_SITE_OTHER): Payer: PPO | Admitting: Family Medicine

## 2017-05-16 VITALS — BP 125/81 | HR 80 | Temp 98.7°F | Wt 204.4 lb

## 2017-05-16 DIAGNOSIS — R062 Wheezing: Secondary | ICD-10-CM | POA: Diagnosis not present

## 2017-05-16 DIAGNOSIS — J181 Lobar pneumonia, unspecified organism: Secondary | ICD-10-CM

## 2017-05-16 DIAGNOSIS — J189 Pneumonia, unspecified organism: Secondary | ICD-10-CM

## 2017-05-16 DIAGNOSIS — R52 Pain, unspecified: Secondary | ICD-10-CM | POA: Diagnosis not present

## 2017-05-16 DIAGNOSIS — F5081 Binge eating disorder: Secondary | ICD-10-CM | POA: Diagnosis not present

## 2017-05-16 DIAGNOSIS — R5383 Other fatigue: Secondary | ICD-10-CM | POA: Diagnosis not present

## 2017-05-16 DIAGNOSIS — R05 Cough: Secondary | ICD-10-CM | POA: Diagnosis not present

## 2017-05-16 DIAGNOSIS — R0602 Shortness of breath: Secondary | ICD-10-CM | POA: Diagnosis not present

## 2017-05-16 DIAGNOSIS — F50819 Binge eating disorder, unspecified: Secondary | ICD-10-CM

## 2017-05-16 MED ORDER — PREDNISONE 10 MG PO TABS: ORAL_TABLET | ORAL | 0 refills | Status: DC

## 2017-05-16 MED ORDER — ALBUTEROL SULFATE (2.5 MG/3ML) 0.083% IN NEBU: 2.5000 mg | INHALATION_SOLUTION | Freq: Once | RESPIRATORY_TRACT | Status: DC

## 2017-05-16 MED ORDER — LISDEXAMFETAMINE DIMESYLATE 60 MG PO CAPS: 60.0000 mg | ORAL_CAPSULE | Freq: Every day | ORAL | 0 refills | Status: DC

## 2017-05-16 MED ORDER — ALBUTEROL SULFATE HFA 108 (90 BASE) MCG/ACT IN AERS
2.0000 | INHALATION_SPRAY | Freq: Four times a day (QID) | RESPIRATORY_TRACT | 0 refills | Status: DC | PRN
Start: 2017-05-16 — End: 2017-11-28

## 2017-05-16 MED ORDER — DOXYCYCLINE HYCLATE 100 MG PO TABS: 100.0000 mg | ORAL_TABLET | Freq: Two times a day (BID) | ORAL | 0 refills | Status: DC

## 2017-05-16 NOTE — Progress Notes (Signed)
BP 125/81 (BP Location: Left Arm, Patient Position: Sitting, Cuff Size: Normal)   Pulse 80   Temp 98.7 F (37.1 C)   Wt 204 lb 6 oz (92.7 kg)   SpO2 96%   BMI 30.18 kg/m    Subjective:    Patient ID: Mckenzie Webster, female    DOB: 12/18/1950, 66 y.o.   MRN: [id number]  HPI: LALAH DURANGO is a 66 y.o. female  Chief Complaint  Patient presents with  . Binge Eating Disorder   Stopped her Aleve prior to her colonoscopy. She notes that she has had a cough since about a week and a half before her colonoscopy. She notes that it was more of an annoying tickle in her throat, about 2 weeks ago it went into her coughing stuff up. She has been feeling really nasty. She notes that she is dragging. She notes that she has been feeling very tired and having body aches. She notes that she was feeling that way before she stopped the aleve, but it got worse after that. She feels more like she did when she was very anemic. + headaches- more like a sinus headaches. No fevers. No gut issues. No breathing issues except the cough. Some trouble breathing with exertion, no shortness of breath. Has been working in her yard a lot this year. Doesn't remember getting bit by any ticks. She also notes that she has been having some brain fog. She is not feeling like herself and hasn't felt like herself.   She notes that the vyvanse hasn't seemed like it helped. She notes that usually it makes her feel like she can do things- but it hasn't been having that effect. However, it has been working.   Relevant past medical, surgical, family and social history reviewed and updated as indicated. Interim medical history since our last visit reviewed. Allergies and medications reviewed and updated.  Review of Systems  Constitutional: Positive for activity change and fatigue. Negative for appetite change, chills, diaphoresis, fever and unexpected weight change.  HENT: Positive for congestion, ear pain, hearing loss, postnasal  drip, rhinorrhea and sinus pressure. Negative for dental problem, drooling, ear discharge, facial swelling, mouth sores, nosebleeds, sinus pain, sneezing, sore throat, tinnitus, trouble swallowing and voice change.   Eyes: Negative.   Respiratory: Positive for cough, chest tightness, shortness of breath and wheezing. Negative for apnea, choking and stridor.   Cardiovascular: Negative.   Musculoskeletal: Positive for arthralgias and myalgias. Negative for back pain, gait problem, joint swelling, neck pain and neck stiffness.  Neurological: Positive for headaches. Negative for dizziness, tremors, seizures, syncope, facial asymmetry, speech difficulty, weakness, light-headedness and numbness.  Psychiatric/Behavioral: Negative.     Per HPI unless specifically indicated above     Objective:    BP 125/81 (BP Location: Left Arm, Patient Position: Sitting, Cuff Size: Normal)   Pulse 80   Temp 98.7 F (37.1 C)   Wt 204 lb 6 oz (92.7 kg)   SpO2 96%   BMI 30.18 kg/m   Wt Readings from Last 3 Encounters:  05/16/17 204 lb 6 oz (92.7 kg)  05/14/17 204 lb (92.5 kg)  04/27/17 200 lb (90.7 kg)    Physical Exam  Constitutional: She is oriented to person, place, and time. She appears well-developed and well-nourished. No distress.  HENT:  Head: Normocephalic and atraumatic.  Right Ear: Hearing normal.  Left Ear: Hearing normal.  Nose: Nose normal.  Eyes: Conjunctivae and lids are normal. Right eye exhibits no discharge.  Left eye exhibits no discharge. No scleral icterus.  Cardiovascular: Normal rate, regular rhythm, normal heart sounds and intact distal pulses.  Exam reveals no gallop and no friction rub.   No murmur heard. Pulmonary/Chest: Effort normal. No respiratory distress. She has wheezes. She has no rales. She exhibits no tenderness.  Rhonchi in LLL following neb  Musculoskeletal: Normal range of motion.  Neurological: She is alert and oriented to person, place, and time.  Skin: Skin is  warm, dry and intact. No rash noted. She is not diaphoretic. No erythema. No pallor.  Psychiatric: She has a normal mood and affect. Her speech is normal and behavior is normal. Judgment and thought content normal. Cognition and memory are normal.  Nursing note and vitals reviewed.      Assessment & Plan:   Problem List Items Addressed This Visit      Other   Binge eating disorder    Stable on current regimen. Refills for 3 months given. Call with any concerns.       Relevant Medications   lisdexamfetamine (VYVANSE) 60 MG capsule    Other Visit Diagnoses    Fatigue, unspecified type    -  Primary   Concern for tick bourne illnesses. Will check labs and change antibiotics to doxy to cover that. Rest. Await results. Call with any concerns. Recheck 2 weeks.    Relevant Orders   CBC with Differential/Platelet   Comprehensive metabolic panel   Thyroid Panel With TSH   Lyme Ab/Western Blot Reflex   Rocky mtn spotted fvr abs pnl(IgG+IgM)   Ehrlichia Antibody Panel   Babesia microti Antibody Panel   Body aches       Concern for tick bourne illnesses. Will check labs and change antibiotics to doxy to cover that. Rest. Await results. Call with any concerns. Recheck 2 weeks.    Relevant Orders   Comprehensive metabolic panel   VITAMIN D 25 Hydroxy (Vit-D Deficiency, Fractures)   Lyme Ab/Western Blot Reflex   Rocky mtn spotted fvr abs pnl(IgG+IgM)   Ehrlichia Antibody Panel   Babesia microti Antibody Panel   Wheezing       Slightly better following neb, concern for pneumonia. Will add albuterol. Increasing prednisone. Abx changed to doxy. Recheck 2 weeks.    Relevant Medications   albuterol (PROVENTIL) (2.5 MG/3ML) 0.083% nebulizer solution 2.5 mg   Other Relevant Orders   DG Chest 2 View   Pneumonia of left lower lobe due to infectious organism ([place])       Obtaining CXR. Change to doxy. Add albuterol. Longer prednisone taper.   Relevant Medications   doxycycline (VIBRA-TABS) 100  MG tablet   albuterol (PROVENTIL) (2.5 MG/3ML) 0.083% nebulizer solution 2.5 mg   albuterol (PROVENTIL HFA;VENTOLIN HFA) 108 (90 Base) MCG/ACT inhaler   Other Relevant Orders   DG Chest 2 View       Follow up plan: Return in about 1 week (around 05/23/2017) for Follow up lungs.

## 2017-05-16 NOTE — Assessment & Plan Note (Signed)
Stable on current regimen. Refills for 3 months given. Call with any concerns.

## 2017-05-20 LAB — CBC WITH DIFFERENTIAL/PLATELET
BASOS ABS: 0 10*3/uL (ref 0.0–0.2)
BASOS: 0 %
EOS (ABSOLUTE): 0 10*3/uL (ref 0.0–0.4)
Eos: 0 %
HEMATOCRIT: 43.6 % (ref 34.0–46.6)
HEMOGLOBIN: 14.4 g/dL (ref 11.1–15.9)
IMMATURE GRANS (ABS): 0 10*3/uL (ref 0.0–0.1)
Immature Granulocytes: 0 %
LYMPHS ABS: 1.1 10*3/uL (ref 0.7–3.1)
LYMPHS: 10 %
MCH: 29.7 pg (ref 26.6–33.0)
MCHC: 33 g/dL (ref 31.5–35.7)
MCV: 90 fL (ref 79–97)
MONOCYTES: 5 %
Monocytes Absolute: 0.5 10*3/uL (ref 0.1–0.9)
NEUTROS ABS: 9.6 10*3/uL — AB (ref 1.4–7.0)
Neutrophils: 85 %
Platelets: 287 10*3/uL (ref 150–379)
RBC: 4.85 x10E6/uL (ref 3.77–5.28)
RDW: 14 % (ref 12.3–15.4)
WBC: 11.3 10*3/uL — ABNORMAL HIGH (ref 3.4–10.8)

## 2017-05-20 LAB — EHRLICHIA ANTIBODY PANEL
E. CHAFFEENSIS (HME) IGM TITER: NEGATIVE
E.Chaffeensis (HME) IgG: NEGATIVE
HGE IGG TITER: NEGATIVE
HGE IgM Titer: NEGATIVE

## 2017-05-20 LAB — COMPREHENSIVE METABOLIC PANEL
A/G RATIO: 1.8 (ref 1.2–2.2)
ALBUMIN: 4.3 g/dL (ref 3.6–4.8)
ALK PHOS: 106 IU/L (ref 39–117)
ALT: 43 IU/L — ABNORMAL HIGH (ref 0–32)
AST: 31 IU/L (ref 0–40)
BILIRUBIN TOTAL: 0.6 mg/dL (ref 0.0–1.2)
BUN / CREAT RATIO: 26 (ref 12–28)
BUN: 21 mg/dL (ref 8–27)
CHLORIDE: 103 mmol/L (ref 96–106)
CO2: 22 mmol/L (ref 20–29)
Calcium: 9.3 mg/dL (ref 8.7–10.3)
Creatinine, Ser: 0.8 mg/dL (ref 0.57–1.00)
GFR calc non Af Amer: 78 mL/min/{1.73_m2} (ref >59)
GFR, EST AFRICAN AMERICAN: 89 mL/min/{1.73_m2} (ref >59)
GLOBULIN, TOTAL: 2.4 g/dL (ref 1.5–4.5)
GLUCOSE: 97 mg/dL (ref 65–99)
POTASSIUM: 4.5 mmol/L (ref 3.5–5.2)
SODIUM: 140 mmol/L (ref 134–144)
TOTAL PROTEIN: 6.7 g/dL (ref 6.0–8.5)

## 2017-05-20 LAB — BABESIA MICROTI ANTIBODY PANEL: Babesia microti IgM: <1:10 {titer}

## 2017-05-20 LAB — THYROID PANEL WITH TSH
FREE THYROXINE INDEX: 2.3 (ref 1.2–4.9)
T3 Uptake Ratio: 26 % (ref 24–39)
T4, Total: 8.8 ug/dL (ref 4.5–12.0)
TSH: 2.13 u[IU]/mL (ref 0.450–4.500)

## 2017-05-20 LAB — ROCKY MTN SPOTTED FVR ABS PNL(IGG+IGM)
RMSF IGG: NEGATIVE
RMSF IGM: 0.32 {index} (ref 0.00–0.89)

## 2017-05-20 LAB — VITAMIN D 25 HYDROXY (VIT D DEFICIENCY, FRACTURES): Vit D, 25-Hydroxy: 26.2 ng/mL — ABNORMAL LOW (ref 30.0–100.0)

## 2017-05-20 LAB — LYME AB/WESTERN BLOT REFLEX: Lyme IgG/IgM Ab: <0.91 {ISR} (ref 0.00–0.90)

## 2017-05-23 ENCOUNTER — Ambulatory Visit (INDEPENDENT_AMBULATORY_CARE_PROVIDER_SITE_OTHER): Payer: PPO | Admitting: Family Medicine

## 2017-05-23 ENCOUNTER — Encounter: Payer: Self-pay | Admitting: Family Medicine

## 2017-05-23 VITALS — BP 135/88 | HR 73 | Temp 98.5°F | Wt 210.6 lb

## 2017-05-23 DIAGNOSIS — H938X2 Other specified disorders of left ear: Secondary | ICD-10-CM

## 2017-05-23 DIAGNOSIS — J209 Acute bronchitis, unspecified: Secondary | ICD-10-CM | POA: Diagnosis not present

## 2017-05-23 NOTE — Progress Notes (Signed)
BP 135/88 (BP Location: Left Arm, Patient Position: Sitting, Cuff Size: Normal)   Pulse 73   Temp 98.5 F (36.9 C)   Wt 210 lb 9 oz (95.5 kg)   SpO2 97%   BMI 31.09 kg/m    Subjective:    Patient ID: Mckenzie Webster, female    DOB: 1951/02/19, 66 y.o.   MRN: [id number]  HPI: Mckenzie Webster is a 66 y.o. female  Chief Complaint  Patient presents with  . lung recheck  . Other    Patient states that her ear is still clogged up, headached, and really fatigued in the afternoon. She states that over the last couple of days she has been ok in the morning,    Debraann notes that she is starting to feel better. She is becoming more interested in things, but is still really tired and not feeling like herself. She also notes that her ears feel really clogged. No fevers. No chills. Occasional cough and SOB. Otherwise doing well with no other concerns or complaints at this time.   Relevant past medical, surgical, family and social history reviewed and updated as indicated. Interim medical history since our last visit reviewed. Allergies and medications reviewed and updated.  Review of Systems  Constitutional: Negative.   HENT: Positive for ear pain. Negative for congestion, dental problem, drooling, ear discharge, facial swelling, hearing loss, mouth sores, nosebleeds, postnasal drip, rhinorrhea, sinus pain, sinus pressure, sneezing, sore throat, tinnitus, trouble swallowing and voice change.   Respiratory: Positive for cough, chest tightness, shortness of breath and wheezing. Negative for apnea, choking and stridor.   Cardiovascular: Negative.   Psychiatric/Behavioral: Negative.     Per HPI unless specifically indicated above     Objective:    BP 135/88 (BP Location: Left Arm, Patient Position: Sitting, Cuff Size: Normal)   Pulse 73   Temp 98.5 F (36.9 C)   Wt 210 lb 9 oz (95.5 kg)   SpO2 97%   BMI 31.09 kg/m   Wt Readings from Last 3 Encounters:  05/23/17 210 lb 9 oz (95.5 kg)    05/16/17 204 lb 6 oz (92.7 kg)  05/14/17 204 lb (92.5 kg)    Physical Exam  Constitutional: She is oriented to person, place, and time. She appears well-developed and well-nourished. No distress.  HENT:  Head: Normocephalic and atraumatic.  Right Ear: Hearing and external ear normal.  Left Ear: Hearing and external ear normal.  Nose: Nose normal.  Mouth/Throat: Oropharynx is clear and moist. No oropharyngeal exudate.  Very dry EACs bilaterally  Eyes: Pupils are equal, round, and reactive to light. Conjunctivae, EOM and lids are normal. Right eye exhibits no discharge. Left eye exhibits no discharge. No scleral icterus.  Neck: Normal range of motion. Neck supple. No JVD present. No tracheal deviation present. No thyromegaly present.  Cardiovascular: Normal rate, regular rhythm, normal heart sounds and intact distal pulses.  Exam reveals no gallop and no friction rub.   No murmur heard. Pulmonary/Chest: Effort normal. No stridor. No respiratory distress. She has wheezes. She has no rales. She exhibits no tenderness.  Musculoskeletal: Normal range of motion.  Lymphadenopathy:    She has no cervical adenopathy.  Neurological: She is alert and oriented to person, place, and time.  Skin: Skin is warm, dry and intact. No rash noted. She is not diaphoretic. No erythema. No pallor.  Psychiatric: She has a normal mood and affect. Her speech is normal and behavior is normal. Judgment and thought content  normal. Cognition and memory are normal.  Nursing note and vitals reviewed.   Results for orders placed or performed in visit on 05/16/17  CBC with Differential/Platelet  Result Value Ref Range   WBC 11.3 (H) 3.4 - 10.8 x10E3/uL   RBC 4.85 3.77 - 5.28 x10E6/uL   Hemoglobin 14.4 11.1 - 15.9 g/dL   Hematocrit 43.6 34.0 - 46.6 %   MCV 90 79 - 97 fL   MCH 29.7 26.6 - 33.0 pg   MCHC 33.0 31.5 - 35.7 g/dL   RDW 14.0 12.3 - 15.4 %   Platelets 287 150 - 379 x10E3/uL   Neutrophils 85 Not Estab. %    Lymphs 10 Not Estab. %   Monocytes 5 Not Estab. %   Eos 0 Not Estab. %   Basos 0 Not Estab. %   Neutrophils Absolute 9.6 (H) 1.4 - 7.0 x10E3/uL   Lymphocytes Absolute 1.1 0.7 - 3.1 x10E3/uL   Monocytes Absolute 0.5 0.1 - 0.9 x10E3/uL   EOS (ABSOLUTE) 0.0 0.0 - 0.4 x10E3/uL   Basophils Absolute 0.0 0.0 - 0.2 x10E3/uL   Immature Granulocytes 0 Not Estab. %   Immature Grans (Abs) 0.0 0.0 - 0.1 x10E3/uL  Comprehensive metabolic panel  Result Value Ref Range   Glucose 97 65 - 99 mg/dL   BUN 21 8 - 27 mg/dL   Creatinine, Ser 0.80 0.57 - 1.00 mg/dL   GFR calc non Af Amer 78 >59 mL/min/1.73   GFR calc Af Amer 89 >59 mL/min/1.73   BUN/Creatinine Ratio 26 12 - 28   Sodium 140 134 - 144 mmol/L   Potassium 4.5 3.5 - 5.2 mmol/L   Chloride 103 96 - 106 mmol/L   CO2 22 20 - 29 mmol/L   Calcium 9.3 8.7 - 10.3 mg/dL   Total Protein 6.7 6.0 - 8.5 g/dL   Albumin 4.3 3.6 - 4.8 g/dL   Globulin, Total 2.4 1.5 - 4.5 g/dL   Albumin/Globulin Ratio 1.8 1.2 - 2.2   Bilirubin Total 0.6 0.0 - 1.2 mg/dL   Alkaline Phosphatase 106 39 - 117 IU/L   AST 31 0 - 40 IU/L   ALT 43 (H) 0 - 32 IU/L  Thyroid Panel With TSH  Result Value Ref Range   TSH 2.130 0.450 - 4.500 uIU/mL   T4, Total 8.8 4.5 - 12.0 ug/dL   T3 Uptake Ratio 26 24 - 39 %   Free Thyroxine Index 2.3 1.2 - 4.9  VITAMIN D 25 Hydroxy (Vit-D Deficiency, Fractures)  Result Value Ref Range   Vit D, 25-Hydroxy 26.2 (L) 30.0 - 100.0 ng/mL  Lyme Ab/Western Blot Reflex  Result Value Ref Range   Lyme IgG/IgM Ab <0.91 0.00 - 0.90 ISR   LYME DISEASE AB, QUANT, IGM <0.80 0.00 - 0.79 index  Rocky mtn spotted fvr abs pnl(IgG+IgM)  Result Value Ref Range   RMSF IgG Negative Negative   RMSF IgM 0.32 0.00 - 1.61 index  Ehrlichia Antibody Panel  Result Value Ref Range   E.Chaffeensis (HME) IgG Negative Neg:<1:64   E. Chaffeensis (HME) IgM Titer Negative Neg:<1:20   HGE IgG Titer Negative Neg:<1:64   HGE IgM Titer Negative Neg:<1:20  Babesia microti  Antibody Panel  Result Value Ref Range   Babesia microti IgM <1:10 Neg:<1:10   Babesia microti IgG <1:10 Neg:<1:10      Assessment & Plan:   Problem List Items Addressed This Visit    None    Visit Diagnoses    Acute bronchitis, unspecified  organism    -  Primary   Improving, but still wheezy. Will continue antibiotics, take it easy and recheck 2 weeks.    Clogged ear, left       Very dry EAC- will start mineral oil. Call if not getting better or getting worse.        Follow up plan: Return in about 2 weeks (around 06/06/2017) for Lung recheck.

## 2017-06-06 ENCOUNTER — Ambulatory Visit: Payer: PPO | Admitting: Family Medicine

## 2017-06-08 ENCOUNTER — Encounter: Payer: Self-pay | Admitting: Family Medicine

## 2017-06-08 ENCOUNTER — Ambulatory Visit (INDEPENDENT_AMBULATORY_CARE_PROVIDER_SITE_OTHER): Payer: PPO | Admitting: Family Medicine

## 2017-06-08 VITALS — BP 125/81 | HR 75 | Temp 98.5°F | Wt 209.4 lb

## 2017-06-08 DIAGNOSIS — J209 Acute bronchitis, unspecified: Secondary | ICD-10-CM

## 2017-06-08 NOTE — Progress Notes (Signed)
BP 125/81 (BP Location: Left Arm, Patient Position: Sitting, Cuff Size: Normal)   Pulse 75   Temp 98.5 F (36.9 C)   Wt 209 lb 6 oz (95 kg)   SpO2 98%   BMI 30.92 kg/m    Subjective:    Patient ID: Mckenzie Webster, female    DOB: 13-Sep-1951, 66 y.o.   MRN: [id number]  HPI: Mckenzie Webster is a 66 y.o. female  Chief Complaint  Patient presents with  . Lung recheck   Feeling much better! Closer to her normal self! She is still feeling a bit tired and it's taking a bit longer to do things. No more wheezing. No pain in her lungs. She is otherwise feeling well with no other concerns or complaints at this time.   Relevant past medical, surgical, family and social history reviewed and updated as indicated. Interim medical history since our last visit reviewed. Allergies and medications reviewed and updated.  Review of Systems  Constitutional: Negative.   Respiratory: Negative.   Cardiovascular: Negative.   Psychiatric/Behavioral: Negative.     Per HPI unless specifically indicated above     Objective:    BP 125/81 (BP Location: Left Arm, Patient Position: Sitting, Cuff Size: Normal)   Pulse 75   Temp 98.5 F (36.9 C)   Wt 209 lb 6 oz (95 kg)   SpO2 98%   BMI 30.92 kg/m   Wt Readings from Last 3 Encounters:  06/08/17 209 lb 6 oz (95 kg)  05/23/17 210 lb 9 oz (95.5 kg)  05/16/17 204 lb 6 oz (92.7 kg)    Physical Exam  Constitutional: She is oriented to person, place, and time. She appears well-developed and well-nourished. No distress.  HENT:  Head: Normocephalic and atraumatic.  Right Ear: Hearing normal.  Left Ear: Hearing normal.  Nose: Nose normal.  Eyes: Conjunctivae and lids are normal. Right eye exhibits no discharge. Left eye exhibits no discharge. No scleral icterus.  Cardiovascular: Normal rate, regular rhythm, normal heart sounds and intact distal pulses.  Exam reveals no gallop and no friction rub.   No murmur heard. Pulmonary/Chest: Effort normal and  breath sounds normal. No respiratory distress. She has no wheezes. She has no rales. She exhibits no tenderness.  Musculoskeletal: Normal range of motion.  Neurological: She is alert and oriented to person, place, and time.  Skin: Skin is warm, dry and intact. No rash noted. She is not diaphoretic. No erythema. No pallor.  Psychiatric: She has a normal mood and affect. Her speech is normal and behavior is normal. Judgment and thought content normal. Cognition and memory are normal.  Nursing note and vitals reviewed.   Results for orders placed or performed in visit on 05/16/17  CBC with Differential/Platelet  Result Value Ref Range   WBC 11.3 (H) 3.4 - 10.8 x10E3/uL   RBC 4.85 3.77 - 5.28 x10E6/uL   Hemoglobin 14.4 11.1 - 15.9 g/dL   Hematocrit 43.6 34.0 - 46.6 %   MCV 90 79 - 97 fL   MCH 29.7 26.6 - 33.0 pg   MCHC 33.0 31.5 - 35.7 g/dL   RDW 14.0 12.3 - 15.4 %   Platelets 287 150 - 379 x10E3/uL   Neutrophils 85 Not Estab. %   Lymphs 10 Not Estab. %   Monocytes 5 Not Estab. %   Eos 0 Not Estab. %   Basos 0 Not Estab. %   Neutrophils Absolute 9.6 (H) 1.4 - 7.0 x10E3/uL   Lymphocytes Absolute  1.1 0.7 - 3.1 x10E3/uL   Monocytes Absolute 0.5 0.1 - 0.9 x10E3/uL   EOS (ABSOLUTE) 0.0 0.0 - 0.4 x10E3/uL   Basophils Absolute 0.0 0.0 - 0.2 x10E3/uL   Immature Granulocytes 0 Not Estab. %   Immature Grans (Abs) 0.0 0.0 - 0.1 x10E3/uL  Comprehensive metabolic panel  Result Value Ref Range   Glucose 97 65 - 99 mg/dL   BUN 21 8 - 27 mg/dL   Creatinine, Ser 0.80 0.57 - 1.00 mg/dL   GFR calc non Af Amer 78 >59 mL/min/1.73   GFR calc Af Amer 89 >59 mL/min/1.73   BUN/Creatinine Ratio 26 12 - 28   Sodium 140 134 - 144 mmol/L   Potassium 4.5 3.5 - 5.2 mmol/L   Chloride 103 96 - 106 mmol/L   CO2 22 20 - 29 mmol/L   Calcium 9.3 8.7 - 10.3 mg/dL   Total Protein 6.7 6.0 - 8.5 g/dL   Albumin 4.3 3.6 - 4.8 g/dL   Globulin, Total 2.4 1.5 - 4.5 g/dL   Albumin/Globulin Ratio 1.8 1.2 - 2.2    Bilirubin Total 0.6 0.0 - 1.2 mg/dL   Alkaline Phosphatase 106 39 - 117 IU/L   AST 31 0 - 40 IU/L   ALT 43 (H) 0 - 32 IU/L  Thyroid Panel With TSH  Result Value Ref Range   TSH 2.130 0.450 - 4.500 uIU/mL   T4, Total 8.8 4.5 - 12.0 ug/dL   T3 Uptake Ratio 26 24 - 39 %   Free Thyroxine Index 2.3 1.2 - 4.9  VITAMIN D 25 Hydroxy (Vit-D Deficiency, Fractures)  Result Value Ref Range   Vit D, 25-Hydroxy 26.2 (L) 30.0 - 100.0 ng/mL  Lyme Ab/Western Blot Reflex  Result Value Ref Range   Lyme IgG/IgM Ab <0.91 0.00 - 0.90 ISR   LYME DISEASE AB, QUANT, IGM <0.80 0.00 - 0.79 index  Rocky mtn spotted fvr abs pnl(IgG+IgM)  Result Value Ref Range   RMSF IgG Negative Negative   RMSF IgM 0.32 0.00 - 0.86 index  Ehrlichia Antibody Panel  Result Value Ref Range   E.Chaffeensis (HME) IgG Negative Neg:<1:64   E. Chaffeensis (HME) IgM Titer Negative Neg:<1:20   HGE IgG Titer Negative Neg:<1:64   HGE IgM Titer Negative Neg:<1:20  Babesia microti Antibody Panel  Result Value Ref Range   Babesia microti IgM <1:10 Neg:<1:10   Babesia microti IgG <1:10 Neg:<1:10      Assessment & Plan:   Problem List Items Addressed This Visit    None    Visit Diagnoses    Acute bronchitis, unspecified organism    -  Primary   Resolved. Ease back into activities. If not feeling herself in 3 weeks, call and we will do a spiro.        Follow up plan: Return Around Oct 18, for Follow up binge eating.

## 2017-08-17 ENCOUNTER — Encounter: Payer: Self-pay | Admitting: Family Medicine

## 2017-08-17 ENCOUNTER — Ambulatory Visit (INDEPENDENT_AMBULATORY_CARE_PROVIDER_SITE_OTHER): Payer: PPO | Admitting: Family Medicine

## 2017-08-17 VITALS — BP 137/87 | HR 75 | Temp 98.6°F | Wt 207.2 lb

## 2017-08-17 DIAGNOSIS — F5081 Binge eating disorder: Secondary | ICD-10-CM

## 2017-08-17 DIAGNOSIS — Z23 Encounter for immunization: Secondary | ICD-10-CM

## 2017-08-17 MED ORDER — LISDEXAMFETAMINE DIMESYLATE 60 MG PO CAPS: 60.0000 mg | ORAL_CAPSULE | Freq: Every day | ORAL | 0 refills | Status: DC

## 2017-08-17 NOTE — Progress Notes (Signed)
BP 137/87 (BP Location: Left Arm, Patient Position: Sitting, Cuff Size: Large)   Pulse 75   Temp 98.6 F (37 C)   Wt 207 lb 4 oz (94 kg)   SpO2 98%   BMI 30.61 kg/m    Subjective:    Patient ID: Mckenzie Webster, female    DOB: Dec 11, 1950, 66 y.o.   MRN: [id number]  HPI: Mckenzie Webster is a 66 y.o. female  Chief Complaint  Patient presents with  . binge eating   Feeling well. She notes that she is tolerating the vyvanse well. She is feeling well. She notes that it is still helping with the binging and she really notices it when she misses a dose. She is otherwise doing well with no other concerns or complaints at this time.   Relevant past medical, surgical, family and social history reviewed and updated as indicated. Interim medical history since our last visit reviewed. Allergies and medications reviewed and updated.  Review of Systems  Constitutional: Negative.   Respiratory: Negative.   Cardiovascular: Negative.   Psychiatric/Behavioral: Negative.     Per HPI unless specifically indicated above     Objective:    BP 137/87 (BP Location: Left Arm, Patient Position: Sitting, Cuff Size: Large)   Pulse 75   Temp 98.6 F (37 C)   Wt 207 lb 4 oz (94 kg)   SpO2 98%   BMI 30.61 kg/m   Wt Readings from Last 3 Encounters:  08/17/17 207 lb 4 oz (94 kg)  06/08/17 209 lb 6 oz (95 kg)  05/23/17 210 lb 9 oz (95.5 kg)    Physical Exam  Constitutional: She is oriented to person, place, and time. She appears well-developed and well-nourished. No distress.  HENT:  Head: Normocephalic and atraumatic.  Right Ear: Hearing normal.  Left Ear: Hearing normal.  Nose: Nose normal.  Eyes: Conjunctivae and lids are normal. Right eye exhibits no discharge. Left eye exhibits no discharge. No scleral icterus.  Cardiovascular: Normal rate, regular rhythm, normal heart sounds and intact distal pulses.  Exam reveals no gallop and no friction rub.   No murmur heard. Pulmonary/Chest: Effort  normal and breath sounds normal. No respiratory distress. She has no wheezes. She has no rales. She exhibits no tenderness.  Musculoskeletal: Normal range of motion.  Neurological: She is alert and oriented to person, place, and time.  Skin: Skin is warm, dry and intact. No rash noted. She is not diaphoretic. No erythema. No pallor.  Psychiatric: She has a normal mood and affect. Her speech is normal and behavior is normal. Judgment and thought content normal. Cognition and memory are normal.  Nursing note and vitals reviewed.   Results for orders placed or performed in visit on 05/16/17  CBC with Differential/Platelet  Result Value Ref Range   WBC 11.3 (H) 3.4 - 10.8 x10E3/uL   RBC 4.85 3.77 - 5.28 x10E6/uL   Hemoglobin 14.4 11.1 - 15.9 g/dL   Hematocrit 43.6 34.0 - 46.6 %   MCV 90 79 - 97 fL   MCH 29.7 26.6 - 33.0 pg   MCHC 33.0 31.5 - 35.7 g/dL   RDW 14.0 12.3 - 15.4 %   Platelets 287 150 - 379 x10E3/uL   Neutrophils 85 Not Estab. %   Lymphs 10 Not Estab. %   Monocytes 5 Not Estab. %   Eos 0 Not Estab. %   Basos 0 Not Estab. %   Neutrophils Absolute 9.6 (H) 1.4 - 7.0 x10E3/uL  Lymphocytes Absolute 1.1 0.7 - 3.1 x10E3/uL   Monocytes Absolute 0.5 0.1 - 0.9 x10E3/uL   EOS (ABSOLUTE) 0.0 0.0 - 0.4 x10E3/uL   Basophils Absolute 0.0 0.0 - 0.2 x10E3/uL   Immature Granulocytes 0 Not Estab. %   Immature Grans (Abs) 0.0 0.0 - 0.1 x10E3/uL  Comprehensive metabolic panel  Result Value Ref Range   Glucose 97 65 - 99 mg/dL   BUN 21 8 - 27 mg/dL   Creatinine, Ser 0.80 0.57 - 1.00 mg/dL   GFR calc non Af Amer 78 >59 mL/min/1.73   GFR calc Af Amer 89 >59 mL/min/1.73   BUN/Creatinine Ratio 26 12 - 28   Sodium 140 134 - 144 mmol/L   Potassium 4.5 3.5 - 5.2 mmol/L   Chloride 103 96 - 106 mmol/L   CO2 22 20 - 29 mmol/L   Calcium 9.3 8.7 - 10.3 mg/dL   Total Protein 6.7 6.0 - 8.5 g/dL   Albumin 4.3 3.6 - 4.8 g/dL   Globulin, Total 2.4 1.5 - 4.5 g/dL   Albumin/Globulin Ratio 1.8 1.2 -  2.2   Bilirubin Total 0.6 0.0 - 1.2 mg/dL   Alkaline Phosphatase 106 39 - 117 IU/L   AST 31 0 - 40 IU/L   ALT 43 (H) 0 - 32 IU/L  Thyroid Panel With TSH  Result Value Ref Range   TSH 2.130 0.450 - 4.500 uIU/mL   T4, Total 8.8 4.5 - 12.0 ug/dL   T3 Uptake Ratio 26 24 - 39 %   Free Thyroxine Index 2.3 1.2 - 4.9  VITAMIN D 25 Hydroxy (Vit-D Deficiency, Fractures)  Result Value Ref Range   Vit D, 25-Hydroxy 26.2 (L) 30.0 - 100.0 ng/mL  Lyme Ab/Western Blot Reflex  Result Value Ref Range   Lyme IgG/IgM Ab <0.91 0.00 - 0.90 ISR   LYME DISEASE AB, QUANT, IGM <0.80 0.00 - 0.79 index  Rocky mtn spotted fvr abs pnl(IgG+IgM)  Result Value Ref Range   RMSF IgG Negative Negative   RMSF IgM 0.32 0.00 - 0.93 index  Ehrlichia Antibody Panel  Result Value Ref Range   E.Chaffeensis (HME) IgG Negative Neg:<1:64   E. Chaffeensis (HME) IgM Titer Negative Neg:<1:20   HGE IgG Titer Negative Neg:<1:64   HGE IgM Titer Negative Neg:<1:20  Babesia microti Antibody Panel  Result Value Ref Range   Babesia microti IgM <1:10 Neg:<1:10   Babesia microti IgG <1:10 Neg:<1:10      Assessment & Plan:   Problem List Items Addressed This Visit      Other   Binge eating disorder - Primary    Stable on current regimen. Refills for 4 months  To get to her physical given. Call with any concerns.       Relevant Medications   lisdexamfetamine (VYVANSE) 60 MG capsule    Other Visit Diagnoses    Immunization due       Flu shot given today.   Relevant Orders   Flu vaccine HIGH DOSE PF (Fluzone High dose) (Completed)       Follow up plan: Return After 11/24/17, for Wellness.

## 2017-08-17 NOTE — Patient Instructions (Addendum)

## 2017-08-17 NOTE — Assessment & Plan Note (Signed)
Stable on current regimen. Refills for 4 months  To get to her physical given. Call with any concerns.

## 2017-11-27 NOTE — Progress Notes (Deleted)
There were no vitals taken for this visit.   Subjective:    Patient ID: Mckenzie Webster, female    DOB: Apr 08, 1951, 67 y.o.   MRN: [id number]  HPI: Mckenzie Webster is a 67 y.o. female presenting on 11/28/2017 for comprehensive medical examination. Current medical complaints include:{Blank single:19197::"none","***"}  She currently lives with: Menopausal Symptoms: {Blank single:19197::"yes","no"}  Depression Screen done today and results listed below:  Depression screen [facility] 2/9 11/24/2016 10/13/2016 01/04/2016  Decreased Interest 0 0 2  Down, Depressed, Hopeless 0 0 2  PHQ - 2 Score 0 0 4  Altered sleeping - - 2  Tired, decreased energy - - 3  Change in appetite - - 3  Feeling bad or failure about yourself  - - 3  Trouble concentrating - - 1  Moving slowly or fidgety/restless - - 0  Suicidal thoughts - - 0  PHQ-9 Score - - 16  Difficult doing work/chores - - Somewhat difficult    The patient {has/does not have:19849} a history of falls. I {did/did not:19850} complete a risk assessment for falls. A plan of care for falls {was/was not:19852} documented.   Past Medical History:  Past Medical History:  Diagnosis Date  . Anemia   . Cataract   . Dry eye syndrome   . Stomach ulcer     Surgical History:  Past Surgical History:  Procedure Laterality Date  . COLONOSCOPY WITH PROPOFOL N/A 04/27/2017   Procedure: COLONOSCOPY WITH PROPOFOL;  Surgeon: Jonathon Bellows, MD;  Location: [facility] ENDOSCOPY;  Service: Endoscopy;  Laterality: N/A;  . EYE SURGERY     Cataract  . FOOT SURGERY     Change the alignment of the toes to stop the formation of corns  . gastric bypass  1981    Medications:  Current Outpatient Medications on File Prior to Visit  Medication Sig  . albuterol (PROVENTIL HFA;VENTOLIN HFA) 108 (90 Base) MCG/ACT inhaler Inhale 2 puffs into the lungs every 6 (six) hours as needed for wheezing or shortness of breath. (Patient not taking: Reported on 08/17/2017)  . alendronate  (FOSAMAX) 70 MG tablet Take 1 tablet (70 mg total) by mouth once a week. Take with a full glass of water on an empty stomach.  . Calcium-Vitamin D-Vitamin K (CALCIUM + D + K PO) Take by mouth. 750mg  calcium, 500 vitamin d and 40mg   . ferrous sulfate 325 (65 FE) MG EC tablet Take 1 tablet (325 mg total) by mouth 3 (three) times daily with meals. (Patient taking differently: Take 325 mg by mouth daily. )  . lisdexamfetamine (VYVANSE) 60 MG capsule Take 1 capsule (60 mg total) by mouth daily.  . Multiple Vitamins-Minerals (CENTRUM SILVER PO) Take by mouth.  . naproxen sodium (ANAPROX) 220 MG tablet Take 440 mg by mouth daily.  [name] Kitchen OVER THE COUNTER MEDICATION More Free Ultra- Joint health  . ranitidine (ZANTAC) 150 MG tablet Take 150 mg by mouth 2 (two) times daily.    No current facility-administered medications on file prior to visit.     Allergies:  No Known Allergies  Social History:  Social History   Socioeconomic History  . Marital status: Divorced    Spouse name: Not on file  . Number of children: Not on file  . Years of education: Not on file  . Highest education level: Not on file  Social Needs  . Financial resource strain: Not on file  . Food insecurity - worry: Not on file  . Food insecurity - inability:  Not on file  . Transportation needs - medical: Not on file  . Transportation needs - non-medical: Not on file  Occupational History  . Not on file  Tobacco Use  . Smoking status: Former Smoker    Last attempt to quit: 10/31/1995    Years since quitting: 22.0  . Smokeless tobacco: Former Network engineer and Sexual Activity  . Alcohol use: Yes    Comment: on occasion  . Drug use: No  . Sexual activity: Not Currently  Other Topics Concern  . Not on file  Social History Narrative  . Not on file   Social History   Tobacco Use  Smoking Status Former Smoker  . Last attempt to quit: 10/31/1995  . Years since quitting: 22.0  Smokeless Tobacco Former Systems developer   Social  History   Substance and Sexual Activity  Alcohol Use Yes   Comment: on occasion    Family History:  Family History  Problem Relation Age of Onset  . Heart disease Mother   . COPD Mother   . Heart disease Father   . Hypertension Father   . Heart disease Maternal Grandfather   . Alzheimer's disease Paternal Grandmother   . Breast cancer Neg Hx     Past medical history, surgical history, medications, allergies, family history and social history reviewed with patient today and changes made to appropriate areas of the chart.   Review of Systems - {ros master:310782} All other ROS negative except what is listed above and in the HPI.      Objective:    There were no vitals taken for this visit.  Wt Readings from Last 3 Encounters:  08/17/17 207 lb 4 oz (94 kg)  06/08/17 209 lb 6 oz (95 kg)  05/23/17 210 lb 9 oz (95.5 kg)    Physical Exam  Results for orders placed or performed in visit on 05/16/17  CBC with Differential/Platelet  Result Value Ref Range   WBC 11.3 (H) 3.4 - 10.8 x10E3/uL   RBC 4.85 3.77 - 5.28 x10E6/uL   Hemoglobin 14.4 11.1 - 15.9 g/dL   Hematocrit 43.6 34.0 - 46.6 %   MCV 90 79 - 97 fL   MCH 29.7 26.6 - 33.0 pg   MCHC 33.0 31.5 - 35.7 g/dL   RDW 14.0 12.3 - 15.4 %   Platelets 287 150 - 379 x10E3/uL   Neutrophils 85 Not Estab. %   Lymphs 10 Not Estab. %   Monocytes 5 Not Estab. %   Eos 0 Not Estab. %   Basos 0 Not Estab. %   Neutrophils Absolute 9.6 (H) 1.4 - 7.0 x10E3/uL   Lymphocytes Absolute 1.1 0.7 - 3.1 x10E3/uL   Monocytes Absolute 0.5 0.1 - 0.9 x10E3/uL   EOS (ABSOLUTE) 0.0 0.0 - 0.4 x10E3/uL   Basophils Absolute 0.0 0.0 - 0.2 x10E3/uL   Immature Granulocytes 0 Not Estab. %   Immature Grans (Abs) 0.0 0.0 - 0.1 x10E3/uL  Comprehensive metabolic panel  Result Value Ref Range   Glucose 97 65 - 99 mg/dL   BUN 21 8 - 27 mg/dL   Creatinine, Ser 0.80 0.57 - 1.00 mg/dL   GFR calc non Af Amer 78 >59 mL/min/1.73   GFR calc Af Amer 89 >59  mL/min/1.73   BUN/Creatinine Ratio 26 12 - 28   Sodium 140 134 - 144 mmol/L   Potassium 4.5 3.5 - 5.2 mmol/L   Chloride 103 96 - 106 mmol/L   CO2 22 20 - 29 mmol/L  Calcium 9.3 8.7 - 10.3 mg/dL   Total Protein 6.7 6.0 - 8.5 g/dL   Albumin 4.3 3.6 - 4.8 g/dL   Globulin, Total 2.4 1.5 - 4.5 g/dL   Albumin/Globulin Ratio 1.8 1.2 - 2.2   Bilirubin Total 0.6 0.0 - 1.2 mg/dL   Alkaline Phosphatase 106 39 - 117 IU/L   AST 31 0 - 40 IU/L   ALT 43 (H) 0 - 32 IU/L  Thyroid Panel With TSH  Result Value Ref Range   TSH 2.130 0.450 - 4.500 uIU/mL   T4, Total 8.8 4.5 - 12.0 ug/dL   T3 Uptake Ratio 26 24 - 39 %   Free Thyroxine Index 2.3 1.2 - 4.9  VITAMIN D 25 Hydroxy (Vit-D Deficiency, Fractures)  Result Value Ref Range   Vit D, 25-Hydroxy 26.2 (L) 30.0 - 100.0 ng/mL  Lyme Ab/Western Blot Reflex  Result Value Ref Range   Lyme IgG/IgM Ab <0.91 0.00 - 0.90 ISR   LYME DISEASE AB, QUANT, IGM <0.80 0.00 - 0.79 index  Rocky mtn spotted fvr abs pnl(IgG+IgM)  Result Value Ref Range   RMSF IgG Negative Negative   RMSF IgM 0.32 0.00 - 6.27 index  Ehrlichia Antibody Panel  Result Value Ref Range   E.Chaffeensis (HME) IgG Negative Neg:<1:64   E. Chaffeensis (HME) IgM Titer Negative Neg:<1:20   HGE IgG Titer Negative Neg:<1:64   HGE IgM Titer Negative Neg:<1:20  Babesia microti Antibody Panel  Result Value Ref Range   Babesia microti IgM <1:10 Neg:<1:10   Babesia microti IgG <1:10 Neg:<1:10      Assessment & Plan:   Problem List Items Addressed This Visit      Digestive   GERD without esophagitis     Other   Iron deficiency anemia   Binge eating disorder    Other Visit Diagnoses    Routine general medical examination at a health care facility    -  Primary       Follow up plan: No Follow-up on file.   LABORATORY TESTING:  - Pap smear: {Blank OJJKKX:38182::"XHB done","not applicable","up to date","done elsewhere"}  IMMUNIZATIONS:   - Tdap: Tetanus vaccination status  reviewed: {tetanus status:315746}. - Influenza: {Blank single:19197::"Up to date","Administered today","Postponed to flu season","Refused","Given elsewhere"} - Pneumovax: {Blank single:19197::"Up to date","Administered today","Not applicable","Refused","Given elsewhere"} - Prevnar: {Blank single:19197::"Up to date","Administered today","Not applicable","Refused","Given elsewhere"} - Zostavax vaccine: {Blank single:19197::"Up to date","Administered today","Not applicable","Refused","Given elsewhere"}  SCREENING: -Mammogram: {Blank single:19197::"Up to date","Ordered today","Not applicable","Refused","Done elsewhere"}  - Colonoscopy: {Blank single:19197::"Up to date","Ordered today","Not applicable","Refused","Done elsewhere"}  - Bone Density: {Blank single:19197::"Up to date","Ordered today","Not applicable","Refused","Done elsewhere"}  -Hearing Test: {Blank single:19197::"Up to date","Ordered today","Not applicable","Refused","Done elsewhere"}  -Spirometry: {Blank single:19197::"Up to date","Ordered today","Not applicable","Refused","Done elsewhere"}   PATIENT COUNSELING:   Advised to take 1 mg of folate supplement per day if capable of pregnancy.   Sexuality: Discussed sexually transmitted diseases, partner selection, use of condoms, avoidance of unintended pregnancy  and contraceptive alternatives.   Advised to avoid cigarette smoking.  I discussed with the patient that most people either abstain from alcohol or drink within safe limits (<=14/week and <=4 drinks/occasion for males, <=7/weeks and <= 3 drinks/occasion for females) and that the risk for alcohol disorders and other health effects rises proportionally with the number of drinks per week and how often a drinker exceeds daily limits.  Discussed cessation/primary prevention of drug use and availability of treatment for abuse.   Diet: Encouraged to adjust caloric intake to maintain  or achieve ideal body weight, to reduce  intake of  dietary saturated fat and total fat, to limit sodium intake by avoiding high sodium foods and not adding table salt, and to maintain adequate dietary potassium and calcium preferably from fresh fruits, vegetables, and low-fat dairy products.    stressed the importance of regular exercise  Injury prevention: Discussed safety belts, safety helmets, smoke detector, smoking near bedding or upholstery.   Dental health: Discussed importance of regular tooth brushing, flossing, and dental visits.    NEXT PREVENTATIVE PHYSICAL DUE IN 1 YEAR. No Follow-up on file.

## 2017-11-28 ENCOUNTER — Encounter: Payer: Self-pay | Admitting: Family Medicine

## 2017-11-28 ENCOUNTER — Ambulatory Visit (INDEPENDENT_AMBULATORY_CARE_PROVIDER_SITE_OTHER): Payer: PPO | Admitting: Family Medicine

## 2017-11-28 ENCOUNTER — Ambulatory Visit (INDEPENDENT_AMBULATORY_CARE_PROVIDER_SITE_OTHER): Payer: PPO

## 2017-11-28 VITALS — BP 126/84 | HR 71 | Temp 98.5°F | Resp 16 | Ht 70.0 in | Wt 211.4 lb

## 2017-11-28 VITALS — BP 126/84 | HR 71 | Temp 98.5°F | Ht 69.3 in | Wt 211.4 lb

## 2017-11-28 DIAGNOSIS — Z0000 Encounter for general adult medical examination without abnormal findings: Secondary | ICD-10-CM

## 2017-11-28 DIAGNOSIS — Z23 Encounter for immunization: Secondary | ICD-10-CM | POA: Diagnosis not present

## 2017-11-28 DIAGNOSIS — K219 Gastro-esophageal reflux disease without esophagitis: Secondary | ICD-10-CM | POA: Diagnosis not present

## 2017-11-28 DIAGNOSIS — D509 Iron deficiency anemia, unspecified: Secondary | ICD-10-CM | POA: Diagnosis not present

## 2017-11-28 DIAGNOSIS — Z1231 Encounter for screening mammogram for malignant neoplasm of breast: Secondary | ICD-10-CM | POA: Diagnosis not present

## 2017-11-28 DIAGNOSIS — M810 Age-related osteoporosis without current pathological fracture: Secondary | ICD-10-CM

## 2017-11-28 DIAGNOSIS — Z1239 Encounter for other screening for malignant neoplasm of breast: Secondary | ICD-10-CM

## 2017-11-28 DIAGNOSIS — F5081 Binge eating disorder: Secondary | ICD-10-CM | POA: Diagnosis not present

## 2017-11-28 DIAGNOSIS — M25552 Pain in left hip: Secondary | ICD-10-CM | POA: Diagnosis not present

## 2017-11-28 MED ORDER — FERROUS SULFATE 325 (65 FE) MG PO TBEC: 325.0000 mg | DELAYED_RELEASE_TABLET | Freq: Every day | ORAL | 3 refills | Status: DC

## 2017-11-28 MED ORDER — LISDEXAMFETAMINE DIMESYLATE 60 MG PO CAPS: 60.0000 mg | ORAL_CAPSULE | Freq: Every day | ORAL | 0 refills | Status: DC

## 2017-11-28 MED ORDER — RANITIDINE HCL 150 MG PO TABS: 300.0000 mg | ORAL_TABLET | Freq: Every day | ORAL | 3 refills | Status: DC

## 2017-11-28 MED ORDER — NAPROXEN 500 MG PO TBEC: 500.0000 mg | DELAYED_RELEASE_TABLET | Freq: Two times a day (BID) | ORAL | 3 refills | Status: DC

## 2017-11-28 NOTE — Progress Notes (Signed)
Subjective:   Mckenzie Webster is a 67 y.o. female who presents for Medicare Annual (Subsequent) preventive examination.  Review of Systems:  Cardiac Risk Factors include: advanced age (>27men, >39 women);obesity (BMI >30kg/m2)     Objective:     Vitals: BP 126/84 (BP Location: Left Arm, Patient Position: Sitting)   Pulse 71   Temp 98.5 F (36.9 C) (Oral)   Resp 16   Ht 5\' 10"  (1.778 m)   Wt 211 lb 7 oz (95.9 kg)   SpO2 100%   BMI 30.34 kg/m   Body mass index is 30.34 kg/m.  Advanced Directives 11/28/2017 04/27/2017 11/24/2016 04/07/2016 01/06/2016  Does Patient Have a Medical Advance Directive? Yes No No No No  Does patient want to make changes to medical advance directive? Yes (MAU/Ambulatory/Procedural Areas - Information given) - - - -  Would patient like information on creating a medical advance directive? - - Yes (MAU/Ambulatory/Procedural Areas - Information given) - -  Pre-existing out of facility DNR order (yellow form or pink MOST form) - Physician notified to receive inpatient order - - -    Tobacco Social History   Tobacco Use  Smoking Status Former Smoker  . Last attempt to quit: 10/31/1995  . Years since quitting: 22.0  Smokeless Tobacco Former Engineer, structural given: Not Answered   Clinical Intake:  Pre-visit preparation completed: Yes  Pain : No/denies pain     Nutritional Status: BMI > 30  Obese Nutritional Risks: None Diabetes: No  How often do you need to have someone help you when you read instructions, pamphlets, or other written materials from your doctor or pharmacy?: 1 - Never  Interpreter Needed?: No  Information entered by :: TIffany Hill,LPN   Past Medical History:  Diagnosis Date  . Anemia   . Cataract   . Dry eye syndrome   . Stomach ulcer    Past Surgical History:  Procedure Laterality Date  . COLONOSCOPY WITH PROPOFOL N/A 04/27/2017   Procedure: COLONOSCOPY WITH PROPOFOL;  Surgeon: Jonathon Bellows, MD;  Location: [facility]  ENDOSCOPY;  Service: Endoscopy;  Laterality: N/A;  . EYE SURGERY     Cataract  . FOOT SURGERY     Change the alignment of the toes to stop the formation of corns  . gastric bypass  1981   Family History  Problem Relation Age of Onset  . Heart disease Mother   . COPD Mother   . Heart disease Father   . Hypertension Father   . Heart disease Maternal Grandfather   . Alzheimer's disease Paternal Grandmother   . Breast cancer Neg Hx    Social History   Socioeconomic History  . Marital status: Divorced    Spouse name: None  . Number of children: None  . Years of education: None  . Highest education level: None  Social Needs  . Financial resource strain: Not hard at all  . Food insecurity - worry: Never true  . Food insecurity - inability: Never true  . Transportation needs - medical: No  . Transportation needs - non-medical: No  Occupational History  . None  Tobacco Use  . Smoking status: Former Smoker    Last attempt to quit: 10/31/1995    Years since quitting: 22.0  . Smokeless tobacco: Former Network engineer and Sexual Activity  . Alcohol use: Yes    Comment: on occasion  . Drug use: No  . Sexual activity: Not Currently  Other Topics Concern  .  None  Social History Narrative  . None    Outpatient Encounter Medications as of 11/28/2017  Medication Sig  . alendronate (FOSAMAX) 70 MG tablet Take 1 tablet (70 mg total) by mouth once a week. Take with a full glass of water on an empty stomach.  . naproxen sodium (ANAPROX) 220 MG tablet Take 440 mg by mouth daily.  . [DISCONTINUED] ferrous sulfate 325 (65 FE) MG EC tablet Take 1 tablet (325 mg total) by mouth 3 (three) times daily with meals. (Patient taking differently: Take 325 mg by mouth daily. )  . [DISCONTINUED] lisdexamfetamine (VYVANSE) 60 MG capsule Take 1 capsule (60 mg total) by mouth daily.  . [DISCONTINUED] ranitidine (ZANTAC) 150 MG tablet Take 300 mg by mouth daily.    No facility-administered encounter  medications on file as of 11/28/2017.     Activities of Daily Living In your present state of health, do you have any difficulty performing the following activities: 11/28/2017 11/28/2017  Hearing? N N  Vision? N N  Difficulty concentrating or making decisions? N N  Walking or climbing stairs? Y Y  Dressing or bathing? N N  Doing errands, shopping? N N  Preparing Food and eating ? N -  Using the Toilet? N -  In the past six months, have you accidently leaked urine? N -  Do you have problems with loss of bowel control? N -  Managing your Medications? N -  Managing your Finances? N -  Housekeeping or managing your Housekeeping? N -  Some recent data might be hidden    Patient Care Team: Valerie Roys, DO as PCP - General (Family Medicine) Conception Chancy, DDS (Dentistry)    Assessment:   This is a routine wellness examination for [place].  Exercise Activities and Dietary recommendations Current Exercise Habits: The patient has a physically strenous job, but has no regular exercise apart from work., Exercise limited by: None identified  Goals    . DIET - INCREASE WATER INTAKE     Recommend drinking at least 6-8 glasses of water a day        Fall Risk Fall Risk  11/28/2017 11/28/2017 11/24/2016 10/13/2016  Falls in the past year? No No No No   Is the patient's home free of loose throw rugs in walkways, pet beds, electrical cords, etc?   yes      Grab bars in the bathroom? no      Handrails on the stairs?   yes      Adequate lighting?   yes  Timed Get Up and Go performed: Completed in 8 seconds with no use of assistive devices, steady gait. No intervention needed at this time.   Depression Screen PHQ 2/9 Scores 11/28/2017 11/28/2017 11/24/2016 10/13/2016  PHQ - 2 Score 0 0 0 0  PHQ- 9 Score 3 3 - -     Cognitive Function     6CIT Screen 11/28/2017 11/24/2016  What Year? 0 points 0 points  What month? 0 points 0 points  What time? 0 points 0 points  Count back from 20 0  points 0 points  Months in reverse 0 points 0 points  Repeat phrase 0 points 0 points  Total Score 0 0    Immunization History  Administered Date(s) Administered  . Influenza, High Dose Seasonal PF 08/17/2017  . Influenza,inj,Quad PF,6+ Mos 11/24/2016  . Pneumococcal Conjugate-13 11/24/2016  . Pneumococcal Polysaccharide-23 11/28/2017  . Tdap 02/16/2016  . Zoster 02/14/2017  . Zoster Recombinat (  Shingrix) 08/17/2017    Qualifies for Shingles Vaccine? Up to date  Screening Tests Health Maintenance  Topic Date Due  . MAMMOGRAM  01/11/2019  . COLONOSCOPY  04/27/2020  . TETANUS/TDAP  02/15/2026  . INFLUENZA VACCINE  Completed  . DEXA SCAN  Completed  . Hepatitis C Screening  Completed  . PNA vac Low Risk Adult  Completed    Cancer Screenings: Lung: Low Dose CT Chest recommended if Age 47-80 years, 30 pack-year currently smoking OR have quit w/in 15years. Patient does not qualify. Breast:  Up to date on Mammogram? Yes  , 01/02/2017 Up to date of Bone Density/Dexa? Yes, 01/10/2017 Colorectal: completed 04/27/2017  Additional Screenings:  Hepatitis B/HIV/Syphillis:not indicated Hepatitis C Screening:  Completed 11/24/2016     Plan:    I have personally reviewed and addressed the Medicare Annual Wellness questionnaire and have noted the following in the patient's chart:  A. Medical and social history B. Use of alcohol, tobacco or illicit drugs  C. Current medications and supplements D. Functional ability and status E.  Nutritional status F.  Physical activity G. Advance directives H. List of other physicians I.  Hospitalizations, surgeries, and ER visits in previous 12 months J.  [place] such as hearing and vision if needed, cognitive and depression L. Referrals and appointments   In addition, I have reviewed and discussed with patient certain preventive protocols, quality metrics, and best practice recommendations. A written personalized care plan for  preventive services as well as general preventive health recommendations were provided to patient.   Signed,  Tyler Aas, LPN Nurse Health Advisor   Nurse Notes:none

## 2017-11-28 NOTE — Patient Instructions (Signed)
Mckenzie Webster , Thank you for taking time to come for your Medicare Wellness Visit. I appreciate your ongoing commitment to your health goals. Please review the following plan we discussed and let me know if I can assist you in the future.   Screening recommendations/referrals: Colonoscopy: completed 04/27/2017 Mammogram: completed 01/02/2017 Bone Density: completed 01/10/2017 Recommended yearly ophthalmology/optometry visit for glaucoma screening and checkup Recommended yearly dental visit for hygiene and checkup  Vaccinations: Influenza vaccine: up to date Pneumococcal vaccine: completed series Tdap vaccine: up to date Shingles vaccine: up to date   Advanced directives: Advance directive discussed with you today. I have provided a copy for you to complete at home and have notarized. Once this is complete please bring a copy in to our office so we can scan it into your chart.  Conditions/risks identified: recommend drinking at least 6-8 glasses of water a day   Next appointment: Follow up in one year for your annual wellness exam.   Preventive Care 65 Years and Older, Female Preventive care refers to lifestyle choices and visits with your health care provider that can promote health and wellness. What does preventive care include?  A yearly physical exam. This is also called an annual well check.  Dental exams once or twice a year.  Routine eye exams. Ask your health care provider how often you should have your eyes checked.  Personal lifestyle choices, including:  Daily care of your teeth and gums.  Regular physical activity.  Eating a healthy diet.  Avoiding tobacco and drug use.  Limiting alcohol use.  Practicing safe sex.  Taking low-dose aspirin every day.  Taking vitamin and mineral supplements as recommended by your health care provider. What happens during an annual well check? The services and screenings done by your health care provider during your annual well  check will depend on your age, overall health, lifestyle risk factors, and family history of disease. Counseling  Your health care provider may ask you questions about your:  Alcohol use.  Tobacco use.  Drug use.  Emotional well-being.  Home and relationship well-being.  Sexual activity.  Eating habits.  History of falls.  Memory and ability to understand (cognition).  Work and work Statistician.  Reproductive health. Screening  You may have the following tests or measurements:  Height, weight, and BMI.  Blood pressure.  Lipid and cholesterol levels. These may be checked every 5 years, or more frequently if you are over 83 years old.  Skin check.  Lung cancer screening. You may have this screening every year starting at age 79 if you have a 30-pack-year history of smoking and currently smoke or have quit within the past 15 years.  Fecal occult blood test (FOBT) of the stool. You may have this test every year starting at age 33.  Flexible sigmoidoscopy or colonoscopy. You may have a sigmoidoscopy every 5 years or a colonoscopy every 10 years starting at age 60.  Hepatitis C blood test.  Hepatitis B blood test.  Sexually transmitted disease (STD) testing.  Diabetes screening. This is done by checking your blood sugar (glucose) after you have not eaten for a while (fasting). You may have this done every 1-3 years.  Bone density scan. This is done to screen for osteoporosis. You may have this done starting at age 70.  Mammogram. This may be done every 1-2 years. Talk to your health care provider about how often you should have regular mammograms. Talk with your health care provider about your  test results, treatment options, and if necessary, the need for more tests. Vaccines  Your health care provider may recommend certain vaccines, such as:  Influenza vaccine. This is recommended every year.  Tetanus, diphtheria, and acellular pertussis (Tdap, Td) vaccine. You  may need a Td booster every 10 years.  Zoster vaccine. You may need this after age 38.  Pneumococcal 13-valent conjugate (PCV13) vaccine. One dose is recommended after age 61.  Pneumococcal polysaccharide (PPSV23) vaccine. One dose is recommended after age 78. Talk to your health care provider about which screenings and vaccines you need and how often you need them. This information is not intended to replace advice given to you by your health care provider. Make sure you discuss any questions you have with your health care provider. Document Released: 11/12/2015 Document Revised: 07/05/2016 Document Reviewed: 08/17/2015 Elsevier Interactive Patient Education  2017 [place] Prevention in the Home Falls can cause injuries. They can happen to people of all ages. There are many things you can do to make your home safe and to help prevent falls. What can I do on the outside of my home?  Regularly fix the edges of walkways and driveways and fix any cracks.  Remove anything that might make you trip as you walk through a door, such as a raised step or threshold.  Trim any bushes or trees on the path to your home.  Use bright outdoor lighting.  Clear any walking paths of anything that might make someone trip, such as rocks or tools.  Regularly check to see if handrails are loose or broken. Make sure that both sides of any steps have handrails.  Any raised decks and porches should have guardrails on the edges.  Have any leaves, snow, or ice cleared regularly.  Use sand or salt on walking paths during winter.  Clean up any spills in your garage right away. This includes oil or grease spills. What can I do in the bathroom?  Use night lights.  Install grab bars by the toilet and in the tub and shower. Do not use towel bars as grab bars.  Use non-skid mats or decals in the tub or shower.  If you need to sit down in the shower, use a plastic, non-slip stool.  Keep the floor  dry. Clean up any water that spills on the floor as soon as it happens.  Remove soap buildup in the tub or shower regularly.  Attach bath mats securely with double-sided non-slip rug tape.  Do not have throw rugs and other things on the floor that can make you trip. What can I do in the bedroom?  Use night lights.  Make sure that you have a light by your bed that is easy to reach.  Do not use any sheets or blankets that are too big for your bed. They should not hang down onto the floor.  Have a firm chair that has side arms. You can use this for support while you get dressed.  Do not have throw rugs and other things on the floor that can make you trip. What can I do in the kitchen?  Clean up any spills right away.  Avoid walking on wet floors.  Keep items that you use a lot in easy-to-reach places.  If you need to reach something above you, use a strong step stool that has a grab bar.  Keep electrical cords out of the way.  Do not use floor polish or wax that  makes floors slippery. If you must use wax, use non-skid floor wax.  Do not have throw rugs and other things on the floor that can make you trip. What can I do with my stairs?  Do not leave any items on the stairs.  Make sure that there are handrails on both sides of the stairs and use them. Fix handrails that are broken or loose. Make sure that handrails are as long as the stairways.  Check any carpeting to make sure that it is firmly attached to the stairs. Fix any carpet that is loose or worn.  Avoid having throw rugs at the top or bottom of the stairs. If you do have throw rugs, attach them to the floor with carpet tape.  Make sure that you have a light switch at the top of the stairs and the bottom of the stairs. If you do not have them, ask someone to add them for you. What else can I do to help prevent falls?  Wear shoes that:  Do not have high heels.  Have rubber bottoms.  Are comfortable and fit you  well.  Are closed at the toe. Do not wear sandals.  If you use a stepladder:  Make sure that it is fully opened. Do not climb a closed stepladder.  Make sure that both sides of the stepladder are locked into place.  Ask someone to hold it for you, if possible.  Clearly mark and make sure that you can see:  Any grab bars or handrails.  First and last steps.  Where the edge of each step is.  Use tools that help you move around (mobility aids) if they are needed. These include:  Canes.  Walkers.  Scooters.  Crutches.  Turn on the lights when you go into a dark area. Replace any light bulbs as soon as they burn out.  Set up your furniture so you have a clear path. Avoid moving your furniture around.  If any of your floors are uneven, fix them.  If there are any pets around you, be aware of where they are.  Review your medicines with your doctor. Some medicines can make you feel dizzy. This can increase your chance of falling. Ask your doctor what other things that you can do to help prevent falls. This information is not intended to replace advice given to you by your health care provider. Make sure you discuss any questions you have with your health care provider. Document Released: 08/12/2009 Document Revised: 03/23/2016 Document Reviewed: 11/20/2014 Elsevier Interactive Patient Education  2017 [employer].

## 2017-11-28 NOTE — Patient Instructions (Addendum)
Pneumococcal Polysaccharide Vaccine: What You Need to Know 1. Why get vaccinated? Vaccination can protect older adults (and some children and younger adults) from pneumococcal disease. Pneumococcal disease is caused by bacteria that can spread from person to person through close contact. It can cause ear infections, and it can also lead to more serious infections of the:  Lungs (pneumonia),  Blood (bacteremia), and  Covering of the brain and spinal cord (meningitis). Meningitis can cause deafness and brain damage, and it can be fatal.  Anyone can get pneumococcal disease, but children under 52 years of age, people with certain medical conditions, adults over 104 years of age, and cigarette smokers are at the highest risk. About 18,000 older adults die each year from pneumococcal disease in the [place]. Treatment of pneumococcal infections with penicillin and other drugs used to be more effective. But some strains of the disease have become resistant to these drugs. This makes prevention of the disease, through vaccination, even more important. 2. Pneumococcal polysaccharide vaccine (PPSV23) Pneumococcal polysaccharide vaccine (PPSV23) protects against 23 types of pneumococcal bacteria. It will not prevent all pneumococcal disease. PPSV23 is recommended for:  All adults 31 years of age and older,  Anyone 2 through 66 years of age with certain long-term health problems,  Anyone 2 through 66 years of age with a weakened immune system,  Adults 35 through 66 years of age who smoke cigarettes or have asthma.  Most people need only one dose of PPSV. A second dose is recommended for certain high-risk groups. People 73 and older should get a dose even if they have gotten one or more doses of the vaccine before they turned 65. Your healthcare provider can give you more information about these recommendations. Most healthy adults develop protection within 2 to 3 weeks of getting the shot. 3.  Some people should not get this vaccine  Anyone who has had a life-threatening allergic reaction to PPSV should not get another dose.  Anyone who has a severe allergy to any component of PPSV should not receive it. Tell your provider if you have any severe allergies.  Anyone who is moderately or severely ill when the shot is scheduled may be asked to wait until they recover before getting the vaccine. Someone with a mild illness can usually be vaccinated.  Children less than 64 years of age should not receive this vaccine.  There is no evidence that PPSV is harmful to either a pregnant woman or to her fetus. However, as a precaution, women who need the vaccine should be vaccinated before becoming pregnant, if possible. 4. Risks of a vaccine reaction With any medicine, including vaccines, there is a chance of side effects. These are usually mild and go away on their own, but serious reactions are also possible. About half of people who get PPSV have mild side effects, such as redness or pain where the shot is given, which go away within about two days. Less than 1 out of 100 people develop a fever, muscle aches, or more severe local reactions. Problems that could happen after any vaccine:  People sometimes faint after a medical procedure, including vaccination. Sitting or lying down for about 15 minutes can help prevent fainting, and injuries caused by a fall. Tell your doctor if you feel dizzy, or have vision changes or ringing in the ears.  Some people get severe pain in the shoulder and have difficulty moving the arm where a shot was given. This happens very rarely.  Any  medication can cause a severe allergic reaction. Such reactions from a vaccine are very rare, estimated at about 1 in a million doses, and would happen within a few minutes to a few hours after the vaccination. As with any medicine, there is a very remote chance of a vaccine causing a serious injury or death. The safety of  vaccines is always being monitored. For more information, visit: http://www.aguilar.org/ 5. What if there is a serious reaction? What should I look for? Look for anything that concerns you, such as signs of a severe allergic reaction, very high fever, or unusual behavior. Signs of a severe allergic reaction can include hives, swelling of the face and throat, difficulty breathing, a fast heartbeat, dizziness, and weakness. These would usually start a few minutes to a few hours after the vaccination. What should I do? If you think it is a severe allergic reaction or other emergency that can't wait, call 9-1-1 or get to the nearest hospital. Otherwise, call your doctor. Afterward, the reaction should be reported to the Vaccine Adverse Event Reporting System (VAERS). Your doctor might file this report, or you can do it yourself through the VAERS web site at www.vaers.SamedayNews.es, or by calling (737)871-4973. VAERS does not give medical advice. 6. How can I learn more?  Ask your doctor. He or she can give you the vaccine package insert or suggest other sources of information.  Call your local or state health department.  Contact the Centers for Disease Control and Prevention (CDC): ? Call 320-331-0401 (1-800-CDC-INFO) or ? Visit CDC's website at http://hunter.com/ CDC Pneumococcal Polysaccharide Vaccine VIS (02/20/14) This information is not intended to replace advice given to you by your health care provider. Make sure you discuss any questions you have with your health care provider. Document Released: 08/13/2006 Document Revised: 07/06/2016 Document Reviewed: 07/06/2016 Elsevier Interactive Patient Education  2017 [place] Maintenance, Female Adopting a healthy lifestyle and getting preventive care can go a long way to promote health and wellness. Talk with your health care provider about what schedule of regular examinations is right for you. This is a good chance for you to  check in with your provider about disease prevention and staying healthy. In between checkups, there are plenty of things you can do on your own. Experts have done a lot of research about which lifestyle changes and preventive measures are most likely to keep you healthy. Ask your health care provider for more information. Weight and diet Eat a healthy diet  Be sure to include plenty of vegetables, fruits, low-fat dairy products, and lean protein.  Do not eat a lot of foods high in solid fats, added sugars, or salt.  Get regular exercise. This is one of the most important things you can do for your health. ? Most adults should exercise for at least 150 minutes each week. The exercise should increase your heart rate and make you sweat (moderate-intensity exercise). ? Most adults should also do strengthening exercises at least twice a week. This is in addition to the moderate-intensity exercise.  Maintain a healthy weight  Body mass index (BMI) is a measurement that can be used to identify possible weight problems. It estimates body fat based on height and weight. Your health care provider can help determine your BMI and help you achieve or maintain a healthy weight.  For females 60 years of age and older: ? A BMI below 18.5 is considered underweight. ? A BMI of 18.5 to 24.9 is normal. ? A  BMI of 25 to 29.9 is considered overweight. ? A BMI of 30 and above is considered obese.  Watch levels of cholesterol and blood lipids  You should start having your blood tested for lipids and cholesterol at 66 years of age, then have this test every 5 years.  You may need to have your cholesterol levels checked more often if: ? Your lipid or cholesterol levels are high. ? You are older than 66 years of age. ? You are at high risk for heart disease.  Cancer screening Lung Cancer  Lung cancer screening is recommended for adults 89-88 years old who are at high risk for lung cancer because of a  history of smoking.  A yearly low-dose CT scan of the lungs is recommended for people who: ? Currently smoke. ? Have quit within the past 15 years. ? Have at least a 30-pack-year history of smoking. A pack year is smoking an average of one pack of cigarettes a day for 1 year.  Yearly screening should continue until it has been 15 years since you quit.  Yearly screening should stop if you develop a health problem that would prevent you from having lung cancer treatment.  Breast Cancer  Practice breast self-awareness. This means understanding how your breasts normally appear and feel.  It also means doing regular breast self-exams. Let your health care provider know about any changes, no matter how small.  If you are in your 20s or 30s, you should have a clinical breast exam (CBE) by a health care provider every 1-3 years as part of a regular health exam.  If you are 98 or older, have a CBE every year. Also consider having a breast X-ray (mammogram) every year.  If you have a family history of breast cancer, talk to your health care provider about genetic screening.  If you are at high risk for breast cancer, talk to your health care provider about having an MRI and a mammogram every year.  Breast cancer gene (BRCA) assessment is recommended for women who have family members with BRCA-related cancers. BRCA-related cancers include: ? Breast. ? Ovarian. ? Tubal. ? Peritoneal cancers.  Results of the assessment will determine the need for genetic counseling and BRCA1 and BRCA2 testing.  Cervical Cancer Your health care provider may recommend that you be screened regularly for cancer of the pelvic organs (ovaries, uterus, and vagina). This screening involves a pelvic examination, including checking for microscopic changes to the surface of your cervix (Pap test). You may be encouraged to have this screening done every 3 years, beginning at age 49.  For women ages 40-65, health care  providers may recommend pelvic exams and Pap testing every 3 years, or they may recommend the Pap and pelvic exam, combined with testing for human papilloma virus (HPV), every 5 years. Some types of HPV increase your risk of cervical cancer. Testing for HPV may also be done on women of any age with unclear Pap test results.  Other health care providers may not recommend any screening for nonpregnant women who are considered low risk for pelvic cancer and who do not have symptoms. Ask your health care provider if a screening pelvic exam is right for you.  If you have had past treatment for cervical cancer or a condition that could lead to cancer, you need Pap tests and screening for cancer for at least 20 years after your treatment. If Pap tests have been discontinued, your risk factors (such as having a new  sexual partner) need to be reassessed to determine if screening should resume. Some women have medical problems that increase the chance of getting cervical cancer. In these cases, your health care provider may recommend more frequent screening and Pap tests.  Colorectal Cancer  This type of cancer can be detected and often prevented.  Routine colorectal cancer screening usually begins at 66 years of age and continues through 66 years of age.  Your health care provider may recommend screening at an earlier age if you have risk factors for colon cancer.  Your health care provider may also recommend using home test kits to check for hidden blood in the stool.  A small camera at the end of a tube can be used to examine your colon directly (sigmoidoscopy or colonoscopy). This is done to check for the earliest forms of colorectal cancer.  Routine screening usually begins at age 81.  Direct examination of the colon should be repeated every 5-10 years through 66 years of age. However, you may need to be screened more often if early forms of precancerous polyps or small growths are found.  Skin  Cancer  Check your skin from head to toe regularly.  Tell your health care provider about any new moles or changes in moles, especially if there is a change in a mole's shape or color.  Also tell your health care provider if you have a mole that is larger than the size of a pencil eraser.  Always use sunscreen. Apply sunscreen liberally and repeatedly throughout the day.  Protect yourself by wearing long sleeves, pants, a wide-brimmed hat, and sunglasses whenever you are outside.  Heart disease, diabetes, and high blood pressure  High blood pressure causes heart disease and increases the risk of stroke. High blood pressure is more likely to develop in: ? People who have blood pressure in the high end of the normal range (130-139/85-89 mm Hg). ? People who are overweight or obese. ? People who are African American.  If you are 25-13 years of age, have your blood pressure checked every 3-5 years. If you are 62 years of age or older, have your blood pressure checked every year. You should have your blood pressure measured twice-once when you are at a hospital or clinic, and once when you are not at a hospital or clinic. Record the average of the two measurements. To check your blood pressure when you are not at a hospital or clinic, you can use: ? An automated blood pressure machine at a pharmacy. ? A home blood pressure monitor.  If you are between 31 years and 44 years old, ask your health care provider if you should take aspirin to prevent strokes.  Have regular diabetes screenings. This involves taking a blood sample to check your fasting blood sugar level. ? If you are at a normal weight and have a low risk for diabetes, have this test once every three years after 66 years of age. ? If you are overweight and have a high risk for diabetes, consider being tested at a younger age or more often. Preventing infection Hepatitis B  If you have a higher risk for hepatitis B, you should be  screened for this virus. You are considered at high risk for hepatitis B if: ? You were born in a country where hepatitis B is common. Ask your health care provider which countries are considered high risk. ? Your parents were born in a high-risk country, and you have not been immunized  against hepatitis B (hepatitis B vaccine). ? You have HIV or AIDS. ? You use needles to inject street drugs. ? You live with someone who has hepatitis B. ? You have had sex with someone who has hepatitis B. ? You get hemodialysis treatment. ? You take certain medicines for conditions, including cancer, organ transplantation, and autoimmune conditions.  Hepatitis C  Blood testing is recommended for: ? Everyone born from [birth year] through 1964. ? Anyone with known risk factors for hepatitis C.  Sexually transmitted infections (STIs)  You should be screened for sexually transmitted infections (STIs) including gonorrhea and chlamydia if: ? You are sexually active and are younger than 66 years of age. ? You are older than 66 years of age and your health care provider tells you that you are at risk for this type of infection. ? Your sexual activity has changed since you were last screened and you are at an increased risk for chlamydia or gonorrhea. Ask your health care provider if you are at risk.  If you do not have HIV, but are at risk, it may be recommended that you take a prescription medicine daily to prevent HIV infection. This is called pre-exposure prophylaxis (PrEP). You are considered at risk if: ? You are sexually active and do not regularly use condoms or know the HIV status of your partner(s). ? You take drugs by injection. ? You are sexually active with a partner who has HIV.  Talk with your health care provider about whether you are at high risk of being infected with HIV. If you choose to begin PrEP, you should first be tested for HIV. You should then be tested every 3 months for as long as you are  taking PrEP. Pregnancy  If you are premenopausal and you may become pregnant, ask your health care provider about preconception counseling.  If you may become pregnant, take 400 to 800 micrograms (mcg) of folic acid every day.  If you want to prevent pregnancy, talk to your health care provider about birth control (contraception). Osteoporosis and menopause  Osteoporosis is a disease in which the bones lose minerals and strength with aging. This can result in serious bone fractures. Your risk for osteoporosis can be identified using a bone density scan.  If you are 23 years of age or older, or if you are at risk for osteoporosis and fractures, ask your health care provider if you should be screened.  Ask your health care provider whether you should take a calcium or vitamin D supplement to lower your risk for osteoporosis.  Menopause may have certain physical symptoms and risks.  Hormone replacement therapy may reduce some of these symptoms and risks. Talk to your health care provider about whether hormone replacement therapy is right for you. Follow these instructions at home:  Schedule regular health, dental, and eye exams.  Stay current with your immunizations.  Do not use any tobacco products including cigarettes, chewing tobacco, or electronic cigarettes.  If you are pregnant, do not drink alcohol.  If you are breastfeeding, limit how much and how often you drink alcohol.  Limit alcohol intake to no more than 1 drink per day for nonpregnant women. One drink equals 12 ounces of beer, 5 ounces of wine, or 1 ounces of hard liquor.  Do not use street drugs.  Do not share needles.  Ask your health care provider for help if you need support or information about quitting drugs.  Tell your health care provider if you  often feel depressed.  Tell your health care provider if you have ever been abused or do not feel safe at home. This information is not intended to replace  advice given to you by your health care provider. Make sure you discuss any questions you have with your health care provider. Document Released: 05/01/2011 Document Revised: 03/23/2016 Document Reviewed: 07/20/2015 Elsevier Interactive Patient Education  2018 [place] Maintenance for Postmenopausal Women Menopause is a normal process in which your reproductive ability comes to an end. This process happens gradually over a span of months to years, usually between the ages of 33 and 65. Menopause is complete when you have missed 12 consecutive menstrual periods. It is important to talk with your health care provider about some of the most common conditions that affect postmenopausal women, such as heart disease, cancer, and bone loss (osteoporosis). Adopting a healthy lifestyle and getting preventive care can help to promote your health and wellness. Those actions can also lower your chances of developing some of these common conditions. What should I know about menopause? During menopause, you may experience a number of symptoms, such as:  Moderate-to-severe hot flashes.  Night sweats.  Decrease in sex drive.  Mood swings.  Headaches.  Tiredness.  Irritability.  Memory problems.  Insomnia.  Choosing to treat or not to treat menopausal changes is an individual decision that you make with your health care provider. What should I know about hormone replacement therapy and supplements? Hormone therapy products are effective for treating symptoms that are associated with menopause, such as hot flashes and night sweats. Hormone replacement carries certain risks, especially as you become older. If you are thinking about using estrogen or estrogen with progestin treatments, discuss the benefits and risks with your health care provider. What should I know about heart disease and stroke? Heart disease, heart attack, and stroke become more likely as you age. This may be due, in  part, to the hormonal changes that your body experiences during menopause. These can affect how your body processes dietary fats, triglycerides, and cholesterol. Heart attack and stroke are both medical emergencies. There are many things that you can do to help prevent heart disease and stroke:  Have your blood pressure checked at least every 1-2 years. High blood pressure causes heart disease and increases the risk of stroke.  If you are 3-13 years old, ask your health care provider if you should take aspirin to prevent a heart attack or a stroke.  Do not use any tobacco products, including cigarettes, chewing tobacco, or electronic cigarettes. If you need help quitting, ask your health care provider.  It is important to eat a healthy diet and maintain a healthy weight. ? Be sure to include plenty of vegetables, fruits, low-fat dairy products, and lean protein. ? Avoid eating foods that are high in solid fats, added sugars, or salt (sodium).  Get regular exercise. This is one of the most important things that you can do for your health. ? Try to exercise for at least 150 minutes each week. The type of exercise that you do should increase your heart rate and make you sweat. This is known as moderate-intensity exercise. ? Try to do strengthening exercises at least twice each week. Do these in addition to the moderate-intensity exercise.  Know your numbers.Ask your health care provider to check your cholesterol and your blood glucose. Continue to have your blood tested as directed by your health care provider.  What should I know  about cancer screening? There are several types of cancer. Take the following steps to reduce your risk and to catch any cancer development as early as possible. Breast Cancer  Practice breast self-awareness. ? This means understanding how your breasts normally appear and feel. ? It also means doing regular breast self-exams. Let your health care provider know about  any changes, no matter how small.  If you are 33 or older, have a clinician do a breast exam (clinical breast exam or CBE) every year. Depending on your age, family history, and medical history, it may be recommended that you also have a yearly breast X-ray (mammogram).  If you have a family history of breast cancer, talk with your health care provider about genetic screening.  If you are at high risk for breast cancer, talk with your health care provider about having an MRI and a mammogram every year.  Breast cancer (BRCA) gene test is recommended for women who have family members with BRCA-related cancers. Results of the assessment will determine the need for genetic counseling and BRCA1 and for BRCA2 testing. BRCA-related cancers include these types: ? Breast. This occurs in males or females. ? Ovarian. ? Tubal. This may also be called fallopian tube cancer. ? Cancer of the abdominal or pelvic lining (peritoneal cancer). ? Prostate. ? Pancreatic.  Cervical, Uterine, and Ovarian Cancer Your health care provider may recommend that you be screened regularly for cancer of the pelvic organs. These include your ovaries, uterus, and vagina. This screening involves a pelvic exam, which includes checking for microscopic changes to the surface of your cervix (Pap test).  For women ages 21-65, health care providers may recommend a pelvic exam and a Pap test every three years. For women ages 41-65, they may recommend the Pap test and pelvic exam, combined with testing for human papilloma virus (HPV), every five years. Some types of HPV increase your risk of cervical cancer. Testing for HPV may also be done on women of any age who have unclear Pap test results.  Other health care providers may not recommend any screening for nonpregnant women who are considered low risk for pelvic cancer and have no symptoms. Ask your health care provider if a screening pelvic exam is right for you.  If you have had  past treatment for cervical cancer or a condition that could lead to cancer, you need Pap tests and screening for cancer for at least 20 years after your treatment. If Pap tests have been discontinued for you, your risk factors (such as having a new sexual partner) need to be reassessed to determine if you should start having screenings again. Some women have medical problems that increase the chance of getting cervical cancer. In these cases, your health care provider may recommend that you have screening and Pap tests more often.  If you have a family history of uterine cancer or ovarian cancer, talk with your health care provider about genetic screening.  If you have vaginal bleeding after reaching menopause, tell your health care provider.  There are currently no reliable tests available to screen for ovarian cancer.  Lung Cancer Lung cancer screening is recommended for adults 68-73 years old who are at high risk for lung cancer because of a history of smoking. A yearly low-dose CT scan of the lungs is recommended if you:  Currently smoke.  Have a history of at least 30 pack-years of smoking and you currently smoke or have quit within the past 15 years. A pack-year  is smoking an average of one pack of cigarettes per day for one year.  Yearly screening should:  Continue until it has been 15 years since you quit.  Stop if you develop a health problem that would prevent you from having lung cancer treatment.  Colorectal Cancer  This type of cancer can be detected and can often be prevented.  Routine colorectal cancer screening usually begins at age 18 and continues through age 39.  If you have risk factors for colon cancer, your health care provider may recommend that you be screened at an earlier age.  If you have a family history of colorectal cancer, talk with your health care provider about genetic screening.  Your health care provider may also recommend using home test kits to  check for hidden blood in your stool.  A small camera at the end of a tube can be used to examine your colon directly (sigmoidoscopy or colonoscopy). This is done to check for the earliest forms of colorectal cancer.  Direct examination of the colon should be repeated every 5-10 years until age 59. However, if early forms of precancerous polyps or small growths are found or if you have a family history or genetic risk for colorectal cancer, you may need to be screened more often.  Skin Cancer  Check your skin from head to toe regularly.  Monitor any moles. Be sure to tell your health care provider: ? About any new moles or changes in moles, especially if there is a change in a mole's shape or color. ? If you have a mole that is larger than the size of a pencil eraser.  If any of your family members has a history of skin cancer, especially at a young age, talk with your health care provider about genetic screening.  Always use sunscreen. Apply sunscreen liberally and repeatedly throughout the day.  Whenever you are outside, protect yourself by wearing long sleeves, pants, a wide-brimmed hat, and sunglasses.  What should I know about osteoporosis? Osteoporosis is a condition in which bone destruction happens more quickly than new bone creation. After menopause, you may be at an increased risk for osteoporosis. To help prevent osteoporosis or the bone fractures that can happen because of osteoporosis, the following is recommended:  If you are 35-39 years old, get at least 1,000 mg of calcium and at least 600 mg of vitamin D per day.  If you are older than age 68 but younger than age 71, get at least 1,200 mg of calcium and at least 600 mg of vitamin D per day.  If you are older than age 29, get at least 1,200 mg of calcium and at least 800 mg of vitamin D per day.  Smoking and excessive alcohol intake increase the risk of osteoporosis. Eat foods that are rich in calcium and vitamin D, and  do weight-bearing exercises several times each week as directed by your health care provider. What should I know about how menopause affects my mental health? Depression may occur at any age, but it is more common as you become older. Common symptoms of depression include:  Low or sad mood.  Changes in sleep patterns.  Changes in appetite or eating patterns.  Feeling an overall lack of motivation or enjoyment of activities that you previously enjoyed.  Frequent crying spells.  Talk with your health care provider if you think that you are experiencing depression. What should I know about immunizations? It is important that you get and  maintain your immunizations. These include:  Tetanus, diphtheria, and pertussis (Tdap) booster vaccine.  Influenza every year before the flu season begins.  Pneumonia vaccine.  Shingles vaccine.  Your health care provider may also recommend other immunizations. This information is not intended to replace advice given to you by your health care provider. Make sure you discuss any questions you have with your health care provider. Document Released: 12/08/2005 Document Revised: 05/05/2016 Document Reviewed: 07/20/2015 Elsevier Interactive Patient Education  2018 [employer].  Hip Exercises Ask your health care provider which exercises are safe for you. Do exercises exactly as told by your health care provider and adjust them as directed. It is normal to feel mild stretching, pulling, tightness, or discomfort as you do these exercises, but you should stop right away if you feel sudden pain or your pain gets worse.Do not begin these exercises until told by your health care provider. STRETCHING AND RANGE OF MOTION EXERCISES These exercises warm up your muscles and joints and improve the movement and flexibility of your hip. These exercises also help to relieve pain, numbness, and tingling. Exercise A: Hamstrings, Supine  1. Lie on your back. 2. Loop a  belt or towel over the ball of your left / rightfoot. The ball of your foot is on the walking surface, right under your toes. 3. Straighten your left / rightknee and slowly pull on the belt to raise your leg. ? Do not let your left / right knee bend while you do this. ? Keep your other leg flat on the floor. ? Raise the left / right leg until you feel a gentle stretch behind your left / right knee or thigh. 4. Hold this position for __________ seconds. 5. Slowly return your leg to the starting position. Repeat __________ times. Complete this stretch __________ times a day. Exercise B: Hip Rotators  1. Lie on your back on a firm surface. 2. Hold your left / right knee with your left / right hand. Hold your ankle with your other hand. 3. Gently pull your left / right knee and rotate your lower leg toward your other shoulder. ? Pull until you feel a stretch in your buttocks. ? Keep your hips and shoulders firmly planted while you do this stretch. 4. Hold this position for __________ seconds. Repeat __________ times. Complete this stretch __________ times a day. Exercise C: V-Sit (Hamstrings and Adductors)  1. Sit on the floor with your legs extended in a large "V" shape. Keep your knees straight during this exercise. 2. Start with your head and chest upright, then bend at your waist to reach for your left foot (position A). You should feel a stretch in your right inner thigh. 3. Hold this position for __________ seconds. Then slowly return to the upright position. 4. Bend at your waist to reach forward (position B). You should feel a stretch behind both of your thighs and knees. 5. Hold this position for __________ seconds. Then slowly return to the upright position. 6. Bend at your waist to reach for your right foot (position C). You should feel a stretch in your left inner thigh. 7. Hold this position for __________ seconds. Then slowly return to the upright position. Repeat __________  times. Complete this stretch __________ times a day. Exercise D: Lunge (Hip Flexors)  1. Place your left / right knee on the floor and bend your other knee so that is directly over your ankle. You should be half-kneeling. 2. Keep good posture with your  head over your shoulders. 3. Tighten your buttocks to point your tailbone downward. This helps your back to keep from arching too much. 4. You should feel a gentle stretch in the front of your left / right thigh and hip. If you do not feel any resistance, slightly slide your other foot forward and then slowly lunge forward so your knee once again lines up over your ankle. 5. Make sure your tailbone continues to point downward. 6. Hold this position for __________ seconds. Repeat __________ times. Complete this stretch __________ times a day. STRENGTHENING EXERCISES These exercises build strength and endurance in your hip. Endurance is the ability to use your muscles for a long time, even after they get tired. Exercise E: Bridge (Hip Extensors)  1. Lie on your back on a firm surface with your knees bent and your feet flat on the floor. 2. Tighten your buttocks muscles and lift your bottom off the floor until the trunk of your body is level with your thighs. ? Do not arch your back. ? You should feel the muscles working in your buttocks and the back of your thighs. If you do not feel these muscles, slide your feet 1-2 inches (2.5-5 cm) farther away from your buttocks. 3. Hold this position for __________ seconds. 4. Slowly lower your hips to the starting position. 5. Let your muscles relax completely between repetitions. 6. If this exercise is too easy, try doing it with your arms crossed over your chest. Repeat __________ times. Complete this exercise __________ times a day. Exercise F: Straight Leg Raises - Hip Abductors  1. Lie on your side with your left / right leg in the top position. Lie so your head, shoulder, knee, and hip line up with  each other. You may bend your bottom knee to help you balance. 2. Roll your hips slightly forward, so your hips are stacked directly over each other and your left / right knee is facing forward. 3. Leading with your heel, lift your top leg 4-6 inches (10-15 cm). You should feel the muscles in your outer hip lifting. ? Do not let your foot drift forward. ? Do not let your knee roll toward the ceiling. 4. Hold this position for __________ seconds. 5. Slowly return to the starting position. 6. Let your muscles relax completely between repetitions. Repeat __________ times. Complete this exercise __________ times a day. Exercise G: Straight Leg Raises - Hip Adductors  1. Lie on your side with your left / right leg in the bottom position. Lie so your head, shoulder, knee, and hip line up. You may place your upper foot in front to help you balance. 2. Roll your hips slightly forward, so your hips are stacked directly over each other and your left / right knee is facing forward. 3. Tense the muscles in your inner thigh and lift your bottom leg 4-6 inches (10-15 cm). 4. Hold this position for __________ seconds. 5. Slowly return to the starting position. 6. Let your muscles relax completely between repetitions. Repeat __________ times. Complete this exercise __________ times a day. Exercise H: Straight Leg Raises - Quadriceps  1. Lie on your back with your left / right leg extended and your other knee bent. 2. Tense the muscles in the front of your left / right thigh. When you do this, you should see your kneecap slide up or see increased dimpling just above your knee. 3. Tighten these muscles even more and raise your leg 4-6 inches (10-15 cm) off  the floor. 4. Hold this position for __________ seconds. 5. Keep these muscles tense as you lower your leg. 6. Relax the muscles slowly and completely between repetitions. Repeat __________ times. Complete this exercise __________ times a day. Exercise I:  Hip Abductors, Standing 1. Tie one end of a rubber exercise band or tubing to a secure surface, such as a table or pole. 2. Loop the other end of the band or tubing around your left / right ankle. 3. Keeping your ankle with the band or tubing directly opposite of the secured end, step away until there is tension in the tubing or band. Hold onto a chair as needed for balance. 4. Lift your left / right leg out to your side. While you do this: ? Keep your back upright. ? Keep your shoulders over your hips. ? Keep your toes pointing forward. ? Make sure to use your hip muscles to lift your leg. Do not "throw" your leg or tip your body to lift your leg. 5. Hold this position for __________ seconds. 6. Slowly return to the starting position. Repeat __________ times. Complete this exercise __________ times a day. Exercise J: Squats (Quadriceps) 1. Stand in a door frame so your feet and knees are in line with the frame. You may place your hands on the frame for balance. 2. Slowly bend your knees and lower your hips like you are going to sit in a chair. ? Keep your lower legs in a straight-up-and-down position. ? Do not let your hips go lower than your knees. ? Do not bend your knees lower than told by your health care provider. ? If your hip pain increases, do not bend as low. 3. Hold this position for ___________ seconds. 4. Slowly push with your legs to return to standing. Do not use your hands to pull yourself to standing. Repeat __________ times. Complete this exercise __________ times a day. This information is not intended to replace advice given to you by your health care provider. Make sure you discuss any questions you have with your health care provider. Document Released: 11/03/2005 Document Revised: 07/10/2016 Document Reviewed: 10/11/2015 Elsevier Interactive Patient Education  [employer].

## 2017-11-28 NOTE — Assessment & Plan Note (Signed)
Exacerbated with her fosamax. Will try to get her on reclast- referral generated. Continue current regimen. Call with any concerns.

## 2017-11-28 NOTE — Progress Notes (Signed)
BP 126/84 (BP Location: Left Arm, Patient Position: Sitting, Cuff Size: Normal)   Pulse 71   Temp 98.5 F (36.9 C)   Ht 5' 9.3" (1.76 m)   Wt 211 lb 7 oz (95.9 kg)   SpO2 100%   BMI 30.95 kg/m    Subjective:    Patient ID: Mckenzie Webster, female    DOB: October 16, 1951, 67 y.o.   MRN: [id number]  HPI: Mckenzie Webster is a 67 y.o. female presenting on 11/28/2017 for comprehensive medical examination. Current medical complaints include:  ANEMIA Anemia status: controlled Etiology of anemia: iron deficiency Duration of anemia treatment: chronic Compliance with treatment: excellent compliance Iron supplementation side effects: no Severity of anemia: moderate Fatigue: no Decreased exercise tolerance: no  Dyspnea on exertion: no Palpitations: no Bleeding: no Pica: no  BINGE EATING- stable. Medicine continues to work. Not losing weight, but has been controlling her appetite.   HIP PAIN Duration: chronic- significantly worse in the last 6 months Involved hip: left  Mechanism of injury: unknown Location: deep Onset: gradual  Severity: moderate to severe Quality: dull and aching Frequency: constant, worse with activity and changing positions Radiation: no Aggravating factors: walking, working in the yard   Alleviating factors: NSAIDs and rest  Status: worse Treatments attempted: rest and aleve   Relief with NSAIDs?: moderate Weakness with weight bearing: no Weakness with walking: no Paresthesias / decreased sensation: no Swelling: no Redness:no Fevers: no  Osteoporosis- getting bad GERD on her fosamax. Not feeling great on it.   GERD GERD control status: exacerbated  Satisfied with current treatment? yes Heartburn frequency: with fosamax Medication side effects: yes  Medication compliance: excellent Dysphagia: no Odynophagia:  no Hematemesis: no Blood in stool: no EGD: no  She currently lives with: Alone Menopausal Symptoms: no  Depression Screen done today and  results listed below:  Depression screen [facility] 2/9 11/28/2017 11/28/2017 11/24/2016 10/13/2016 01/04/2016  Decreased Interest 0 0 0 0 2  Down, Depressed, Hopeless 0 0 0 0 2  PHQ - 2 Score 0 0 0 0 4  Altered sleeping 1 1 - - 2  Tired, decreased energy 0 - - - 3  Change in appetite 1 1 - - 3  Feeling bad or failure about yourself  1 1 - - 3  Trouble concentrating 0 0 - - 1  Moving slowly or fidgety/restless 0 0 - - 0  Suicidal thoughts 0 0 - - 0  PHQ-9 Score 3 3 - - 16  Difficult doing work/chores - - - - Somewhat difficult     Past Medical History:  Past Medical History:  Diagnosis Date  . Anemia   . Cataract   . Dry eye syndrome   . Stomach ulcer     Surgical History:  Past Surgical History:  Procedure Laterality Date  . COLONOSCOPY WITH PROPOFOL N/A 04/27/2017   Procedure: COLONOSCOPY WITH PROPOFOL;  Surgeon: Jonathon Bellows, MD;  Location: [facility] ENDOSCOPY;  Service: Endoscopy;  Laterality: N/A;  . EYE SURGERY     Cataract  . FOOT SURGERY     Change the alignment of the toes to stop the formation of corns  . gastric bypass  1981    Medications:  Current Outpatient Medications on File Prior to Visit  Medication Sig  . alendronate (FOSAMAX) 70 MG tablet Take 1 tablet (70 mg total) by mouth once a week. Take with a full glass of water on an empty stomach.  . naproxen sodium (ANAPROX) 220 MG tablet  Take 440 mg by mouth daily.   No current facility-administered medications on file prior to visit.     Allergies:  No Known Allergies  Social History:  Social History   Socioeconomic History  . Marital status: Divorced    Spouse name: Not on file  . Number of children: Not on file  . Years of education: Not on file  . Highest education level: Not on file  Social Needs  . Financial resource strain: Not hard at all  . Food insecurity - worry: Never true  . Food insecurity - inability: Never true  . Transportation needs - medical: No  . Transportation needs - non-medical: No    Occupational History  . Not on file  Tobacco Use  . Smoking status: Former Smoker    Last attempt to quit: 10/31/1995    Years since quitting: 22.0  . Smokeless tobacco: Former Network engineer and Sexual Activity  . Alcohol use: Yes    Comment: on occasion  . Drug use: No  . Sexual activity: Not Currently  Other Topics Concern  . Not on file  Social History Narrative  . Not on file   Social History   Tobacco Use  Smoking Status Former Smoker  . Last attempt to quit: 10/31/1995  . Years since quitting: 22.0  Smokeless Tobacco Former Systems developer   Social History   Substance and Sexual Activity  Alcohol Use Yes   Comment: on occasion    Family History:  Family History  Problem Relation Age of Onset  . Heart disease Mother   . COPD Mother   . Heart disease Father   . Hypertension Father   . Heart disease Maternal Grandfather   . Alzheimer's disease Paternal Grandmother   . Breast cancer Neg Hx     Past medical history, surgical history, medications, allergies, family history and social history reviewed with patient today and changes made to appropriate areas of the chart.   Review of Systems  Constitutional: Negative.   HENT: Positive for congestion and hearing loss. Negative for ear discharge, ear pain, nosebleeds, sinus pain, sore throat and tinnitus.   Eyes: Negative.   Respiratory: Negative.  Negative for stridor.   Cardiovascular: Negative.   Gastrointestinal: Negative.   Genitourinary: Negative.   Musculoskeletal: Positive for joint pain. Negative for back pain, falls, myalgias and neck pain.  Skin: Negative.   Neurological: Negative.   Endo/Heme/Allergies: Negative.   Psychiatric/Behavioral: Positive for depression. Negative for hallucinations, memory loss, substance abuse and suicidal ideas. The patient is not nervous/anxious and does not have insomnia.    All other ROS negative except what is listed above and in the HPI.      Objective:    BP 126/84 (BP  Location: Left Arm, Patient Position: Sitting, Cuff Size: Normal)   Pulse 71   Temp 98.5 F (36.9 C)   Ht 5' 9.3" (1.76 m)   Wt 211 lb 7 oz (95.9 kg)   SpO2 100%   BMI 30.95 kg/m   Wt Readings from Last 3 Encounters:  11/28/17 211 lb 7 oz (95.9 kg)  11/28/17 211 lb 7 oz (95.9 kg)  08/17/17 207 lb 4 oz (94 kg)    Physical Exam  Constitutional: She is oriented to person, place, and time. She appears well-developed and well-nourished. No distress.  HENT:  Head: Normocephalic and atraumatic.  Right Ear: Hearing, tympanic membrane, external ear and ear canal normal.  Left Ear: Hearing, tympanic membrane, external ear and ear canal  normal.  Nose: Nose normal.  Mouth/Throat: Uvula is midline, oropharynx is clear and moist and mucous membranes are normal. No oropharyngeal exudate.  Eyes: Conjunctivae, EOM and lids are normal. Pupils are equal, round, and reactive to light. Right eye exhibits no discharge. Left eye exhibits no discharge. No scleral icterus.  Neck: Normal range of motion. Neck supple. No JVD present. No tracheal deviation present. No thyromegaly present.  Cardiovascular: Normal rate, regular rhythm, normal heart sounds and intact distal pulses. Exam reveals no gallop and no friction rub.  No murmur heard. Pulmonary/Chest: Effort normal and breath sounds normal. No stridor. No respiratory distress. She has no wheezes. She has no rales. She exhibits no tenderness.  Abdominal: Soft. Bowel sounds are normal. She exhibits no distension and no mass. There is no tenderness. There is no rebound and no guarding.  Genitourinary:  Genitourinary Comments: Breast and pelvic exams deferred with shared decision making  Musculoskeletal: Normal range of motion. She exhibits tenderness. She exhibits no edema or deformity.  Lymphadenopathy:    She has no cervical adenopathy.  Neurological: She is alert and oriented to person, place, and time. She has normal reflexes. She displays normal  reflexes. No cranial nerve deficit. She exhibits normal muscle tone. Coordination normal.  Skin: Skin is warm, dry and intact. No rash noted. She is not diaphoretic. No erythema. No pallor.  Psychiatric: She has a normal mood and affect. Her speech is normal and behavior is normal. Judgment and thought content normal. Cognition and memory are normal.  Nursing note and vitals reviewed.   Results for orders placed or performed in visit on 05/16/17  CBC with Differential/Platelet  Result Value Ref Range   WBC 11.3 (H) 3.4 - 10.8 x10E3/uL   RBC 4.85 3.77 - 5.28 x10E6/uL   Hemoglobin 14.4 11.1 - 15.9 g/dL   Hematocrit 43.6 34.0 - 46.6 %   MCV 90 79 - 97 fL   MCH 29.7 26.6 - 33.0 pg   MCHC 33.0 31.5 - 35.7 g/dL   RDW 14.0 12.3 - 15.4 %   Platelets 287 150 - 379 x10E3/uL   Neutrophils 85 Not Estab. %   Lymphs 10 Not Estab. %   Monocytes 5 Not Estab. %   Eos 0 Not Estab. %   Basos 0 Not Estab. %   Neutrophils Absolute 9.6 (H) 1.4 - 7.0 x10E3/uL   Lymphocytes Absolute 1.1 0.7 - 3.1 x10E3/uL   Monocytes Absolute 0.5 0.1 - 0.9 x10E3/uL   EOS (ABSOLUTE) 0.0 0.0 - 0.4 x10E3/uL   Basophils Absolute 0.0 0.0 - 0.2 x10E3/uL   Immature Granulocytes 0 Not Estab. %   Immature Grans (Abs) 0.0 0.0 - 0.1 x10E3/uL  Comprehensive metabolic panel  Result Value Ref Range   Glucose 97 65 - 99 mg/dL   BUN 21 8 - 27 mg/dL   Creatinine, Ser 0.80 0.57 - 1.00 mg/dL   GFR calc non Af Amer 78 >59 mL/min/1.73   GFR calc Af Amer 89 >59 mL/min/1.73   BUN/Creatinine Ratio 26 12 - 28   Sodium 140 134 - 144 mmol/L   Potassium 4.5 3.5 - 5.2 mmol/L   Chloride 103 96 - 106 mmol/L   CO2 22 20 - 29 mmol/L   Calcium 9.3 8.7 - 10.3 mg/dL   Total Protein 6.7 6.0 - 8.5 g/dL   Albumin 4.3 3.6 - 4.8 g/dL   Globulin, Total 2.4 1.5 - 4.5 g/dL   Albumin/Globulin Ratio 1.8 1.2 - 2.2   Bilirubin Total 0.6  0.0 - 1.2 mg/dL   Alkaline Phosphatase 106 39 - 117 IU/L   AST 31 0 - 40 IU/L   ALT 43 (H) 0 - 32 IU/L  Thyroid Panel  With TSH  Result Value Ref Range   TSH 2.130 0.450 - 4.500 uIU/mL   T4, Total 8.8 4.5 - 12.0 ug/dL   T3 Uptake Ratio 26 24 - 39 %   Free Thyroxine Index 2.3 1.2 - 4.9  VITAMIN D 25 Hydroxy (Vit-D Deficiency, Fractures)  Result Value Ref Range   Vit D, 25-Hydroxy 26.2 (L) 30.0 - 100.0 ng/mL  Lyme Ab/Western Blot Reflex  Result Value Ref Range   Lyme IgG/IgM Ab <0.91 0.00 - 0.90 ISR   LYME DISEASE AB, QUANT, IGM <0.80 0.00 - 0.79 index  Rocky mtn spotted fvr abs pnl(IgG+IgM)  Result Value Ref Range   RMSF IgG Negative Negative   RMSF IgM 0.32 0.00 - 2.67 index  Ehrlichia Antibody Panel  Result Value Ref Range   E.Chaffeensis (HME) IgG Negative Neg:<1:64   E. Chaffeensis (HME) IgM Titer Negative Neg:<1:20   HGE IgG Titer Negative Neg:<1:64   HGE IgM Titer Negative Neg:<1:20  Babesia microti Antibody Panel  Result Value Ref Range   Babesia microti IgM <1:10 Neg:<1:10   Babesia microti IgG <1:10 Neg:<1:10      Assessment & Plan:   Problem List Items Addressed This Visit      Digestive   GERD without esophagitis    Exacerbated with her fosamax. Will try to get her on reclast- referral generated. Continue current regimen. Call with any concerns.       Relevant Medications   ranitidine (ZANTAC) 150 MG tablet   Other Relevant Orders   CBC with Differential/Platelet   Comprehensive metabolic panel   TSH   UA/M w/rflx Culture, Routine     Musculoskeletal and Integument   Osteoporosis    Having bad GERD on foxamax. Would like to continue reclast- referral to endocrine made today.      Relevant Orders   Ambulatory referral to Endocrinology     Other   Iron deficiency anemia    Rechecking levels today. Call with any concerns. Continue iron.       Relevant Medications   ferrous sulfate 325 (65 FE) MG EC tablet   Other Relevant Orders   CBC with Differential/Platelet   Comprehensive metabolic panel   Iron and TIBC   TSH   UA/M w/rflx Culture, Routine   Binge eating  disorder    Stable on current regimen. Continue current regimen. 3 month supply given today. Call with any concerns.       Relevant Medications   lisdexamfetamine (VYVANSE) 60 MG capsule   Other Relevant Orders   Comprehensive metabolic panel   Lipid Panel w/o Chol/HDL Ratio   TSH   UA/M w/rflx Culture, Routine    Other Visit Diagnoses    Routine general medical examination at a health care facility    -  Primary   Vaccines updated. Screening labs checked today. Pap N/A. Mammogram ordered. Colonoscopy up to date. Continue diet and exercise.    Screening for breast cancer       Mammogram ordered today.   Relevant Orders   MM DIGITAL SCREENING BILATERAL   Left hip pain       Likely arthritis. Will increase naproxen. Will get x-ray. Await results. May need to see orthopedics. Call with any concerns.    Relevant Orders   DG HIP UNILAT WITH PELVIS  2-3 VIEWS LEFT       Follow up plan: Return in about 3 months (around 02/26/2018) for follow up binge eating and hip.   LABORATORY TESTING:  - Pap smear: not applicable  IMMUNIZATIONS:  - Tdap: Tetanus vaccination status reviewed: last tetanus booster within 10 years. - Influenza: Up to date - Pneumovax: Administered today - Prevnar: Up to date - Zostavax vaccine: Up to date  SCREENING: -Mammogram: Ordered today  - Colonoscopy: Up to date  - Bone Density:Up to date    PATIENT COUNSELING:   Advised to take 1 mg of folate supplement per day if capable of pregnancy.   Sexuality: Discussed sexually transmitted diseases, partner selection, use of condoms, avoidance of unintended pregnancy  and contraceptive alternatives.   Advised to avoid cigarette smoking.  I discussed with the patient that most people either abstain from alcohol or drink within safe limits (<=14/week and <=4 drinks/occasion for males, <=7/weeks and <= 3 drinks/occasion for females) and that the risk for alcohol disorders and other health effects rises  proportionally with the number of drinks per week and how often a drinker exceeds daily limits.  Discussed cessation/primary prevention of drug use and availability of treatment for abuse.   Diet: Encouraged to adjust caloric intake to maintain  or achieve ideal body weight, to reduce intake of dietary saturated fat and total fat, to limit sodium intake by avoiding high sodium foods and not adding table salt, and to maintain adequate dietary potassium and calcium preferably from fresh fruits, vegetables, and low-fat dairy products.    stressed the importance of regular exercise  Injury prevention: Discussed safety belts, safety helmets, smoke detector, smoking near bedding or upholstery.   Dental health: Discussed importance of regular tooth brushing, flossing, and dental visits.    NEXT PREVENTATIVE PHYSICAL DUE IN 1 YEAR. Return in about 3 months (around 02/26/2018) for follow up binge eating and hip.

## 2017-11-28 NOTE — Assessment & Plan Note (Signed)
Rechecking levels today. Call with any concerns. Continue iron.

## 2017-11-28 NOTE — Assessment & Plan Note (Signed)
Having bad GERD on foxamax. Would like to continue reclast- referral to endocrine made today.

## 2017-11-28 NOTE — Progress Notes (Signed)
BP 126/84 (BP Location: Left Arm, Patient Position: Sitting, Cuff Size: Normal)   Pulse 71   Temp 98.5 F (36.9 C)   Ht 5' 9.3" (1.76 m)   Wt 211 lb 7 oz (95.9 kg)   SpO2 100%   BMI 30.95 kg/m    Subjective:    Patient ID: Mckenzie Webster, female    DOB: [birth year], 67 y.o.   MRN: [id number]  HPI: Mckenzie Webster is a 67 y.o. female presenting on 11/28/2017 for comprehensive medical examination. Current medical complaints include:  ANEMIA Anemia status: controlled Etiology of anemia: iron deficiency Duration of anemia treatment: chronic Compliance with treatment: excellent compliance Iron supplementation side effects: no Severity of anemia: moderate Fatigue: no Decreased exercise tolerance: no  Dyspnea on exertion: no Palpitations: no Bleeding: no Pica: no  BINGE EATING- stable. Medicine continues to work. Not losing weight, but has been controlling her appetite.   HIP PAIN Duration: chronic- significantly worse in the last 6 months Involved hip: left  Mechanism of injury: unknown Location: deep Onset: gradual  Severity: moderate to severe Quality: dull and aching Frequency: constant, worse with activity and changing positions Radiation: no Aggravating factors: walking, working in the yard   Alleviating factors: NSAIDs and rest  Status: worse Treatments attempted: rest and aleve   Relief with NSAIDs?: moderate Weakness with weight bearing: no Weakness with walking: no Paresthesias / decreased sensation: no Swelling: no Redness:no Fevers: no  She currently lives with: Alone Menopausal Symptoms: no  Depression Screen done today and results listed below:  Depression screen [facility] 2/9 11/28/2017 11/28/2017 11/24/2016 10/13/2016 01/04/2016  Decreased Interest 0 0 0 0 2  Down, Depressed, Hopeless 0 0 0 0 2  PHQ - 2 Score 0 0 0 0 4  Altered sleeping 1 1 - - 2  Tired, decreased energy 0 - - - 3  Change in appetite 1 1 - - 3  Feeling bad or failure about yourself  1 1  - - 3  Trouble concentrating 0 0 - - 1  Moving slowly or fidgety/restless 0 0 - - 0  Suicidal thoughts 0 0 - - 0  PHQ-9 Score 3 3 - - 16  Difficult doing work/chores - - - - Somewhat difficult     Past Medical History:  Past Medical History:  Diagnosis Date  . Anemia   . Cataract   . Dry eye syndrome   . Stomach ulcer     Surgical History:  Past Surgical History:  Procedure Laterality Date  . COLONOSCOPY WITH PROPOFOL N/A 04/27/2017   Procedure: COLONOSCOPY WITH PROPOFOL;  Surgeon: Jonathon Bellows, MD;  Location: [facility] ENDOSCOPY;  Service: Endoscopy;  Laterality: N/A;  . EYE SURGERY     Cataract  . FOOT SURGERY     Change the alignment of the toes to stop the formation of corns  . gastric bypass  1981    Medications:  Current Outpatient Medications on File Prior to Visit  Medication Sig  . alendronate (FOSAMAX) 70 MG tablet Take 1 tablet (70 mg total) by mouth once a week. Take with a full glass of water on an empty stomach.  . ferrous sulfate 325 (65 FE) MG EC tablet Take 1 tablet (325 mg total) by mouth 3 (three) times daily with meals. (Patient taking differently: Take 325 mg by mouth daily. )  . lisdexamfetamine (VYVANSE) 60 MG capsule Take 1 capsule (60 mg total) by mouth daily.  . naproxen sodium (ANAPROX) 220 MG tablet Take  440 mg by mouth daily.  . ranitidine (ZANTAC) 150 MG tablet Take 300 mg by mouth daily.    No current facility-administered medications on file prior to visit.     Allergies:  No Known Allergies  Social History:  Social History   Socioeconomic History  . Marital status: Divorced    Spouse name: Not on file  . Number of children: Not on file  . Years of education: Not on file  . Highest education level: Not on file  Social Needs  . Financial resource strain: Not on file  . Food insecurity - worry: Not on file  . Food insecurity - inability: Not on file  . Transportation needs - medical: Not on file  . Transportation needs - non-medical:  Not on file  Occupational History  . Not on file  Tobacco Use  . Smoking status: Former Smoker    Last attempt to quit: 10/31/1995    Years since quitting: 22.0  . Smokeless tobacco: Former Network engineer and Sexual Activity  . Alcohol use: Yes    Comment: on occasion  . Drug use: No  . Sexual activity: Not Currently  Other Topics Concern  . Not on file  Social History Narrative  . Not on file   Social History   Tobacco Use  Smoking Status Former Smoker  . Last attempt to quit: 10/31/1995  . Years since quitting: 22.0  Smokeless Tobacco Former Systems developer   Social History   Substance and Sexual Activity  Alcohol Use Yes   Comment: on occasion    Family History:  Family History  Problem Relation Age of Onset  . Heart disease Mother   . COPD Mother   . Heart disease Father   . Hypertension Father   . Heart disease Maternal Grandfather   . Alzheimer's disease Paternal Grandmother   . Breast cancer Neg Hx     Past medical history, surgical history, medications, allergies, family history and social history reviewed with patient today and changes made to appropriate areas of the chart.   Review of Systems  Constitutional: Negative.   HENT: Positive for congestion and hearing loss. Negative for ear discharge, ear pain, nosebleeds, sinus pain, sore throat and tinnitus.   Eyes: Negative.   Respiratory: Negative.  Negative for stridor.   Cardiovascular: Negative.   Gastrointestinal: Negative.   Genitourinary: Negative.   Musculoskeletal: Positive for joint pain. Negative for back pain, falls, myalgias and neck pain.  Skin: Negative.   Neurological: Negative.   Endo/Heme/Allergies: Negative.   Psychiatric/Behavioral: Positive for depression. Negative for hallucinations, memory loss, substance abuse and suicidal ideas. The patient is not nervous/anxious and does not have insomnia.     All other ROS negative except what is listed above and in the HPI.      Objective:    BP  126/84 (BP Location: Left Arm, Patient Position: Sitting, Cuff Size: Normal)   Pulse 71   Temp 98.5 F (36.9 C)   Ht 5' 9.3" (1.76 m)   Wt 211 lb 7 oz (95.9 kg)   SpO2 100%   BMI 30.95 kg/m   Wt Readings from Last 3 Encounters:  11/28/17 211 lb 7 oz (95.9 kg)  08/17/17 207 lb 4 oz (94 kg)  06/08/17 209 lb 6 oz (95 kg)    Physical Exam  Results for orders placed or performed in visit on 05/16/17  CBC with Differential/Platelet  Result Value Ref Range   WBC 11.3 (H) 3.4 - 10.8 x10E3/uL  RBC 4.85 3.77 - 5.28 x10E6/uL   Hemoglobin 14.4 11.1 - 15.9 g/dL   Hematocrit 43.6 34.0 - 46.6 %   MCV 90 79 - 97 fL   MCH 29.7 26.6 - 33.0 pg   MCHC 33.0 31.5 - 35.7 g/dL   RDW 14.0 12.3 - 15.4 %   Platelets 287 150 - 379 x10E3/uL   Neutrophils 85 Not Estab. %   Lymphs 10 Not Estab. %   Monocytes 5 Not Estab. %   Eos 0 Not Estab. %   Basos 0 Not Estab. %   Neutrophils Absolute 9.6 (H) 1.4 - 7.0 x10E3/uL   Lymphocytes Absolute 1.1 0.7 - 3.1 x10E3/uL   Monocytes Absolute 0.5 0.1 - 0.9 x10E3/uL   EOS (ABSOLUTE) 0.0 0.0 - 0.4 x10E3/uL   Basophils Absolute 0.0 0.0 - 0.2 x10E3/uL   Immature Granulocytes 0 Not Estab. %   Immature Grans (Abs) 0.0 0.0 - 0.1 x10E3/uL  Comprehensive metabolic panel  Result Value Ref Range   Glucose 97 65 - 99 mg/dL   BUN 21 8 - 27 mg/dL   Creatinine, Ser 0.80 0.57 - 1.00 mg/dL   GFR calc non Af Amer 78 >59 mL/min/1.73   GFR calc Af Amer 89 >59 mL/min/1.73   BUN/Creatinine Ratio 26 12 - 28   Sodium 140 134 - 144 mmol/L   Potassium 4.5 3.5 - 5.2 mmol/L   Chloride 103 96 - 106 mmol/L   CO2 22 20 - 29 mmol/L   Calcium 9.3 8.7 - 10.3 mg/dL   Total Protein 6.7 6.0 - 8.5 g/dL   Albumin 4.3 3.6 - 4.8 g/dL   Globulin, Total 2.4 1.5 - 4.5 g/dL   Albumin/Globulin Ratio 1.8 1.2 - 2.2   Bilirubin Total 0.6 0.0 - 1.2 mg/dL   Alkaline Phosphatase 106 39 - 117 IU/L   AST 31 0 - 40 IU/L   ALT 43 (H) 0 - 32 IU/L  Thyroid Panel With TSH  Result Value Ref Range   TSH  2.130 0.450 - 4.500 uIU/mL   T4, Total 8.8 4.5 - 12.0 ug/dL   T3 Uptake Ratio 26 24 - 39 %   Free Thyroxine Index 2.3 1.2 - 4.9  VITAMIN D 25 Hydroxy (Vit-D Deficiency, Fractures)  Result Value Ref Range   Vit D, 25-Hydroxy 26.2 (L) 30.0 - 100.0 ng/mL  Lyme Ab/Western Blot Reflex  Result Value Ref Range   Lyme IgG/IgM Ab <0.91 0.00 - 0.90 ISR   LYME DISEASE AB, QUANT, IGM <0.80 0.00 - 0.79 index  Rocky mtn spotted fvr abs pnl(IgG+IgM)  Result Value Ref Range   RMSF IgG Negative Negative   RMSF IgM 0.32 0.00 - 3.66 index  Ehrlichia Antibody Panel  Result Value Ref Range   E.Chaffeensis (HME) IgG Negative Neg:<1:64   E. Chaffeensis (HME) IgM Titer Negative Neg:<1:20   HGE IgG Titer Negative Neg:<1:64   HGE IgM Titer Negative Neg:<1:20  Babesia microti Antibody Panel  Result Value Ref Range   Babesia microti IgM <1:10 Neg:<1:10   Babesia microti IgG <1:10 Neg:<1:10      Assessment & Plan:   Problem List Items Addressed This Visit      Digestive   GERD without esophagitis   Relevant Orders   CBC with Differential/Platelet   Comprehensive metabolic panel   TSH   UA/M w/rflx Culture, Routine     Other   Iron deficiency anemia   Relevant Orders   CBC with Differential/Platelet   Comprehensive metabolic panel  Iron and TIBC   TSH   UA/M w/rflx Culture, Routine   Binge eating disorder   Relevant Orders   Comprehensive metabolic panel   Lipid Panel w/o Chol/HDL Ratio   TSH   UA/M w/rflx Culture, Routine    Other Visit Diagnoses    Routine general medical examination at a health care facility    -  Primary       Follow up plan: No Follow-up on file.   LABORATORY TESTING:  - Pap smear: not applicable  IMMUNIZATIONS:   - Tdap: Tetanus vaccination status reviewed: last tetanus booster within 10 years. - Influenza: Up to date - Pneumovax: Administered today - Prevnar: Up to date - Zostavax vaccine: Up to date  SCREENING: -Mammogram: Ordered today  -  Colonoscopy: Up to date  - Bone Density: Up to date   PATIENT COUNSELING:   Advised to take 1 mg of folate supplement per day if capable of pregnancy.   Sexuality: Discussed sexually transmitted diseases, partner selection, use of condoms, avoidance of unintended pregnancy  and contraceptive alternatives.   Advised to avoid cigarette smoking.  I discussed with the patient that most people either abstain from alcohol or drink within safe limits (<=14/week and <=4 drinks/occasion for males, <=7/weeks and <= 3 drinks/occasion for females) and that the risk for alcohol disorders and other health effects rises proportionally with the number of drinks per week and how often a drinker exceeds daily limits.  Discussed cessation/primary prevention of drug use and availability of treatment for abuse.   Diet: Encouraged to adjust caloric intake to maintain  or achieve ideal body weight, to reduce intake of dietary saturated fat and total fat, to limit sodium intake by avoiding high sodium foods and not adding table salt, and to maintain adequate dietary potassium and calcium preferably from fresh fruits, vegetables, and low-fat dairy products.    stressed the importance of regular exercise  Injury prevention: Discussed safety belts, safety helmets, smoke detector, smoking near bedding or upholstery.   Dental health: Discussed importance of regular tooth brushing, flossing, and dental visits.    NEXT PREVENTATIVE PHYSICAL DUE IN 1 YEAR. No Follow-up on file.

## 2017-11-28 NOTE — Assessment & Plan Note (Signed)
Stable on current regimen. Continue current regimen. 3 month supply given today. Call with any concerns.

## 2017-11-29 ENCOUNTER — Encounter: Payer: Self-pay | Admitting: Family Medicine

## 2017-11-29 ENCOUNTER — Ambulatory Visit
Admission: RE | Admit: 2017-11-29 | Discharge: 2017-11-29 | Disposition: A | Discharge status: Home / Self Care | Payer: PPO | Source: Ambulatory Visit | Attending: Family Medicine | Admitting: Family Medicine

## 2017-11-29 ENCOUNTER — Telehealth: Payer: Self-pay | Admitting: Family Medicine

## 2017-11-29 DIAGNOSIS — M25552 Pain in left hip: Secondary | ICD-10-CM

## 2017-11-29 LAB — CBC WITH DIFFERENTIAL/PLATELET
BASOS: 0 %
Basophils Absolute: 0 10*3/uL (ref 0.0–0.2)
EOS (ABSOLUTE): 0.2 10*3/uL (ref 0.0–0.4)
EOS: 5 %
HEMATOCRIT: 43.5 % (ref 34.0–46.6)
Hemoglobin: 14.5 g/dL (ref 11.1–15.9)
IMMATURE GRANS (ABS): 0 10*3/uL (ref 0.0–0.1)
IMMATURE GRANULOCYTES: 0 %
LYMPHS: 23 %
Lymphocytes Absolute: 1 10*3/uL (ref 0.7–3.1)
MCH: 30.1 pg (ref 26.6–33.0)
MCHC: 33.3 g/dL (ref 31.5–35.7)
MCV: 90 fL (ref 79–97)
MONOCYTES: 10 %
Monocytes Absolute: 0.5 10*3/uL (ref 0.1–0.9)
NEUTROS PCT: 62 %
Neutrophils Absolute: 2.7 10*3/uL (ref 1.4–7.0)
Platelets: 243 10*3/uL (ref 150–379)
RBC: 4.81 x10E6/uL (ref 3.77–5.28)
RDW: 13.9 % (ref 12.3–15.4)
WBC: 4.4 10*3/uL (ref 3.4–10.8)

## 2017-11-29 LAB — COMPREHENSIVE METABOLIC PANEL
A/G RATIO: 1.8 (ref 1.2–2.2)
ALT: 15 IU/L (ref 0–32)
AST: 16 IU/L (ref 0–40)
Albumin: 4.2 g/dL (ref 3.6–4.8)
Alkaline Phosphatase: 88 IU/L (ref 39–117)
BUN/Creatinine Ratio: 35 — ABNORMAL HIGH (ref 12–28)
BUN: 28 mg/dL — ABNORMAL HIGH (ref 8–27)
Bilirubin Total: 0.5 mg/dL (ref 0.0–1.2)
CALCIUM: 9.2 mg/dL (ref 8.7–10.3)
CO2: 24 mmol/L (ref 20–29)
CREATININE: 0.81 mg/dL (ref 0.57–1.00)
Chloride: 103 mmol/L (ref 96–106)
GFR, EST AFRICAN AMERICAN: 88 mL/min/{1.73_m2} (ref >59)
GFR, EST NON AFRICAN AMERICAN: 76 mL/min/{1.73_m2} (ref >59)
Globulin, Total: 2.3 g/dL (ref 1.5–4.5)
Glucose: 78 mg/dL (ref 65–99)
POTASSIUM: 4.8 mmol/L (ref 3.5–5.2)
Sodium: 141 mmol/L (ref 134–144)
TOTAL PROTEIN: 6.5 g/dL (ref 6.0–8.5)

## 2017-11-29 LAB — UA/M W/RFLX CULTURE, ROUTINE
Bilirubin, UA: NEGATIVE
Glucose, UA: NEGATIVE
Ketones, UA: NEGATIVE
LEUKOCYTES UA: NEGATIVE
NITRITE UA: NEGATIVE
PH UA: 6 (ref 5.0–7.5)
Protein, UA: NEGATIVE
RBC, UA: NEGATIVE
Specific Gravity, UA: 1.025 (ref 1.005–1.030)
UUROB: 1 mg/dL (ref 0.2–1.0)

## 2017-11-29 LAB — LIPID PANEL W/O CHOL/HDL RATIO
CHOLESTEROL TOTAL: 169 mg/dL (ref 100–199)
HDL: 73 mg/dL (ref >39)
LDL Calculated: 88 mg/dL (ref 0–99)
TRIGLYCERIDES: 42 mg/dL (ref 0–149)
VLDL Cholesterol Cal: 8 mg/dL (ref 5–40)

## 2017-11-29 LAB — IRON AND TIBC
Iron Saturation: 28 % (ref 15–55)
Iron: 82 ug/dL (ref 27–139)
Total Iron Binding Capacity: 295 ug/dL (ref 250–450)
UIBC: 213 ug/dL (ref 118–369)

## 2017-11-29 LAB — TSH: TSH: 3.79 u[IU]/mL (ref 0.450–4.500)

## 2017-11-29 NOTE — Telephone Encounter (Signed)
Please let Mckenzie Webster know that her x-ray came back normal. We can move forward with getting an MRI or we can get her into see the orthopedist to see what they recommend, but it didn't look like there was significant arthritis on the exam. Her labs also looked really good! Everything looks nice and normal and she'll be able to see it on mychart momentarily. Thanks!

## 2017-11-29 NOTE — Telephone Encounter (Signed)
Called and left a detailed message on patients voice mail.  

## 2017-11-30 ENCOUNTER — Encounter: Payer: Self-pay | Admitting: Family Medicine

## 2017-12-17 ENCOUNTER — Other Ambulatory Visit: Payer: Self-pay | Admitting: Unknown Physician Specialty

## 2017-12-17 NOTE — Telephone Encounter (Signed)
Your patient.  Thanks 

## 2018-02-27 ENCOUNTER — Ambulatory Visit: Payer: PPO | Admitting: Family Medicine

## 2018-03-27 ENCOUNTER — Ambulatory Visit: Payer: PPO | Admitting: Family Medicine

## 2018-04-26 ENCOUNTER — Encounter: Payer: Self-pay | Admitting: Family Medicine

## 2018-04-26 ENCOUNTER — Ambulatory Visit (INDEPENDENT_AMBULATORY_CARE_PROVIDER_SITE_OTHER): Payer: PPO | Admitting: Family Medicine

## 2018-04-26 VITALS — BP 134/85 | HR 62 | Temp 97.9°F | Wt 208.0 lb

## 2018-04-26 DIAGNOSIS — M810 Age-related osteoporosis without current pathological fracture: Secondary | ICD-10-CM | POA: Diagnosis not present

## 2018-04-26 DIAGNOSIS — F5081 Binge eating disorder: Secondary | ICD-10-CM | POA: Diagnosis not present

## 2018-04-26 DIAGNOSIS — D509 Iron deficiency anemia, unspecified: Secondary | ICD-10-CM | POA: Diagnosis not present

## 2018-04-26 MED ORDER — LISDEXAMFETAMINE DIMESYLATE 60 MG PO CAPS: 60.0000 mg | ORAL_CAPSULE | Freq: Every day | ORAL | 0 refills | Status: DC

## 2018-04-26 MED ORDER — LISDEXAMFETAMINE DIMESYLATE 60 MG PO CAPS: 60.0000 mg | ORAL_CAPSULE | ORAL | 0 refills | Status: DC

## 2018-04-26 NOTE — Assessment & Plan Note (Signed)
Under good control on current regimen. Continue to monitor. Refills for 3 months given today. Call with any concerns.

## 2018-04-26 NOTE — Assessment & Plan Note (Signed)
Due for repeat exam in 2021. Call with any concerns.

## 2018-04-26 NOTE — Assessment & Plan Note (Signed)
Rechecking labs. Await results. Call with any concerns.  °

## 2018-04-26 NOTE — Progress Notes (Signed)
BP 134/85 (BP Location: Left Arm, Patient Position: Sitting, Cuff Size: Large)   Pulse 62   Temp 97.9 F (36.6 C)   Wt 208 lb (94.3 kg)   SpO2 98%   BMI 29.84 kg/m    Subjective:    Patient ID: Mckenzie Webster, female    DOB: 24-Nov-1950, 67 y.o.   MRN: [id number]  HPI: Mckenzie Webster is a 67 y.o. female  Chief Complaint  Patient presents with  . Binge eating   BINGE EATING DISORDER- still feels like the medicine is helping. No funny side-effects.   Osteoporosis- didn't want to move onto the reclast due to cost. Will hold off on it for now.   ANEMIA Anemia status: controlled Etiology of anemia: iron def Duration of anemia treatment:chronic  Compliance with treatment: excellent compliance Iron supplementation side effects: no Severity of anemia: moderate Fatigue: yes Decreased exercise tolerance: no  Dyspnea on exertion: no Palpitations: no Bleeding: no Pica: no   Relevant past medical, surgical, family and social history reviewed and updated as indicated. Interim medical history since our last visit reviewed. Allergies and medications reviewed and updated.  Review of Systems  Constitutional: Negative.   Respiratory: Negative.   Cardiovascular: Negative.   Gastrointestinal: Negative.   Musculoskeletal: Positive for arthralgias and myalgias. Negative for back pain, gait problem, joint swelling, neck pain and neck stiffness.  Skin: Negative.   Neurological: Negative.   Psychiatric/Behavioral: Negative.     Per HPI unless specifically indicated above     Objective:    BP 134/85 (BP Location: Left Arm, Patient Position: Sitting, Cuff Size: Large)   Pulse 62   Temp 97.9 F (36.6 C)   Wt 208 lb (94.3 kg)   SpO2 98%   BMI 29.84 kg/m   Wt Readings from Last 3 Encounters:  04/26/18 208 lb (94.3 kg)  11/28/17 211 lb 7 oz (95.9 kg)  11/28/17 211 lb 7 oz (95.9 kg)    Physical Exam  Constitutional: She is oriented to person, place, and time. She appears  well-developed and well-nourished. No distress.  HENT:  Head: Normocephalic and atraumatic.  Right Ear: Hearing normal.  Left Ear: Hearing normal.  Nose: Nose normal.  Eyes: Conjunctivae and lids are normal. Right eye exhibits no discharge. Left eye exhibits no discharge. No scleral icterus.  Cardiovascular: Normal rate, regular rhythm, normal heart sounds and intact distal pulses. Exam reveals no gallop and no friction rub.  No murmur heard. Pulmonary/Chest: Effort normal and breath sounds normal. No stridor. No respiratory distress. She has no wheezes. She has no rales. She exhibits no tenderness.  Musculoskeletal: Normal range of motion.  Neurological: She is alert and oriented to person, place, and time.  Skin: Skin is warm, dry and intact. Capillary refill takes less than 2 seconds. No rash noted. She is not diaphoretic. No erythema. No pallor.  Psychiatric: She has a normal mood and affect. Her speech is normal and behavior is normal. Judgment and thought content normal. Cognition and memory are normal.    Results for orders placed or performed in visit on 11/28/17  CBC with Differential/Platelet  Result Value Ref Range   WBC 4.4 3.4 - 10.8 x10E3/uL   RBC 4.81 3.77 - 5.28 x10E6/uL   Hemoglobin 14.5 11.1 - 15.9 g/dL   Hematocrit 43.5 34.0 - 46.6 %   MCV 90 79 - 97 fL   MCH 30.1 26.6 - 33.0 pg   MCHC 33.3 31.5 - 35.7 g/dL   RDW 13.9  12.3 - 15.4 %   Platelets 243 150 - 379 x10E3/uL   Neutrophils 62 Not Estab. %   Lymphs 23 Not Estab. %   Monocytes 10 Not Estab. %   Eos 5 Not Estab. %   Basos 0 Not Estab. %   Neutrophils Absolute 2.7 1.4 - 7.0 x10E3/uL   Lymphocytes Absolute 1.0 0.7 - 3.1 x10E3/uL   Monocytes Absolute 0.5 0.1 - 0.9 x10E3/uL   EOS (ABSOLUTE) 0.2 0.0 - 0.4 x10E3/uL   Basophils Absolute 0.0 0.0 - 0.2 x10E3/uL   Immature Granulocytes 0 Not Estab. %   Immature Grans (Abs) 0.0 0.0 - 0.1 x10E3/uL  Comprehensive metabolic panel  Result Value Ref Range   Glucose 78  65 - 99 mg/dL   BUN 28 (H) 8 - 27 mg/dL   Creatinine, Ser 0.81 0.57 - 1.00 mg/dL   GFR calc non Af Amer 76 >59 mL/min/1.73   GFR calc Af Amer 88 >59 mL/min/1.73   BUN/Creatinine Ratio 35 (H) 12 - 28   Sodium 141 134 - 144 mmol/L   Potassium 4.8 3.5 - 5.2 mmol/L   Chloride 103 96 - 106 mmol/L   CO2 24 20 - 29 mmol/L   Calcium 9.2 8.7 - 10.3 mg/dL   Total Protein 6.5 6.0 - 8.5 g/dL   Albumin 4.2 3.6 - 4.8 g/dL   Globulin, Total 2.3 1.5 - 4.5 g/dL   Albumin/Globulin Ratio 1.8 1.2 - 2.2   Bilirubin Total 0.5 0.0 - 1.2 mg/dL   Alkaline Phosphatase 88 39 - 117 IU/L   AST 16 0 - 40 IU/L   ALT 15 0 - 32 IU/L  Iron and TIBC  Result Value Ref Range   Total Iron Binding Capacity 295 250 - 450 ug/dL   UIBC 213 118 - 369 ug/dL   Iron 82 27 - 139 ug/dL   Iron Saturation 28 15 - 55 %  Lipid Panel w/o Chol/HDL Ratio  Result Value Ref Range   Cholesterol, Total 169 100 - 199 mg/dL   Triglycerides 42 0 - 149 mg/dL   HDL 73 >39 mg/dL   VLDL Cholesterol Cal 8 5 - 40 mg/dL   LDL Calculated 88 0 - 99 mg/dL  TSH  Result Value Ref Range   TSH 3.790 0.450 - 4.500 uIU/mL  UA/M w/rflx Culture, Routine  Result Value Ref Range   Specific Gravity, UA 1.025 1.005 - 1.030   pH, UA 6.0 5.0 - 7.5   Color, UA Yellow Yellow   Appearance Ur Hazy (A) Clear   Leukocytes, UA Negative Negative   Protein, UA Negative Negative/Trace   Glucose, UA Negative Negative   Ketones, UA Negative Negative   RBC, UA Negative Negative   Bilirubin, UA Negative Negative   Urobilinogen, Ur 1.0 0.2 - 1.0 mg/dL   Nitrite, UA Negative Negative      Assessment & Plan:   Problem List Items Addressed This Visit      Musculoskeletal and Integument   Osteoporosis    Due for repeat exam in 2021. Call with any concerns.         Other   Iron deficiency anemia    Rechecking labs. Await results. Call with any concerns.       Relevant Orders   CBC with Differential/Platelet   Iron and TIBC   Ferritin   Binge eating  disorder - Primary    Under good control on current regimen. Continue to monitor. Refills for 3 months given today. Call with  any concerns.       Relevant Medications   lisdexamfetamine (VYVANSE) 60 MG capsule (Start on 06/25/2018)       Follow up plan: Return in about 3 months (around 07/27/2018) for Binge Eating.

## 2018-04-27 LAB — IRON AND TIBC
Iron Saturation: 31 % (ref 15–55)
Iron: 84 ug/dL (ref 27–139)
Total Iron Binding Capacity: 272 ug/dL (ref 250–450)
UIBC: 188 ug/dL (ref 118–369)

## 2018-04-27 LAB — CBC WITH DIFFERENTIAL/PLATELET
Basophils Absolute: 0 x10E3/uL (ref 0.0–0.2)
Basos: 1 %
EOS (ABSOLUTE): 0.3 x10E3/uL (ref 0.0–0.4)
Eos: 7 %
Hematocrit: 42.9 % (ref 34.0–46.6)
Hemoglobin: 13.8 g/dL (ref 11.1–15.9)
Immature Grans (Abs): 0 x10E3/uL (ref 0.0–0.1)
Immature Granulocytes: 0 %
Lymphocytes Absolute: 1 x10E3/uL (ref 0.7–3.1)
Lymphs: 22 %
MCH: 29.3 pg (ref 26.6–33.0)
MCHC: 32.2 g/dL (ref 31.5–35.7)
MCV: 91 fL (ref 79–97)
Monocytes Absolute: 0.5 x10E3/uL (ref 0.1–0.9)
Monocytes: 11 %
Neutrophils Absolute: 2.7 x10E3/uL (ref 1.4–7.0)
Neutrophils: 59 %
Platelets: 236 x10E3/uL (ref 150–450)
RBC: 4.71 x10E6/uL (ref 3.77–5.28)
RDW: 13.9 % (ref 12.3–15.4)
WBC: 4.6 x10E3/uL (ref 3.4–10.8)

## 2018-04-27 LAB — FERRITIN: Ferritin: 58 ng/mL (ref 15–150)

## 2018-06-29 IMAGING — DX DG HIP (WITH OR WITHOUT PELVIS) 2-3V*L*
5 series · 5 of 5 positions shown · non-contrast
Comparison: None.

CLINICAL DATA: Chronic left hip pain.

EXAM:
DG HIP (WITH OR WITHOUT PELVIS) 2-3V LEFT

[pelvis ap (1 of 2)]
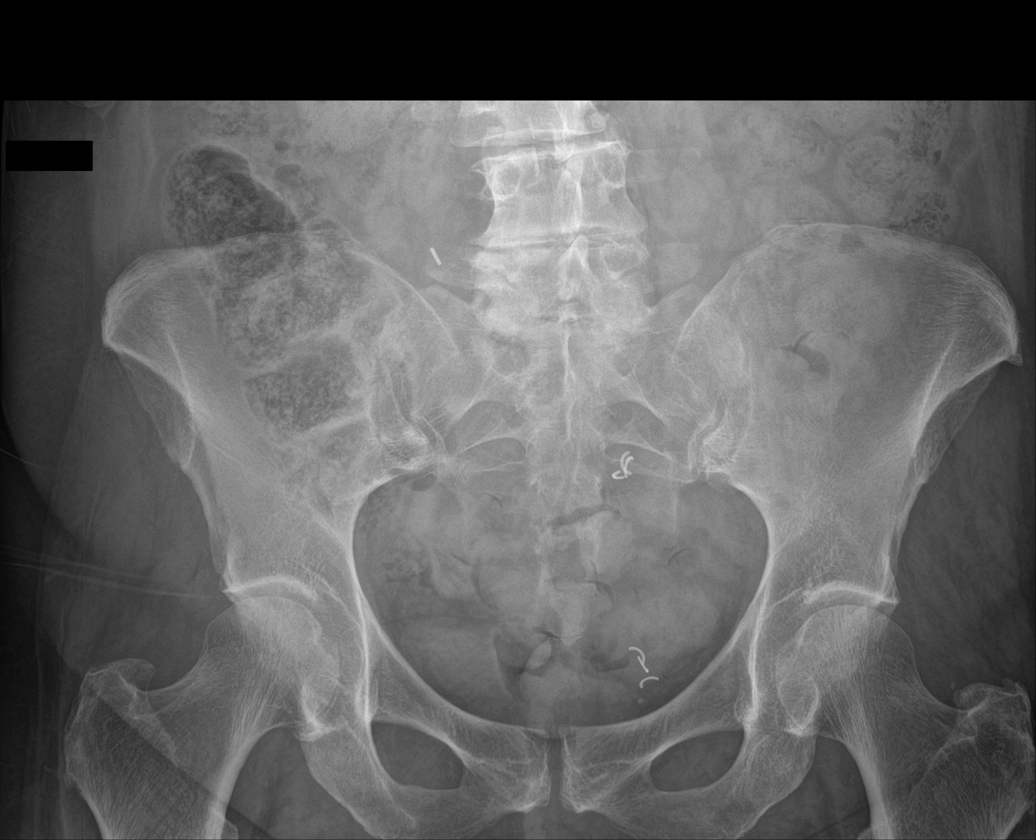

[pelvis ap (2 of 2)]
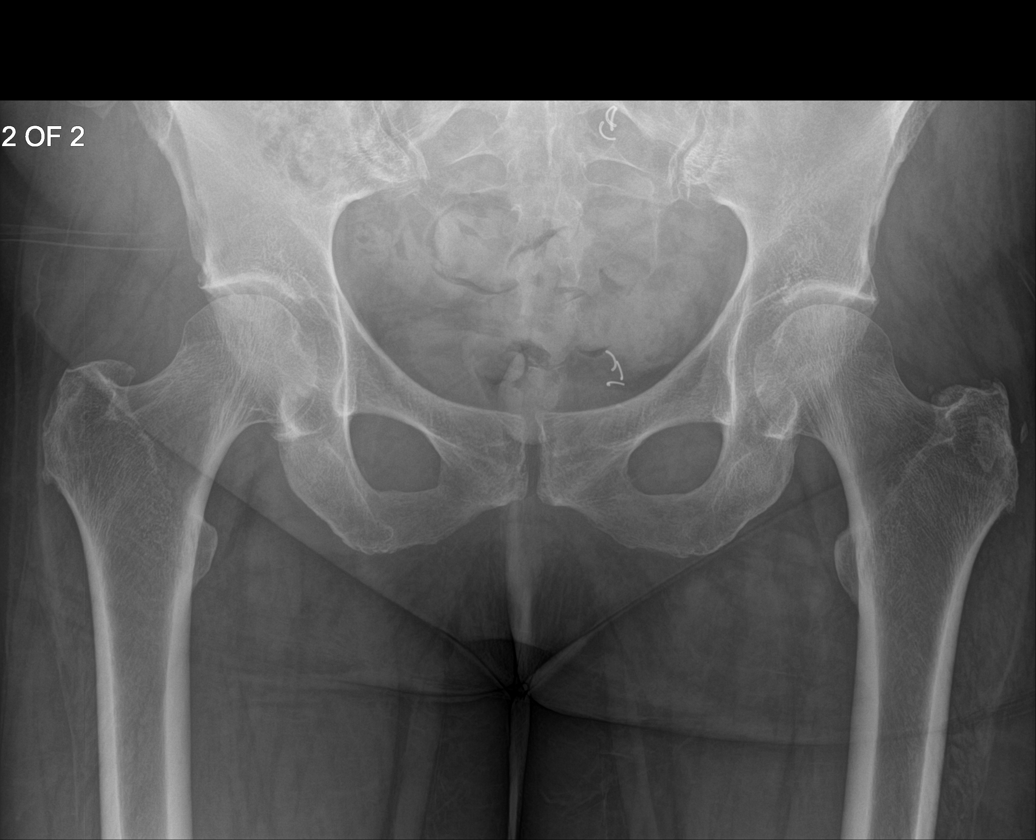

[hip ap (1 of 2)]
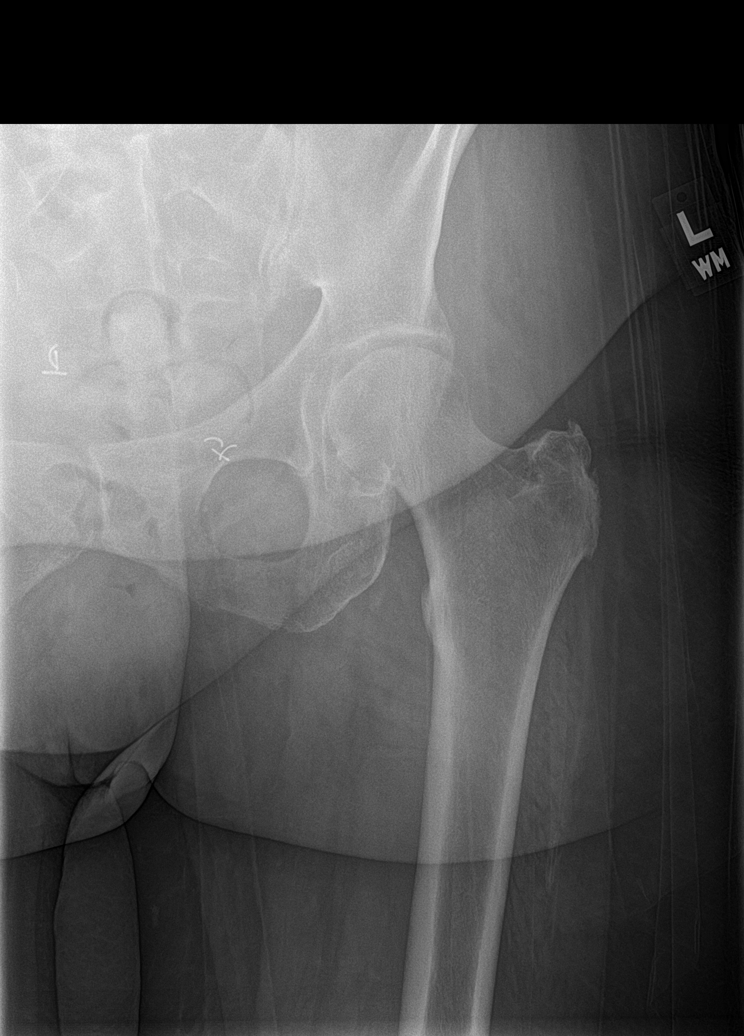

[hip lat]
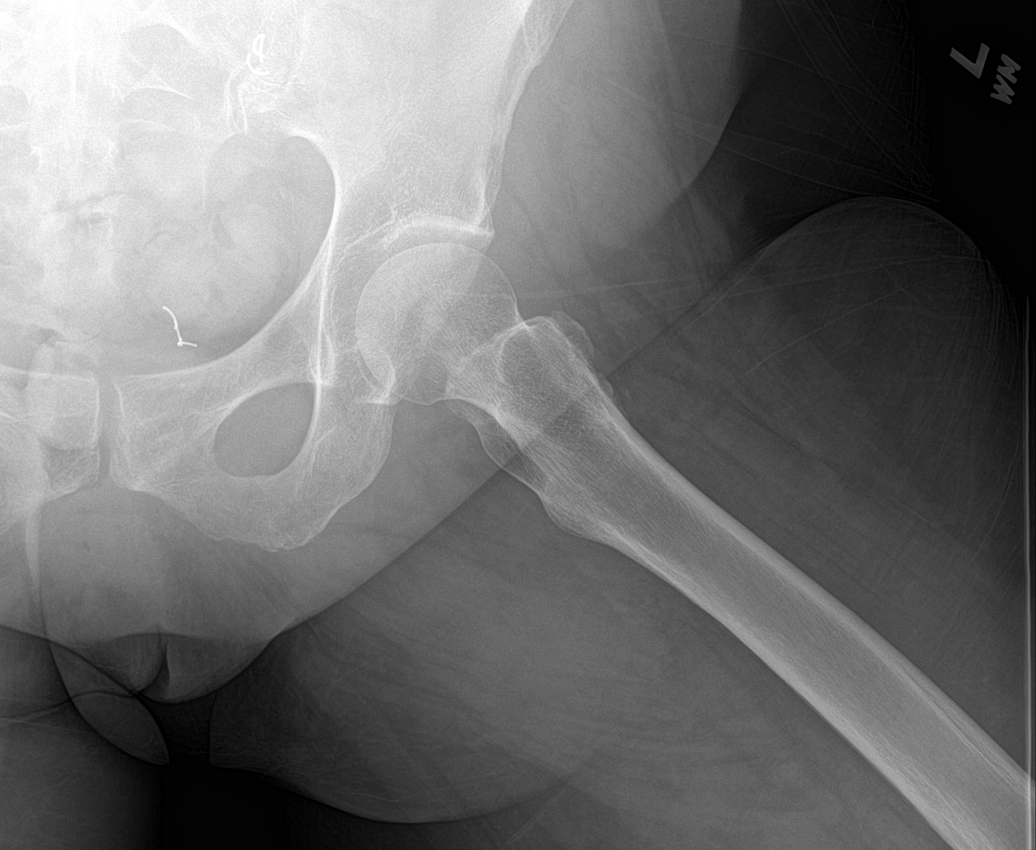

[hip ap (2 of 2)]
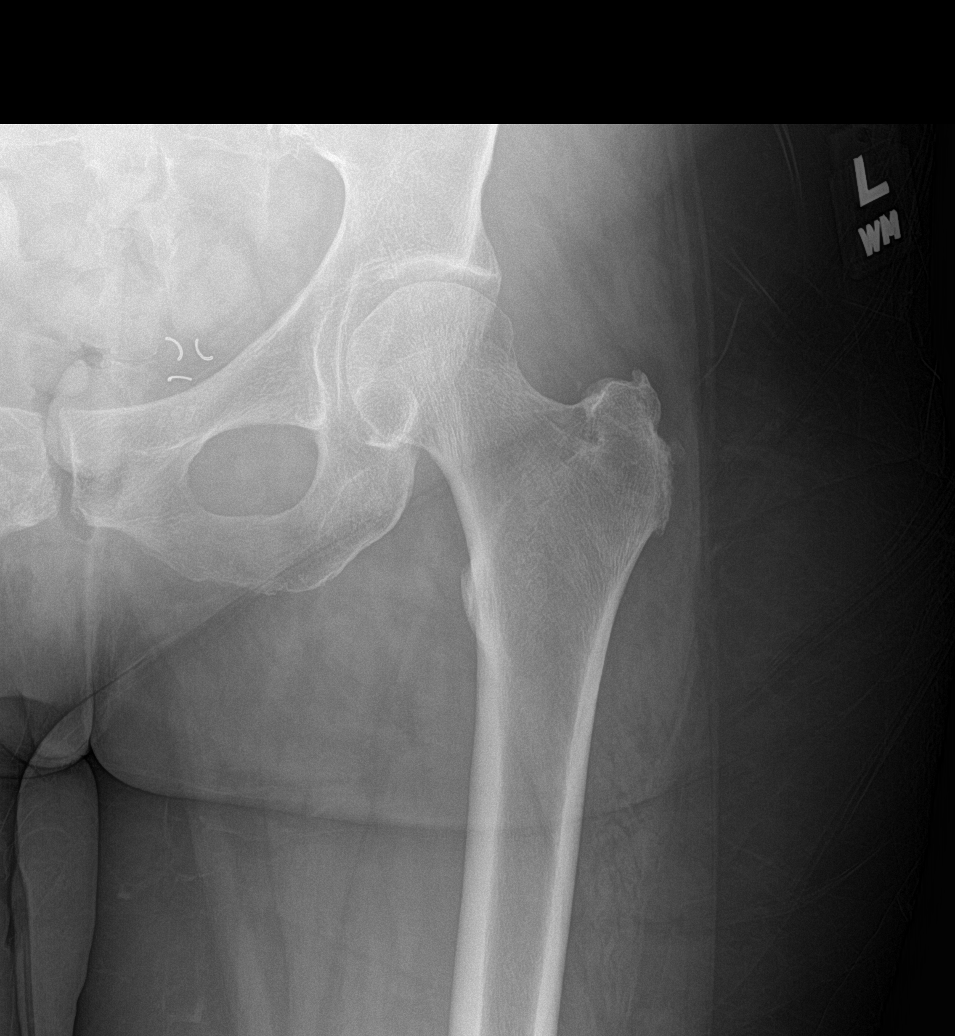

[5 of 5 positions shown; findings below may reference images not displayed]

FINDINGS: There is no evidence of hip fracture or dislocation. There is no
evidence of arthropathy or other focal bone abnormality.
IMPRESSION: Normal left hip.

## 2018-07-31 ENCOUNTER — Ambulatory Visit (INDEPENDENT_AMBULATORY_CARE_PROVIDER_SITE_OTHER): Payer: PPO | Admitting: Family Medicine

## 2018-07-31 ENCOUNTER — Encounter: Payer: Self-pay | Admitting: Family Medicine

## 2018-07-31 VITALS — BP 138/82 | HR 79 | Wt 213.4 lb

## 2018-07-31 DIAGNOSIS — F418 Other specified anxiety disorders: Secondary | ICD-10-CM

## 2018-07-31 DIAGNOSIS — Z23 Encounter for immunization: Secondary | ICD-10-CM

## 2018-07-31 DIAGNOSIS — F5081 Binge eating disorder: Secondary | ICD-10-CM | POA: Diagnosis not present

## 2018-07-31 MED ORDER — LISDEXAMFETAMINE DIMESYLATE 60 MG PO CAPS: 60.0000 mg | ORAL_CAPSULE | ORAL | 0 refills | Status: DC

## 2018-07-31 MED ORDER — LISDEXAMFETAMINE DIMESYLATE 60 MG PO CAPS: 60.0000 mg | ORAL_CAPSULE | Freq: Every day | ORAL | 0 refills | Status: DC

## 2018-07-31 NOTE — Progress Notes (Signed)
BP 138/82 (BP Location: Left Arm, Cuff Size: Normal)   Pulse 79   Wt 213 lb 6 oz (96.8 kg)   SpO2 97%   BMI 30.62 kg/m    Subjective:    Patient ID: Mckenzie Webster, female    DOB: 1951/01/17, 67 y.o.   MRN: [id number]  HPI: Mckenzie Webster is a 67 y.o. female  Chief Complaint  Patient presents with  . Binge Eating Disorder   Has been under a lot of stress with work. She notes that medicine continues to work. She is doing well with it. She occasionally forgets to take it. She feels like it helps. Tolerating medicine well. She admits that she continues to have horrible eating habits.  Depression screen [facility] 2/9 07/31/2018 11/28/2017 11/28/2017 11/24/2016 10/13/2016  Decreased Interest 2 0 0 0 0  Down, Depressed, Hopeless 2 0 0 0 0  PHQ - 2 Score 4 0 0 0 0  Altered sleeping 1 1 1  - -  Tired, decreased energy 1 0 - - -  Change in appetite 1 1 1  - -  Feeling bad or failure about yourself  3 1 1  - -  Trouble concentrating 0 0 0 - -  Moving slowly or fidgety/restless 0 0 0 - -  Suicidal thoughts 1 0 0 - -  PHQ-9 Score 11 3 3  - -  Difficult doing work/chores Somewhat difficult - - - -   GAD 7 : Generalized Anxiety Score 07/31/2018 01/04/2016  Nervous, Anxious, on Edge 0 1  Control/stop worrying 0 3  Worry too much - different things 0 3  Trouble relaxing 0 2  Restless 0 0  Easily annoyed or irritable 1 2  Afraid - awful might happen 0 1  Total GAD 7 Score 1 12  Anxiety Difficulty Very difficult Somewhat difficult    Relevant past medical, surgical, family and social history reviewed and updated as indicated. Interim medical history since our last visit reviewed. Allergies and medications reviewed and updated.  Review of Systems  Constitutional: Negative.   Respiratory: Negative.   Cardiovascular: Negative.   Psychiatric/Behavioral: Negative for agitation, behavioral problems, confusion, decreased concentration, dysphoric mood, hallucinations, self-injury, sleep disturbance and  suicidal ideas. The patient is nervous/anxious. The patient is not hyperactive.     Per HPI unless specifically indicated above     Objective:    BP 138/82 (BP Location: Left Arm, Cuff Size: Normal)   Pulse 79   Wt 213 lb 6 oz (96.8 kg)   SpO2 97%   BMI 30.62 kg/m   Wt Readings from Last 3 Encounters:  07/31/18 213 lb 6 oz (96.8 kg)  04/26/18 208 lb (94.3 kg)  11/28/17 211 lb 7 oz (95.9 kg)    Physical Exam  Constitutional: She is oriented to person, place, and time. She appears well-developed and well-nourished. No distress.  HENT:  Head: Normocephalic and atraumatic.  Right Ear: Hearing normal.  Left Ear: Hearing normal.  Nose: Nose normal.  Eyes: Conjunctivae and lids are normal. Right eye exhibits no discharge. Left eye exhibits no discharge. No scleral icterus.  Cardiovascular: Normal rate, regular rhythm, normal heart sounds and intact distal pulses. Exam reveals no gallop and no friction rub.  No murmur heard. Pulmonary/Chest: Effort normal and breath sounds normal. No stridor. No respiratory distress. She has no wheezes. She has no rales. She exhibits no tenderness.  Musculoskeletal: Normal range of motion.  Neurological: She is alert and oriented to person, place, and time.  Skin: Skin is warm, dry and intact. Capillary refill takes less than 2 seconds. No rash noted. She is not diaphoretic. No erythema. No pallor.  Psychiatric: She has a normal mood and affect. Her speech is normal and behavior is normal. Judgment and thought content normal. Cognition and memory are normal.  Nursing note and vitals reviewed.   Results for orders placed or performed in visit on 04/26/18  CBC with Differential/Platelet  Result Value Ref Range   WBC 4.6 3.4 - 10.8 x10E3/uL   RBC 4.71 3.77 - 5.28 x10E6/uL   Hemoglobin 13.8 11.1 - 15.9 g/dL   Hematocrit 42.9 34.0 - 46.6 %   MCV 91 79 - 97 fL   MCH 29.3 26.6 - 33.0 pg   MCHC 32.2 31.5 - 35.7 g/dL   RDW 13.9 12.3 - 15.4 %    Platelets 236 150 - 450 x10E3/uL   Neutrophils 59 Not Estab. %   Lymphs 22 Not Estab. %   Monocytes 11 Not Estab. %   Eos 7 Not Estab. %   Basos 1 Not Estab. %   Neutrophils Absolute 2.7 1.4 - 7.0 x10E3/uL   Lymphocytes Absolute 1.0 0.7 - 3.1 x10E3/uL   Monocytes Absolute 0.5 0.1 - 0.9 x10E3/uL   EOS (ABSOLUTE) 0.3 0.0 - 0.4 x10E3/uL   Basophils Absolute 0.0 0.0 - 0.2 x10E3/uL   Immature Granulocytes 0 Not Estab. %   Immature Grans (Abs) 0.0 0.0 - 0.1 x10E3/uL  Iron and TIBC  Result Value Ref Range   Total Iron Binding Capacity 272 250 - 450 ug/dL   UIBC 188 118 - 369 ug/dL   Iron 84 27 - 139 ug/dL   Iron Saturation 31 15 - 55 %  Ferritin  Result Value Ref Range   Ferritin 58 15 - 150 ng/mL      Assessment & Plan:   Problem List Items Addressed This Visit      Other   Binge eating disorder - Primary    Stable on current regimen. Recheck 4 months- 4 month supply given today as she is due for a physical in 4 months rather than 3. Call with any concerns.      Relevant Medications   lisdexamfetamine (VYVANSE) 60 MG capsule (Start on 09/29/2018)    Other Visit Diagnoses    Situational anxiety       Will continue to monitor, if getting worse or would like something, will let us know.    Immunization due       Flu shot given today.   Relevant Orders   Flu vaccine HIGH DOSE PF (Fluzone High dose) (Completed)       Follow up plan: Return As scheduled.

## 2018-07-31 NOTE — Assessment & Plan Note (Signed)
Stable on current regimen. Recheck 4 months- 4 month supply given today as she is due for a physical in 4 months rather than 3. Call with any concerns.

## 2018-09-24 ENCOUNTER — Encounter: Payer: Self-pay | Admitting: Family Medicine

## 2018-09-24 MED ORDER — FAMOTIDINE 20 MG PO TABS: 20.0000 mg | ORAL_TABLET | Freq: Two times a day (BID) | ORAL | 1 refills | Status: DC

## 2018-10-05 ENCOUNTER — Encounter: Payer: Self-pay | Admitting: Family Medicine

## 2018-10-06 ENCOUNTER — Encounter: Payer: Self-pay | Admitting: Family Medicine

## 2018-10-08 ENCOUNTER — Encounter: Payer: Self-pay | Admitting: Family Medicine

## 2018-10-08 ENCOUNTER — Ambulatory Visit (INDEPENDENT_AMBULATORY_CARE_PROVIDER_SITE_OTHER): Payer: PPO | Admitting: Family Medicine

## 2018-10-08 VITALS — BP 160/96 | HR 70 | Temp 98.1°F | Wt 213.0 lb

## 2018-10-08 DIAGNOSIS — R05 Cough: Secondary | ICD-10-CM | POA: Diagnosis not present

## 2018-10-08 DIAGNOSIS — R059 Cough, unspecified: Secondary | ICD-10-CM

## 2018-10-08 DIAGNOSIS — J440 Chronic obstructive pulmonary disease with acute lower respiratory infection: Secondary | ICD-10-CM | POA: Diagnosis not present

## 2018-10-08 DIAGNOSIS — J209 Acute bronchitis, unspecified: Secondary | ICD-10-CM | POA: Diagnosis not present

## 2018-10-08 MED ORDER — UMECLIDINIUM-VILANTEROL 62.5-25 MCG/INH IN AEPB: 1.0000 | INHALATION_SPRAY | Freq: Every day | RESPIRATORY_TRACT | 3 refills | Status: DC

## 2018-10-08 MED ORDER — ALBUTEROL SULFATE (2.5 MG/3ML) 0.083% IN NEBU
2.5000 mg | INHALATION_SOLUTION | Freq: Once | RESPIRATORY_TRACT | Status: AC
  Administered 2018-10-08: 2.5 mg via RESPIRATORY_TRACT

## 2018-10-08 MED ORDER — ALBUTEROL SULFATE HFA 108 (90 BASE) MCG/ACT IN AERS: 2.0000 | INHALATION_SPRAY | Freq: Four times a day (QID) | RESPIRATORY_TRACT | 3 refills | Status: DC | PRN

## 2018-10-08 MED ORDER — PREDNISONE 10 MG PO TABS: ORAL_TABLET | ORAL | 0 refills | Status: DC

## 2018-10-08 MED ORDER — AZITHROMYCIN 250 MG PO TABS: ORAL_TABLET | ORAL | 0 refills | Status: DC

## 2018-10-08 NOTE — Progress Notes (Signed)
BP (!) 160/96   Pulse 70   Temp 98.1 F (36.7 C) (Oral)   Wt 213 lb (96.6 kg)   SpO2 98%   BMI 30.56 kg/m    Subjective:    Patient ID: Mckenzie Webster, female    DOB: 07/16/1951, 67 y.o.   MRN: [id number]  HPI: Mckenzie Webster is a 67 y.o. female  Chief Complaint  Patient presents with  . Cough    Productive. Was dry cough for a long time before turning productive. Ongoing .1.5 - 2 montsh.    COUGH Duration: 1.5-2 months Circumstances of initial development of cough: unknown Cough severity: moderate Cough description: was non-productive, now productive Aggravating factors:  Laying down Alleviating factors: nothing Status:  worse Treatments attempted: nothing Wheezing: yes Shortness of breath: yes Chest pain: no Chest tightness:no Nasal congestion: no Runny nose: no Postnasal drip: no Frequent throat clearing or swallowing: no Hemoptysis: no Fevers: no Night sweats: no Weight loss: no Heartburn: no Recent foreign travel: no Tuberculosis contacts: no  Relevant past medical, surgical, family and social history reviewed and updated as indicated. Interim medical history since our last visit reviewed. Allergies and medications reviewed and updated.  Review of Systems  Constitutional: Negative.   HENT: Negative.   Respiratory: Positive for cough, shortness of breath and wheezing. Negative for apnea, choking, chest tightness and stridor.   Cardiovascular: Negative.   Musculoskeletal: Negative.   Neurological: Negative.   Psychiatric/Behavioral: Negative.     Per HPI unless specifically indicated above     Objective:    BP (!) 160/96   Pulse 70   Temp 98.1 F (36.7 C) (Oral)   Wt 213 lb (96.6 kg)   SpO2 98%   BMI 30.56 kg/m   Wt Readings from Last 3 Encounters:  10/08/18 213 lb (96.6 kg)  07/31/18 213 lb 6 oz (96.8 kg)  04/26/18 208 lb (94.3 kg)    Physical Exam  Constitutional: She is oriented to person, place, and time. She appears  well-developed and well-nourished. No distress.  HENT:  Head: Normocephalic and atraumatic.  Right Ear: Hearing normal.  Left Ear: Hearing normal.  Nose: Nose normal.  Eyes: Conjunctivae and lids are normal. Right eye exhibits no discharge. Left eye exhibits no discharge. No scleral icterus.  Cardiovascular: Normal rate, regular rhythm, normal heart sounds and intact distal pulses. Exam reveals no gallop and no friction rub.  No murmur heard. Pulmonary/Chest: Effort normal. No stridor. No respiratory distress. She has wheezes. She has no rales. She exhibits no tenderness.  Musculoskeletal: Normal range of motion.  Neurological: She is alert and oriented to person, place, and time.  Skin: Skin is warm, dry and intact. Capillary refill takes less than 2 seconds. No rash noted. She is not diaphoretic. No erythema. No pallor.  Psychiatric: She has a normal mood and affect. Her speech is normal and behavior is normal. Judgment and thought content normal. Cognition and memory are normal.    Results for orders placed or performed in visit on 04/26/18  CBC with Differential/Platelet  Result Value Ref Range   WBC 4.6 3.4 - 10.8 x10E3/uL   RBC 4.71 3.77 - 5.28 x10E6/uL   Hemoglobin 13.8 11.1 - 15.9 g/dL   Hematocrit 42.9 34.0 - 46.6 %   MCV 91 79 - 97 fL   MCH 29.3 26.6 - 33.0 pg   MCHC 32.2 31.5 - 35.7 g/dL   RDW 13.9 12.3 - 15.4 %   Platelets 236 150 - 450  x10E3/uL   Neutrophils 59 Not Estab. %   Lymphs 22 Not Estab. %   Monocytes 11 Not Estab. %   Eos 7 Not Estab. %   Basos 1 Not Estab. %   Neutrophils Absolute 2.7 1.4 - 7.0 x10E3/uL   Lymphocytes Absolute 1.0 0.7 - 3.1 x10E3/uL   Monocytes Absolute 0.5 0.1 - 0.9 x10E3/uL   EOS (ABSOLUTE) 0.3 0.0 - 0.4 x10E3/uL   Basophils Absolute 0.0 0.0 - 0.2 x10E3/uL   Immature Granulocytes 0 Not Estab. %   Immature Grans (Abs) 0.0 0.0 - 0.1 x10E3/uL  Iron and TIBC  Result Value Ref Range   Total Iron Binding Capacity 272 250 - 450 ug/dL    UIBC 188 118 - 369 ug/dL   Iron 84 27 - 139 ug/dL   Iron Saturation 31 15 - 55 %  Ferritin  Result Value Ref Range   Ferritin 58 15 - 150 ng/mL      Assessment & Plan:   Problem List Items Addressed This Visit      Respiratory   Acute bronchitis with COPD ([place]) - Primary    Will treat with azithromycin and prednisone. Start anoro and albuterol. Call with any concerns or not getting better.       Relevant Medications   umeclidinium-vilanterol (ANORO ELLIPTA) 62.5-25 MCG/INH AEPB   albuterol (PROVENTIL HFA;VENTOLIN HFA) 108 (90 Base) MCG/ACT inhaler   albuterol (PROVENTIL) (2.5 MG/3ML) 0.083% nebulizer solution 2.5 mg (Completed)   predniSONE (DELTASONE) 10 MG tablet   azithromycin (ZITHROMAX) 250 MG tablet    Other Visit Diagnoses    Cough       Better following neb. Will treat with anoro and albuterol. Recheck at follow up.   Relevant Medications   albuterol (PROVENTIL) (2.5 MG/3ML) 0.083% nebulizer solution 2.5 mg (Completed)   Other Relevant Orders   Spirometry with graph       Follow up plan: Return As scheduled.

## 2018-10-08 NOTE — Assessment & Plan Note (Signed)
Will treat with azithromycin and prednisone. Start anoro and albuterol. Call with any concerns or not getting better.

## 2018-10-14 ENCOUNTER — Encounter: Payer: Self-pay | Admitting: Family Medicine

## 2018-10-14 ENCOUNTER — Ambulatory Visit (INDEPENDENT_AMBULATORY_CARE_PROVIDER_SITE_OTHER): Payer: PPO | Admitting: Family Medicine

## 2018-10-14 ENCOUNTER — Ambulatory Visit
Admission: RE | Admit: 2018-10-14 | Discharge: 2018-10-14 | Disposition: A | Discharge status: Home / Self Care | Payer: PPO | Source: Ambulatory Visit | Attending: Family Medicine | Admitting: Family Medicine

## 2018-10-14 VITALS — BP 138/90 | HR 73 | Temp 97.8°F | Wt 213.8 lb

## 2018-10-14 DIAGNOSIS — J440 Chronic obstructive pulmonary disease with acute lower respiratory infection: Secondary | ICD-10-CM

## 2018-10-14 DIAGNOSIS — J209 Acute bronchitis, unspecified: Secondary | ICD-10-CM | POA: Diagnosis not present

## 2018-10-14 DIAGNOSIS — R05 Cough: Secondary | ICD-10-CM | POA: Diagnosis not present

## 2018-10-14 MED ORDER — DOXYCYCLINE HYCLATE 100 MG PO TABS: 100.0000 mg | ORAL_TABLET | Freq: Two times a day (BID) | ORAL | 0 refills | Status: DC

## 2018-10-14 MED ORDER — ALBUTEROL SULFATE (2.5 MG/3ML) 0.083% IN NEBU
2.5000 mg | INHALATION_SOLUTION | Freq: Once | RESPIRATORY_TRACT | Status: AC
  Administered 2018-10-14: 2.5 mg via RESPIRATORY_TRACT

## 2018-10-14 MED ORDER — PREDNISONE 10 MG PO TABS: ORAL_TABLET | ORAL | 0 refills | Status: DC

## 2018-10-14 MED ORDER — BUDESONIDE-FORMOTEROL FUMARATE 160-4.5 MCG/ACT IN AERO: 2.0000 | INHALATION_SPRAY | Freq: Two times a day (BID) | RESPIRATORY_TRACT | 3 refills | Status: DC

## 2018-10-14 MED ORDER — ALBUTEROL SULFATE HFA 108 (90 BASE) MCG/ACT IN AERS: 2.0000 | INHALATION_SPRAY | Freq: Four times a day (QID) | RESPIRATORY_TRACT | 3 refills | Status: DC | PRN

## 2018-10-14 NOTE — Assessment & Plan Note (Signed)
Worse. Will treat with 12 day taper of prednisone and doxycycline. Start albuterol and symbicort. Recheck lungs 1 week. Call with any concerns.

## 2018-10-14 NOTE — Progress Notes (Signed)
BP 138/90   Pulse 73   Temp 97.8 F (36.6 C) (Oral)   Wt 213 lb 12.8 oz (97 kg)   SpO2 97%   BMI 30.68 kg/m    Subjective:    Patient ID: Mckenzie Webster, female    DOB: 1951-09-06, 67 y.o.   MRN: [id number]  HPI: Mckenzie Webster is a 67 y.o. female  Chief Complaint  Patient presents with  . URI    pt states she is not feeling any better from last week's visit, states she could not afford the inhalers sent in as well    Mckenzie Webster was seen 6 days ago for bronchitis. Treated with prednisone and azithromycin- finished yesterday. Had been sick for 1.5-2 months prior to that. Had a spriometry last visit which showed obstruction. Given a sample of anoro and an Rx for albuterol. Could not afford them. She notes that sometimes she feels like she is getting worse, and sometimes she feels like she is getting better, so she was concerned and called to be seen. She has been coughing. No fever. No chills. + SOB, + wheezing, No congestion, no chest pain. Worse with laying down. Runny nose: no, Postnasal drip: no, Frequent throat clearing or swallowing: no, Hemoptysis: no, Fevers: no, Night sweats: no, Weight loss: no, Heartburn: no, Recent foreign travel: no, Tuberculosis contacts: no, No other concerns or complaints at this time.   Relevant past medical, surgical, family and social history reviewed and updated as indicated. Interim medical history since our last visit reviewed. Allergies and medications reviewed and updated.  Review of Systems  Constitutional: Positive for fatigue. Negative for activity change, appetite change, chills, diaphoresis, fever and unexpected weight change.  HENT: Negative.   Respiratory: Positive for cough, shortness of breath and wheezing. Negative for apnea, choking, chest tightness and stridor.   Cardiovascular: Negative.   Psychiatric/Behavioral: Negative.     Per HPI unless specifically indicated above     Objective:    BP 138/90   Pulse 73   Temp 97.8 F  (36.6 C) (Oral)   Wt 213 lb 12.8 oz (97 kg)   SpO2 97%   BMI 30.68 kg/m   Wt Readings from Last 3 Encounters:  10/14/18 213 lb 12.8 oz (97 kg)  10/08/18 213 lb (96.6 kg)  07/31/18 213 lb 6 oz (96.8 kg)    Physical Exam Vitals signs and nursing note reviewed.  Constitutional:      General: She is not in acute distress.    Appearance: Normal appearance. She is not ill-appearing, toxic-appearing or diaphoretic.  HENT:     Head: Normocephalic and atraumatic.     Right Ear: External ear normal.     Left Ear: External ear normal.     Nose: Nose normal.     Mouth/Throat:     Mouth: Mucous membranes are moist.     Pharynx: Oropharynx is clear.  Eyes:     General: No scleral icterus.       Right eye: No discharge.        Left eye: No discharge.     Extraocular Movements: Extraocular movements intact.     Conjunctiva/sclera: Conjunctivae normal.     Pupils: Pupils are equal, round, and reactive to light.  Neck:     Musculoskeletal: Normal range of motion and neck supple.  Cardiovascular:     Rate and Rhythm: Normal rate and regular rhythm.     Pulses: Normal pulses.     Heart sounds: Normal  heart sounds. No murmur. No friction rub. No gallop.   Pulmonary:     Effort: Pulmonary effort is normal. No respiratory distress.     Breath sounds: No stridor. Wheezing and rhonchi present. No rales.  Chest:     Chest wall: No tenderness.  Musculoskeletal: Normal range of motion.  Skin:    General: Skin is warm and dry.     Capillary Refill: Capillary refill takes less than 2 seconds.     Coloration: Skin is not jaundiced or pale.     Findings: No bruising, erythema, lesion or rash.  Neurological:     General: No focal deficit present.     Mental Status: She is alert and oriented to person, place, and time. Mental status is at baseline.  Psychiatric:        Mood and Affect: Mood normal.        Behavior: Behavior normal.        Thought Content: Thought content normal.        Judgment:  Judgment normal.     Results for orders placed or performed in visit on 04/26/18  CBC with Differential/Platelet  Result Value Ref Range   WBC 4.6 3.4 - 10.8 x10E3/uL   RBC 4.71 3.77 - 5.28 x10E6/uL   Hemoglobin 13.8 11.1 - 15.9 g/dL   Hematocrit 42.9 34.0 - 46.6 %   MCV 91 79 - 97 fL   MCH 29.3 26.6 - 33.0 pg   MCHC 32.2 31.5 - 35.7 g/dL   RDW 13.9 12.3 - 15.4 %   Platelets 236 150 - 450 x10E3/uL   Neutrophils 59 Not Estab. %   Lymphs 22 Not Estab. %   Monocytes 11 Not Estab. %   Eos 7 Not Estab. %   Basos 1 Not Estab. %   Neutrophils Absolute 2.7 1.4 - 7.0 x10E3/uL   Lymphocytes Absolute 1.0 0.7 - 3.1 x10E3/uL   Monocytes Absolute 0.5 0.1 - 0.9 x10E3/uL   EOS (ABSOLUTE) 0.3 0.0 - 0.4 x10E3/uL   Basophils Absolute 0.0 0.0 - 0.2 x10E3/uL   Immature Granulocytes 0 Not Estab. %   Immature Grans (Abs) 0.0 0.0 - 0.1 x10E3/uL  Iron and TIBC  Result Value Ref Range   Total Iron Binding Capacity 272 250 - 450 ug/dL   UIBC 188 118 - 369 ug/dL   Iron 84 27 - 139 ug/dL   Iron Saturation 31 15 - 55 %  Ferritin  Result Value Ref Range   Ferritin 58 15 - 150 ng/mL      Assessment & Plan:   Problem List Items Addressed This Visit      Respiratory   Acute bronchitis with COPD ([place]) - Primary    Worse. Will treat with 12 day taper of prednisone and doxycycline. Start albuterol and symbicort. Recheck lungs 1 week. Call with any concerns.       Relevant Medications   predniSONE (DELTASONE) 10 MG tablet   budesonide-formoterol (SYMBICORT) 160-4.5 MCG/ACT inhaler   albuterol (PROVENTIL) (2.5 MG/3ML) 0.083% nebulizer solution 2.5 mg (Completed)   albuterol (PROVENTIL HFA;VENTOLIN HFA) 108 (90 Base) MCG/ACT inhaler    Other Visit Diagnoses    Acute bronchitis, unspecified organism       Relevant Medications   albuterol (PROVENTIL) (2.5 MG/3ML) 0.083% nebulizer solution 2.5 mg (Completed)   Other Relevant Orders   DG Chest 2 View       Follow up plan: Return in about 1 week  (around 10/21/2018) for lung recheck.

## 2018-10-21 ENCOUNTER — Ambulatory Visit (INDEPENDENT_AMBULATORY_CARE_PROVIDER_SITE_OTHER): Payer: PPO | Admitting: Family Medicine

## 2018-10-21 ENCOUNTER — Encounter: Payer: Self-pay | Admitting: Family Medicine

## 2018-10-21 VITALS — BP 154/88 | HR 73 | Temp 98.7°F

## 2018-10-21 DIAGNOSIS — J209 Acute bronchitis, unspecified: Secondary | ICD-10-CM | POA: Diagnosis not present

## 2018-10-21 DIAGNOSIS — J440 Chronic obstructive pulmonary disease with acute lower respiratory infection: Secondary | ICD-10-CM | POA: Diagnosis not present

## 2018-10-21 NOTE — Progress Notes (Signed)
BP (!) 154/88 (BP Location: Left Arm, Patient Position: Sitting, Cuff Size: Large)   Pulse 73   Temp 98.7 F (37.1 C)   SpO2 99%    Subjective:    Patient ID: Mckenzie Webster, female    DOB: [birth year], 67 y.o.   MRN: [id number]  HPI: Mckenzie Webster is a 67 y.o. female  Chief Complaint  Patient presents with  . Lung Recheck   Here today for bronchitis follow up. Still taking a long prednisone taper and doxycycline. Just taking the albuterol as needed. States she couldn't afford the anoro or symbicort and does not wish to be on them, especially daily. Feeling much better. No fevers, wheezing, chest tightness, cough lingering.   Relevant past medical, surgical, family and social history reviewed and updated as indicated. Interim medical history since our last visit reviewed. Allergies and medications reviewed and updated.  Review of Systems  Per HPI unless specifically indicated above     Objective:    BP (!) 154/88 (BP Location: Left Arm, Patient Position: Sitting, Cuff Size: Large)   Pulse 73   Temp 98.7 F (37.1 C)   SpO2 99%   Wt Readings from Last 3 Encounters:  10/14/18 213 lb 12.8 oz (97 kg)  10/08/18 213 lb (96.6 kg)  07/31/18 213 lb 6 oz (96.8 kg)    Physical Exam Vitals signs and nursing note reviewed.  Constitutional:      Appearance: Normal appearance. She is not ill-appearing.  HENT:     Head: Atraumatic.  Eyes:     Extraocular Movements: Extraocular movements intact.     Conjunctiva/sclera: Conjunctivae normal.  Neck:     Musculoskeletal: Normal range of motion and neck supple.  Cardiovascular:     Rate and Rhythm: Normal rate and regular rhythm.     Heart sounds: Normal heart sounds.  Pulmonary:     Effort: Pulmonary effort is normal.     Breath sounds: Normal breath sounds. No wheezing, rhonchi or rales.  Musculoskeletal: Normal range of motion.  Skin:    General: Skin is warm and dry.  Neurological:     Mental Status: She is alert and  oriented to person, place, and time.  Psychiatric:        Mood and Affect: Mood normal.        Thought Content: Thought content normal.        Judgment: Judgment normal.     Results for orders placed or performed in visit on 04/26/18  CBC with Differential/Platelet  Result Value Ref Range   WBC 4.6 3.4 - 10.8 x10E3/uL   RBC 4.71 3.77 - 5.28 x10E6/uL   Hemoglobin 13.8 11.1 - 15.9 g/dL   Hematocrit 42.9 34.0 - 46.6 %   MCV 91 79 - 97 fL   MCH 29.3 26.6 - 33.0 pg   MCHC 32.2 31.5 - 35.7 g/dL   RDW 13.9 12.3 - 15.4 %   Platelets 236 150 - 450 x10E3/uL   Neutrophils 59 Not Estab. %   Lymphs 22 Not Estab. %   Monocytes 11 Not Estab. %   Eos 7 Not Estab. %   Basos 1 Not Estab. %   Neutrophils Absolute 2.7 1.4 - 7.0 x10E3/uL   Lymphocytes Absolute 1.0 0.7 - 3.1 x10E3/uL   Monocytes Absolute 0.5 0.1 - 0.9 x10E3/uL   EOS (ABSOLUTE) 0.3 0.0 - 0.4 x10E3/uL   Basophils Absolute 0.0 0.0 - 0.2 x10E3/uL   Immature Granulocytes 0 Not Estab. %  Immature Grans (Abs) 0.0 0.0 - 0.1 x10E3/uL  Iron and TIBC  Result Value Ref Range   Total Iron Binding Capacity 272 250 - 450 ug/dL   UIBC 188 118 - 369 ug/dL   Iron 84 27 - 139 ug/dL   Iron Saturation 31 15 - 55 %  Ferritin  Result Value Ref Range   Ferritin 58 15 - 150 ng/mL      Assessment & Plan:   Problem List Items Addressed This Visit      Respiratory   Acute bronchitis with COPD ([place]) - Primary    Much improved on prednisone and doxycycline. Continue albuterol prn. Call if sxs return once prednisone course completed          Follow up plan: Return for as scheduled.

## 2018-10-21 NOTE — Assessment & Plan Note (Signed)
Much improved on prednisone and doxycycline. Continue albuterol prn. Call if sxs return once prednisone course completed

## 2018-12-04 ENCOUNTER — Ambulatory Visit (INDEPENDENT_AMBULATORY_CARE_PROVIDER_SITE_OTHER): Payer: PPO

## 2018-12-04 ENCOUNTER — Encounter: Payer: Self-pay | Admitting: Family Medicine

## 2018-12-04 ENCOUNTER — Ambulatory Visit (INDEPENDENT_AMBULATORY_CARE_PROVIDER_SITE_OTHER): Payer: PPO | Admitting: Family Medicine

## 2018-12-04 VITALS — BP 118/78 | HR 64 | Temp 98.1°F | Resp 16 | Ht 70.0 in | Wt 217.7 lb

## 2018-12-04 VITALS — BP 118/78 | HR 64 | Temp 98.1°F | Ht 70.0 in | Wt 217.4 lb

## 2018-12-04 DIAGNOSIS — R21 Rash and other nonspecific skin eruption: Secondary | ICD-10-CM | POA: Diagnosis not present

## 2018-12-04 DIAGNOSIS — D509 Iron deficiency anemia, unspecified: Secondary | ICD-10-CM

## 2018-12-04 DIAGNOSIS — Z0000 Encounter for general adult medical examination without abnormal findings: Secondary | ICD-10-CM

## 2018-12-04 DIAGNOSIS — F5081 Binge eating disorder: Secondary | ICD-10-CM

## 2018-12-04 DIAGNOSIS — M810 Age-related osteoporosis without current pathological fracture: Secondary | ICD-10-CM

## 2018-12-04 LAB — UA/M W/RFLX CULTURE, ROUTINE
Bilirubin, UA: NEGATIVE
Glucose, UA: NEGATIVE
Ketones, UA: NEGATIVE
LEUKOCYTES UA: NEGATIVE
NITRITE UA: NEGATIVE
Protein, UA: NEGATIVE
RBC, UA: NEGATIVE
Specific Gravity, UA: 1.025 (ref 1.005–1.030)
Urobilinogen, Ur: 0.2 mg/dL (ref 0.2–1.0)
pH, UA: 5 (ref 5.0–7.5)

## 2018-12-04 LAB — MICROALBUMIN, URINE WAIVED
Creatinine, Urine Waived: 100 mg/dL (ref 10–300)
Microalb, Ur Waived: 10 mg/L (ref 0–19)
Microalb/Creat Ratio: <30 mg/g (ref <30)

## 2018-12-04 MED ORDER — LISDEXAMFETAMINE DIMESYLATE 60 MG PO CAPS: 60.0000 mg | ORAL_CAPSULE | ORAL | 0 refills | Status: DC

## 2018-12-04 MED ORDER — CLOTRIMAZOLE-BETAMETHASONE 1-0.05 % EX CREA: 1.0000 "application " | TOPICAL_CREAM | Freq: Two times a day (BID) | CUTANEOUS | 2 refills | Status: DC

## 2018-12-04 NOTE — Progress Notes (Signed)
Subjective:   Mckenzie Webster is a 68 y.o. female who presents for Medicare Annual (Subsequent) preventive examination.  Review of Systems:  Cardiac Risk Factors include: advanced age (>78men, >49 women);hypertension;dyslipidemia;obesity (BMI >30kg/m2)     Objective:     Vitals: BP 118/78 (BP Location: Left Arm, Patient Position: Sitting, Cuff Size: Normal)   Pulse 64   Temp 98.1 F (36.7 C) (Temporal)   Resp 16   Ht 5\' 10"  (1.778 m)   Wt 217 lb 11.2 oz (98.7 kg)   BMI 31.24 kg/m   Body mass index is 31.24 kg/m.  Advanced Directives 12/04/2018 11/28/2017 04/27/2017 11/24/2016 04/07/2016 01/06/2016  Does Patient Have a Medical Advance Directive? No Yes No No No No  Does patient want to make changes to medical advance directive? - Yes (MAU/Ambulatory/Procedural Areas - Information given) - - - -  Would patient like information on creating a medical advance directive? Yes (MAU/Ambulatory/Procedural Areas - Information given) - - Yes (MAU/Ambulatory/Procedural Areas - Information given) - -  Pre-existing out of facility DNR order (yellow form or pink MOST form) - - Physician notified to receive inpatient order - - -    Tobacco Social History   Tobacco Use  Smoking Status Former Smoker  . Types: Cigarettes  . Last attempt to quit: 10/30/1989  . Years since quitting: 29.1  Smokeless Tobacco Never Used     Counseling given: Not Answered   Clinical Intake:  Pre-visit preparation completed: Yes  Pain : No/denies pain Pain Score: 0-No pain     Nutritional Status: BMI > 30  Obese Nutritional Risks: None Diabetes: No  How often do you need to have someone help you when you read instructions, pamphlets, or other written materials from your doctor or pharmacy?: 1 - Never What is the last grade level you completed in school?: some college   Interpreter Needed?: No  Information entered by :: Lan Mcneill,LPN   Past Medical History:  Diagnosis Date  . Anemia   . Cataract   .  Dry eye syndrome   . Stomach ulcer    Past Surgical History:  Procedure Laterality Date  . COLONOSCOPY WITH PROPOFOL N/A 04/27/2017   Procedure: COLONOSCOPY WITH PROPOFOL;  Surgeon: Jonathon Bellows, MD;  Location: [facility] ENDOSCOPY;  Service: Endoscopy;  Laterality: N/A;  . EYE SURGERY     Cataract  . FOOT SURGERY     Change the alignment of the toes to stop the formation of corns  . gastric bypass  1981   Family History  Problem Relation Age of Onset  . Heart disease Mother   . COPD Mother   . Heart disease Father   . Hypertension Father   . Heart disease Maternal Grandfather   . Alzheimer's disease Paternal Grandmother   . Breast cancer Neg Hx    Social History   Socioeconomic History  . Marital status: Divorced    Spouse name: Not on file  . Number of children: Not on file  . Years of education: Not on file  . Highest education level: Some college, no degree  Occupational History  . Not on file  Social Needs  . Financial resource strain: Not hard at all  . Food insecurity:    Worry: Never true    Inability: Never true  . Transportation needs:    Medical: No    Non-medical: No  Tobacco Use  . Smoking status: Former Smoker    Types: Cigarettes    Last attempt to quit:  10/30/1989    Years since quitting: 29.1  . Smokeless tobacco: Never Used  Substance and Sexual Activity  . Alcohol use: Yes    Comment: on occasion  . Drug use: No  . Sexual activity: Not Currently  Lifestyle  . Physical activity:    Days per week: 0 days    Minutes per session: 0 min  . Stress: Not at all  Relationships  . Social connections:    Talks on phone: More than three times a week    Gets together: More than three times a week    Attends religious service: More than 4 times per year    Active member of club or organization: No    Attends meetings of clubs or organizations: Never    Relationship status: Divorced  Other Topics Concern  . Not on file  Social History Narrative  . Not on  file    Outpatient Encounter Medications as of 12/04/2018  Medication Sig  . alendronate (FOSAMAX) 70 MG tablet TAKE 1 TABLET BY MOUTH ONCE A WEEK.TAKE WITH FULL GLASS OR WATER ON EMPTY STOMACH  . famotidine (PEPCID) 20 MG tablet Take 1 tablet (20 mg total) by mouth 2 (two) times daily.  . ferrous sulfate 325 (65 FE) MG EC tablet Take 1 tablet (325 mg total) by mouth daily.  [name] Kitchen GLUCOSAMINE-CHONDROITIN PO Take by mouth.  . lisdexamfetamine (VYVANSE) 60 MG capsule Take 1 capsule (60 mg total) by mouth every morning.  . naproxen (NAPROXEN DR) 500 MG EC tablet Take 1 tablet (500 mg total) by mouth 2 (two) times daily with a meal.  . albuterol (PROVENTIL HFA;VENTOLIN HFA) 108 (90 Base) MCG/ACT inhaler Inhale 2 puffs into the lungs every 6 (six) hours as needed for wheezing or shortness of breath. (Patient not taking: Reported on 12/04/2018)  . budesonide-formoterol (SYMBICORT) 160-4.5 MCG/ACT inhaler Inhale 2 puffs into the lungs 2 (two) times daily. (Patient not taking: Reported on 12/04/2018)  . doxycycline (VIBRA-TABS) 100 MG tablet Take 1 tablet (100 mg total) by mouth 2 (two) times daily. (Patient not taking: Reported on 12/04/2018)  . predniSONE (DELTASONE) 10 MG tablet 6 tabs Tuesday and Wednesday, 5 days for the next 2 days, decrease by 1 every other day until gone (Patient not taking: Reported on 12/04/2018)  . [DISCONTINUED] lisdexamfetamine (VYVANSE) 60 MG capsule Take 1 capsule (60 mg total) by mouth daily.   No facility-administered encounter medications on file as of 12/04/2018.     Activities of Daily Living In your present state of health, do you have any difficulty performing the following activities: 12/04/2018  Hearing? N  Vision? N  Comment reading glasses   Difficulty concentrating or making decisions? N  Walking or climbing stairs? N  Dressing or bathing? N  Doing errands, shopping? N  Preparing Food and eating ? N  Using the Toilet? N  In the past six months, have you accidently  leaked urine? N  Do you have problems with loss of bowel control? N  Managing your Medications? N  Managing your Finances? N  Housekeeping or managing your Housekeeping? N  Some recent data might be hidden    Patient Care Team: Valerie Roys, DO as PCP - General (Family Medicine) Conception Chancy, DDS (Dentistry)    Assessment:   This is a routine wellness examination for [place].  Exercise Activities and Dietary recommendations Current Exercise Habits: The patient does not participate in regular exercise at present, Exercise limited by: None identified  Goals    .  DIET - INCREASE WATER INTAKE     Recommend drinking at least 6-8 glasses of water a day        Fall Risk Fall Risk  12/04/2018 11/28/2017 11/28/2017 11/24/2016 10/13/2016  Falls in the past year? 0 No No No No  Number falls in past yr: 0 - - - -   FALL RISK PREVENTION PERTAINING TO THE HOME:  Any stairs in or around the home WITH handrails? Yes, all stairs have hand rails  Home free of loose throw rugs in walkways, pet beds, electrical cords, etc? Yes  Adequate lighting in your home to reduce risk of falls? Yes   ASSISTIVE DEVICES UTILIZED TO PREVENT FALLS:  Life alert? No  Use of a cane, walker or w/c? No  Grab bars in the bathroom? No  Shower chair or bench in shower? No  Elevated toilet seat or a handicapped toilet? No   DME ORDERS:  DME order needed?  No   TIMED UP AND GO:  Was the test performed? Yes .  Length of time to ambulate 10 feet: 12 sec.   GAIT:  Appearance of gait: Gait stead-fast without the use of an assistive device.  Education: Fall risk prevention has been discussed.  Intervention(s) required? No    Depression Screen PHQ 2/9 Scores 12/04/2018 07/31/2018 11/28/2017 11/28/2017  PHQ - 2 Score 0 4 0 0  PHQ- 9 Score - 11 3 3      Cognitive Function     6CIT Screen 12/04/2018 11/28/2017 11/24/2016  What Year? 0 points 0 points 0 points  What month? 0 points 0 points 0 points  What time? 0  points 0 points 0 points  Count back from 20 0 points 0 points 0 points  Months in reverse 0 points 0 points 0 points  Repeat phrase 0 points 0 points 0 points  Total Score 0 0 0    Immunization History  Administered Date(s) Administered  . Influenza, High Dose Seasonal PF 08/17/2017, 07/31/2018  . Influenza,inj,Quad PF,6+ Mos 11/24/2016  . Pneumococcal Conjugate-13 11/24/2016  . Pneumococcal Polysaccharide-23 11/28/2017  . Tdap 02/16/2016  . Zoster 02/14/2017  . Zoster Recombinat (Shingrix) 02/14/2017, 08/17/2017    Qualifies for Shingles Vaccine? Completed series   Tdap: up to date   Flu Vaccine: up to date   Pneumococcal Vaccine: up to date   Screening Tests Health Maintenance  Topic Date Due  . MAMMOGRAM  01/11/2019  . COLONOSCOPY  04/27/2020  . TETANUS/TDAP  02/15/2026  . INFLUENZA VACCINE  Completed  . DEXA SCAN  Completed  . Hepatitis C Screening  Completed  . PNA vac Low Risk Adult  Completed    Cancer Screenings:  Colorectal Screening: Completed 04/27/2017. Repeat every 3 years;   Mammogram: Completed 01/10/2017. Vonzella Nipple for March. Pt provided with contact info and advised to call to schedule appt.   Bone Density: Completed 01/10/2017   Lung Cancer Screening: (Low Dose CT Chest recommended if Age 48-80 years, 30 pack-year currently smoking OR have quit w/in 15years.) does not qualify.     Additional Screening:  Hepatitis C Screening: Completed 11/24/2016  Vision Screening: Recommended annual ophthalmology exams for early detection of glaucoma and other disorders of the eye. Is the patient up to date with their annual eye exam?  No  Who is the provider or what is the name of the office in which the pt attends annual eye exams? Dr.Woodard   Dental Screening: Recommended annual dental exams for proper oral hygiene  Community Resource Referral:  CRR required this visit?  No      Plan:    I have personally reviewed and addressed the Medicare Annual  Wellness questionnaire and have noted the following in the patient's chart:  A. Medical and social history B. Use of alcohol, tobacco or illicit drugs  C. Current medications and supplements D. Functional ability and status E.  Nutritional status F.  Physical activity G. Advance directives H. List of other physicians I.  Hospitalizations, surgeries, and ER visits in previous 12 months J.  [place] such as hearing and vision if needed, cognitive and depression L. Referrals and appointments   In addition, I have reviewed and discussed with patient certain preventive protocols, quality metrics, and best practice recommendations. A written personalized care plan for preventive services as well as general preventive health recommendations were provided to patient.   Signed,  Tyler Aas, LPN Nurse Health Advisor   Nurse Notes:none

## 2018-12-04 NOTE — Assessment & Plan Note (Signed)
Due for recheck on DEXA. Ordered today. Call with any concerns.

## 2018-12-04 NOTE — Assessment & Plan Note (Signed)
Stable on current regimen. Refills for 3 months given today. Call with any concerns.

## 2018-12-04 NOTE — Patient Instructions (Signed)
Mckenzie Webster , Thank you for taking time to come for your Medicare Wellness Visit. I appreciate your ongoing commitment to your health goals. Please review the following plan we discussed and let me know if I can assist you in the future.   Screening recommendations/referrals: Colonoscopy: completed 04/27/2017 Mammogram: completed 01/12/2017 Please call [phone] to schedule your mammogram.  Bone Density: completed 01/10/2017, call to schedule with mammogram Recommended yearly ophthalmology/optometry visit for glaucoma screening and checkup Recommended yearly dental visit for hygiene and checkup  Vaccinations: Influenza vaccine: up to date Pneumococcal vaccine: up to date Tdap vaccine: up to date Shingles vaccine: shingrix completed 02/14/2017, 08/17/2017  Advanced directives: Advance directive discussed with you today. I have provided a copy for you to complete at home and have notarized. Once this is complete please bring a copy in to our office so we can scan it into your chart.  Conditions/risks identified: none   Next appointment: Follow up in one year for your annual wellness exam.    Preventive Care 65 Years and Older, Female Preventive care refers to lifestyle choices and visits with your health care provider that can promote health and wellness. What does preventive care include?  A yearly physical exam. This is also called an annual well check.  Dental exams once or twice a year.  Routine eye exams. Ask your health care provider how often you should have your eyes checked.  Personal lifestyle choices, including:  Daily care of your teeth and gums.  Regular physical activity.  Eating a healthy diet.  Avoiding tobacco and drug use.  Limiting alcohol use.  Practicing safe sex.  Taking low-dose aspirin every day.  Taking vitamin and mineral supplements as recommended by your health care provider. What happens during an annual well check? The services and  screenings done by your health care provider during your annual well check will depend on your age, overall health, lifestyle risk factors, and family history of disease. Counseling  Your health care provider may ask you questions about your:  Alcohol use.  Tobacco use.  Drug use.  Emotional well-being.  Home and relationship well-being.  Sexual activity.  Eating habits.  History of falls.  Memory and ability to understand (cognition).  Work and work Statistician.  Reproductive health. Screening  You may have the following tests or measurements:  Height, weight, and BMI.  Blood pressure.  Lipid and cholesterol levels. These may be checked every 5 years, or more frequently if you are over 49 years old.  Skin check.  Lung cancer screening. You may have this screening every year starting at age 40 if you have a 30-pack-year history of smoking and currently smoke or have quit within the past 15 years.  Fecal occult blood test (FOBT) of the stool. You may have this test every year starting at age 61.  Flexible sigmoidoscopy or colonoscopy. You may have a sigmoidoscopy every 5 years or a colonoscopy every 10 years starting at age 53.  Hepatitis C blood test.  Hepatitis B blood test.  Sexually transmitted disease (STD) testing.  Diabetes screening. This is done by checking your blood sugar (glucose) after you have not eaten for a while (fasting). You may have this done every 1-3 years.  Bone density scan. This is done to screen for osteoporosis. You may have this done starting at age 83.  Mammogram. This may be done every 1-2 years. Talk to your health care provider about how often you should have regular mammograms. Talk with  your health care provider about your test results, treatment options, and if necessary, the need for more tests. Vaccines  Your health care provider may recommend certain vaccines, such as:  Influenza vaccine. This is recommended every  year.  Tetanus, diphtheria, and acellular pertussis (Tdap, Td) vaccine. You may need a Td booster every 10 years.  Zoster vaccine. You may need this after age 4.  Pneumococcal 13-valent conjugate (PCV13) vaccine. One dose is recommended after age 94.  Pneumococcal polysaccharide (PPSV23) vaccine. One dose is recommended after age 1. Talk to your health care provider about which screenings and vaccines you need and how often you need them. This information is not intended to replace advice given to you by your health care provider. Make sure you discuss any questions you have with your health care provider. Document Released: 11/12/2015 Document Revised: 07/05/2016 Document Reviewed: 08/17/2015 Elsevier Interactive Patient Education  2017 [place] Prevention in the Home Falls can cause injuries. They can happen to people of all ages. There are many things you can do to make your home safe and to help prevent falls. What can I do on the outside of my home?  Regularly fix the edges of walkways and driveways and fix any cracks.  Remove anything that might make you trip as you walk through a door, such as a raised step or threshold.  Trim any bushes or trees on the path to your home.  Use bright outdoor lighting.  Clear any walking paths of anything that might make someone trip, such as rocks or tools.  Regularly check to see if handrails are loose or broken. Make sure that both sides of any steps have handrails.  Any raised decks and porches should have guardrails on the edges.  Have any leaves, snow, or ice cleared regularly.  Use sand or salt on walking paths during winter.  Clean up any spills in your garage right away. This includes oil or grease spills. What can I do in the bathroom?  Use night lights.  Install grab bars by the toilet and in the tub and shower. Do not use towel bars as grab bars.  Use non-skid mats or decals in the tub or shower.  If you  need to sit down in the shower, use a plastic, non-slip stool.  Keep the floor dry. Clean up any water that spills on the floor as soon as it happens.  Remove soap buildup in the tub or shower regularly.  Attach bath mats securely with double-sided non-slip rug tape.  Do not have throw rugs and other things on the floor that can make you trip. What can I do in the bedroom?  Use night lights.  Make sure that you have a light by your bed that is easy to reach.  Do not use any sheets or blankets that are too big for your bed. They should not hang down onto the floor.  Have a firm chair that has side arms. You can use this for support while you get dressed.  Do not have throw rugs and other things on the floor that can make you trip. What can I do in the kitchen?  Clean up any spills right away.  Avoid walking on wet floors.  Keep items that you use a lot in easy-to-reach places.  If you need to reach something above you, use a strong step stool that has a grab bar.  Keep electrical cords out of the way.  Do not  use floor polish or wax that makes floors slippery. If you must use wax, use non-skid floor wax.  Do not have throw rugs and other things on the floor that can make you trip. What can I do with my stairs?  Do not leave any items on the stairs.  Make sure that there are handrails on both sides of the stairs and use them. Fix handrails that are broken or loose. Make sure that handrails are as long as the stairways.  Check any carpeting to make sure that it is firmly attached to the stairs. Fix any carpet that is loose or worn.  Avoid having throw rugs at the top or bottom of the stairs. If you do have throw rugs, attach them to the floor with carpet tape.  Make sure that you have a light switch at the top of the stairs and the bottom of the stairs. If you do not have them, ask someone to add them for you. What else can I do to help prevent falls?  Wear shoes  that:  Do not have high heels.  Have rubber bottoms.  Are comfortable and fit you well.  Are closed at the toe. Do not wear sandals.  If you use a stepladder:  Make sure that it is fully opened. Do not climb a closed stepladder.  Make sure that both sides of the stepladder are locked into place.  Ask someone to hold it for you, if possible.  Clearly mark and make sure that you can see:  Any grab bars or handrails.  First and last steps.  Where the edge of each step is.  Use tools that help you move around (mobility aids) if they are needed. These include:  Canes.  Walkers.  Scooters.  Crutches.  Turn on the lights when you go into a dark area. Replace any light bulbs as soon as they burn out.  Set up your furniture so you have a clear path. Avoid moving your furniture around.  If any of your floors are uneven, fix them.  If there are any pets around you, be aware of where they are.  Review your medicines with your doctor. Some medicines can make you feel dizzy. This can increase your chance of falling. Ask your doctor what other things that you can do to help prevent falls. This information is not intended to replace advice given to you by your health care provider. Make sure you discuss any questions you have with your health care provider. Document Released: 08/12/2009 Document Revised: 03/23/2016 Document Reviewed: 11/20/2014 Elsevier Interactive Patient Education  2017 [employer].

## 2018-12-04 NOTE — Progress Notes (Signed)
BP 118/78 (BP Location: Left Arm, Patient Position: Sitting, Cuff Size: Normal)   Pulse 64   Temp 98.1 F (36.7 C)   Ht 5\' 10"  (1.778 m)   Wt 217 lb 7 oz (98.6 kg)   BMI 31.20 kg/m    Subjective:    Patient ID: Mckenzie Webster, female    DOB: 05/14/1951, 68 y.o.   MRN: [id number]  HPI: Mckenzie Webster is a 68 y.o. female presenting on 12/04/2018 for comprehensive medical examination. Current medical complaints include:  Continues to do well with the vyvanse. Continues to help with the cravings. She feels like she tolerates it well. No side effects. Weight remains stable. She does feel like it helps. No other concerns or complaints at this time.   ANEMIA Anemia status: controlled Etiology of anemia: iron deficiency Duration of anemia treatment: chronic Compliance with treatment: excellent compliance Iron supplementation side effects: no Severity of anemia: moderate Fatigue: no Decreased exercise tolerance: no  Dyspnea on exertion: no Palpitations: no Bleeding: no Pica: no  Menopausal Symptoms: no  Depression Screen done today and results listed below:  Depression screen [facility] 2/9 12/04/2018 12/04/2018 07/31/2018 11/28/2017 11/28/2017  Decreased Interest 0 0 2 0 0  Down, Depressed, Hopeless 2 0 2 0 0  PHQ - 2 Score 2 0 4 0 0  Altered sleeping 1 - 1 1 1   Tired, decreased energy 1 - 1 0 -  Change in appetite 1 - 1 1 1   Feeling bad or failure about yourself  1 - 3 1 1   Trouble concentrating 0 - 0 0 0  Moving slowly or fidgety/restless 0 - 0 0 0  Suicidal thoughts 0 - 1 0 0  PHQ-9 Score 6 - 11 3 3   Difficult doing work/chores Somewhat difficult - Somewhat difficult - -    Past Medical History:  Past Medical History:  Diagnosis Date  . Anemia   . Cataract   . Dry eye syndrome   . Positive colorectal cancer screening using Cologuard test 02/14/2017  . Stomach ulcer     Surgical History:  Past Surgical History:  Procedure Laterality Date  . COLONOSCOPY WITH PROPOFOL N/A  04/27/2017   Procedure: COLONOSCOPY WITH PROPOFOL;  Surgeon: Jonathon Bellows, MD;  Location: [facility] ENDOSCOPY;  Service: Endoscopy;  Laterality: N/A;  . EYE SURGERY     Cataract  . FOOT SURGERY     Change the alignment of the toes to stop the formation of corns  . gastric bypass  1981    Medications:  Current Outpatient Medications on File Prior to Visit  Medication Sig  . albuterol (PROVENTIL HFA;VENTOLIN HFA) 108 (90 Base) MCG/ACT inhaler Inhale 2 puffs into the lungs every 6 (six) hours as needed for wheezing or shortness of breath. (Patient not taking: Reported on 12/04/2018)  . alendronate (FOSAMAX) 70 MG tablet TAKE 1 TABLET BY MOUTH ONCE A WEEK.TAKE WITH FULL GLASS OR WATER ON EMPTY STOMACH  . budesonide-formoterol (SYMBICORT) 160-4.5 MCG/ACT inhaler Inhale 2 puffs into the lungs 2 (two) times daily. (Patient not taking: Reported on 12/04/2018)  . famotidine (PEPCID) 20 MG tablet Take 1 tablet (20 mg total) by mouth 2 (two) times daily.  . ferrous sulfate 325 (65 FE) MG EC tablet Take 1 tablet (325 mg total) by mouth daily.  [name] Kitchen GLUCOSAMINE-CHONDROITIN PO Take by mouth.  . naproxen (NAPROXEN DR) 500 MG EC tablet Take 1 tablet (500 mg total) by mouth 2 (two) times daily with a meal.   No  current facility-administered medications on file prior to visit.     Allergies:  No Known Allergies  Social History:  Social History   Socioeconomic History  . Marital status: Divorced    Spouse name: Not on file  . Number of children: Not on file  . Years of education: Not on file  . Highest education level: Some college, no degree  Occupational History  . Not on file  Social Needs  . Financial resource strain: Not hard at all  . Food insecurity:    Worry: Never true    Inability: Never true  . Transportation needs:    Medical: No    Non-medical: No  Tobacco Use  . Smoking status: Former Smoker    Types: Cigarettes    Last attempt to quit: 10/30/1989    Years since quitting: 29.1  .  Smokeless tobacco: Never Used  Substance and Sexual Activity  . Alcohol use: Yes    Comment: on occasion  . Drug use: No  . Sexual activity: Not Currently  Lifestyle  . Physical activity:    Days per week: 0 days    Minutes per session: 0 min  . Stress: Not at all  Relationships  . Social connections:    Talks on phone: More than three times a week    Gets together: More than three times a week    Attends religious service: More than 4 times per year    Active member of club or organization: No    Attends meetings of clubs or organizations: Never    Relationship status: Divorced  . Intimate partner violence:    Fear of current or ex partner: No    Emotionally abused: No    Physically abused: No    Forced sexual activity: No  Other Topics Concern  . Not on file  Social History Narrative  . Not on file   Social History   Tobacco Use  Smoking Status Former Smoker  . Types: Cigarettes  . Last attempt to quit: 10/30/1989  . Years since quitting: 29.1  Smokeless Tobacco Never Used   Social History   Substance and Sexual Activity  Alcohol Use Yes   Comment: on occasion    Family History:  Family History  Problem Relation Age of Onset  . Heart disease Mother   . COPD Mother   . Heart disease Father   . Hypertension Father   . Heart disease Maternal Grandfather   . Alzheimer's disease Paternal Grandmother   . Breast cancer Neg Hx     Past medical history, surgical history, medications, allergies, family history and social history reviewed with patient today and changes made to appropriate areas of the chart.   Review of Systems  Constitutional: Negative.   HENT: Negative.   Eyes: Negative.   Respiratory: Positive for cough and wheezing. Negative for hemoptysis, sputum production and shortness of breath.   Cardiovascular: Negative.   Gastrointestinal: Negative.   Genitourinary: Negative.   Musculoskeletal: Positive for myalgias. Negative for back pain, falls,  joint pain and neck pain.  Skin: Positive for rash. Negative for itching.  Neurological: Negative.   Endo/Heme/Allergies: Negative.   Psychiatric/Behavioral: Negative.     All other ROS negative except what is listed above and in the HPI.      Objective:    BP 118/78 (BP Location: Left Arm, Patient Position: Sitting, Cuff Size: Normal)   Pulse 64   Temp 98.1 F (36.7 C)   Ht 5\' 10"  (1.778  m)   Wt 217 lb 7 oz (98.6 kg)   BMI 31.20 kg/m   Wt Readings from Last 3 Encounters:  12/04/18 217 lb 7 oz (98.6 kg)  12/04/18 217 lb 11.2 oz (98.7 kg)  10/14/18 213 lb 12.8 oz (97 kg)    Physical Exam Vitals signs and nursing note reviewed.  Constitutional:      General: She is not in acute distress.    Appearance: Normal appearance. She is not ill-appearing, toxic-appearing or diaphoretic.  HENT:     Head: Normocephalic and atraumatic.     Right Ear: Tympanic membrane, ear canal and external ear normal. There is no impacted cerumen.     Left Ear: Tympanic membrane, ear canal and external ear normal. There is no impacted cerumen.     Nose: Nose normal. No congestion or rhinorrhea.     Mouth/Throat:     Mouth: Mucous membranes are moist.     Pharynx: Oropharynx is clear. No oropharyngeal exudate or posterior oropharyngeal erythema.  Eyes:     General: No scleral icterus.       Right eye: No discharge.        Left eye: No discharge.     Extraocular Movements: Extraocular movements intact.     Conjunctiva/sclera: Conjunctivae normal.     Pupils: Pupils are equal, round, and reactive to light.  Neck:     Musculoskeletal: Normal range of motion and neck supple. No neck rigidity or muscular tenderness.     Vascular: No carotid bruit.  Cardiovascular:     Rate and Rhythm: Normal rate and regular rhythm.     Pulses: Normal pulses.     Heart sounds: No murmur. No friction rub. No gallop.   Pulmonary:     Effort: Pulmonary effort is normal. No respiratory distress.     Breath sounds:  Normal breath sounds. No stridor. No wheezing, rhonchi or rales.  Chest:     Chest wall: No tenderness.  Abdominal:     General: Abdomen is flat. Bowel sounds are normal. There is no distension.     Palpations: Abdomen is soft. There is no mass.     Tenderness: There is no abdominal tenderness. There is no right CVA tenderness, left CVA tenderness, guarding or rebound.     Hernia: No hernia is present.  Genitourinary:    Comments: Breast and pelvic exams deferred with shared decision making Musculoskeletal:        General: No swelling, tenderness, deformity or signs of injury.     Right lower leg: No edema.     Left lower leg: No edema.  Lymphadenopathy:     Cervical: No cervical adenopathy.  Skin:    General: Skin is warm and dry.     Capillary Refill: Capillary refill takes less than 2 seconds.     Coloration: Skin is not jaundiced or pale.     Findings: Rash (papular on legs bilaterally) present. No bruising, erythema or lesion.  Neurological:     General: No focal deficit present.     Mental Status: She is alert and oriented to person, place, and time. Mental status is at baseline.     Cranial Nerves: No cranial nerve deficit.     Sensory: No sensory deficit.     Motor: No weakness.     Coordination: Coordination normal.     Gait: Gait normal.     Deep Tendon Reflexes: Reflexes normal.  Psychiatric:        Mood and  Affect: Mood normal.        Behavior: Behavior normal.        Thought Content: Thought content normal.        Judgment: Judgment normal.     Results for orders placed or performed in visit on 04/26/18  CBC with Differential/Platelet  Result Value Ref Range   WBC 4.6 3.4 - 10.8 x10E3/uL   RBC 4.71 3.77 - 5.28 x10E6/uL   Hemoglobin 13.8 11.1 - 15.9 g/dL   Hematocrit 42.9 34.0 - 46.6 %   MCV 91 79 - 97 fL   MCH 29.3 26.6 - 33.0 pg   MCHC 32.2 31.5 - 35.7 g/dL   RDW 13.9 12.3 - 15.4 %   Platelets 236 150 - 450 x10E3/uL   Neutrophils 59 Not Estab. %    Lymphs 22 Not Estab. %   Monocytes 11 Not Estab. %   Eos 7 Not Estab. %   Basos 1 Not Estab. %   Neutrophils Absolute 2.7 1.4 - 7.0 x10E3/uL   Lymphocytes Absolute 1.0 0.7 - 3.1 x10E3/uL   Monocytes Absolute 0.5 0.1 - 0.9 x10E3/uL   EOS (ABSOLUTE) 0.3 0.0 - 0.4 x10E3/uL   Basophils Absolute 0.0 0.0 - 0.2 x10E3/uL   Immature Granulocytes 0 Not Estab. %   Immature Grans (Abs) 0.0 0.0 - 0.1 x10E3/uL  Iron and TIBC  Result Value Ref Range   Total Iron Binding Capacity 272 250 - 450 ug/dL   UIBC 188 118 - 369 ug/dL   Iron 84 27 - 139 ug/dL   Iron Saturation 31 15 - 55 %  Ferritin  Result Value Ref Range   Ferritin 58 15 - 150 ng/mL      Assessment & Plan:   Problem List Items Addressed This Visit      Musculoskeletal and Integument   Osteoporosis    Due for recheck on DEXA. Ordered today. Call with any concerns.         Other   Iron deficiency anemia    Rechecking levels today. Await results. Call with any concerns.      Binge eating disorder    Stable on current regimen. Refills for 3 months given today. Call with any concerns.        Other Visit Diagnoses    Rash    -  Primary   Will treat with lotrisone. Call with any concerns.    Routine general medical examination at a health care facility       Vaccines up to date. Screening labs checked today. Pap N/A. Colonoscopy up to date. Mammogram and DEXA ordered. Call with any concerns.    Relevant Orders   CBC with Differential/Platelet   Comprehensive metabolic panel   Lipid Panel w/o Chol/HDL Ratio   Microalbumin, Urine Waived   TSH   UA/M w/rflx Culture, Routine       Follow up plan: No follow-ups on file.   LABORATORY TESTING:  - Pap smear: not applicable  IMMUNIZATIONS:   - Tdap: Tetanus vaccination status reviewed: last tetanus booster within 10 years. - Influenza: Up to date - Pneumovax: Up to date - Prevnar: Up to date - Zostavax vaccine: Up to date  SCREENING: -Mammogram: Ordered today  -  Colonoscopy: Up to date  - Bone Density: Ordered today   PATIENT COUNSELING:   Advised to take 1 mg of folate supplement per day if capable of pregnancy.   Sexuality: Discussed sexually transmitted diseases, partner selection, use of condoms, avoidance of unintended  pregnancy  and contraceptive alternatives.   Advised to avoid cigarette smoking.  I discussed with the patient that most people either abstain from alcohol or drink within safe limits (<=14/week and <=4 drinks/occasion for males, <=7/weeks and <= 3 drinks/occasion for females) and that the risk for alcohol disorders and other health effects rises proportionally with the number of drinks per week and how often a drinker exceeds daily limits.  Discussed cessation/primary prevention of drug use and availability of treatment for abuse.   Diet: Encouraged to adjust caloric intake to maintain  or achieve ideal body weight, to reduce intake of dietary saturated fat and total fat, to limit sodium intake by avoiding high sodium foods and not adding table salt, and to maintain adequate dietary potassium and calcium preferably from fresh fruits, vegetables, and low-fat dairy products.    stressed the importance of regular exercise  Injury prevention: Discussed safety belts, safety helmets, smoke detector, smoking near bedding or upholstery.   Dental health: Discussed importance of regular tooth brushing, flossing, and dental visits.    NEXT PREVENTATIVE PHYSICAL DUE IN 1 YEAR. No follow-ups on file.

## 2018-12-04 NOTE — Assessment & Plan Note (Signed)
Rechecking levels today. Await results. Call with any concerns.  

## 2018-12-05 LAB — COMPREHENSIVE METABOLIC PANEL
ALT: 14 IU/L (ref 0–32)
AST: 19 IU/L (ref 0–40)
Albumin/Globulin Ratio: 2.5 — ABNORMAL HIGH (ref 1.2–2.2)
Albumin: 4 g/dL (ref 3.8–4.8)
Alkaline Phosphatase: 102 IU/L (ref 39–117)
BUN/Creatinine Ratio: 32 — ABNORMAL HIGH (ref 12–28)
BUN: 26 mg/dL (ref 8–27)
Bilirubin Total: 0.4 mg/dL (ref 0.0–1.2)
CO2: 22 mmol/L (ref 20–29)
CREATININE: 0.81 mg/dL (ref 0.57–1.00)
Calcium: 9.4 mg/dL (ref 8.7–10.3)
Chloride: 104 mmol/L (ref 96–106)
GFR calc Af Amer: 87 mL/min/{1.73_m2} (ref >59)
GFR calc non Af Amer: 75 mL/min/{1.73_m2} (ref >59)
Globulin, Total: 1.6 g/dL (ref 1.5–4.5)
Glucose: 94 mg/dL (ref 65–99)
Potassium: 4.5 mmol/L (ref 3.5–5.2)
Sodium: 140 mmol/L (ref 134–144)
Total Protein: 5.6 g/dL — ABNORMAL LOW (ref 6.0–8.5)

## 2018-12-05 LAB — CBC WITH DIFFERENTIAL/PLATELET
BASOS: 1 %
Basophils Absolute: 0 10*3/uL (ref 0.0–0.2)
EOS (ABSOLUTE): 0.3 10*3/uL (ref 0.0–0.4)
Eos: 7 %
Hematocrit: 43.1 % (ref 34.0–46.6)
Hemoglobin: 14.1 g/dL (ref 11.1–15.9)
Immature Grans (Abs): 0 10*3/uL (ref 0.0–0.1)
Immature Granulocytes: 0 %
LYMPHS ABS: 1.1 10*3/uL (ref 0.7–3.1)
Lymphs: 26 %
MCH: 28.8 pg (ref 26.6–33.0)
MCHC: 32.7 g/dL (ref 31.5–35.7)
MCV: 88 fL (ref 79–97)
Monocytes Absolute: 0.4 10*3/uL (ref 0.1–0.9)
Monocytes: 10 %
Neutrophils Absolute: 2.4 10*3/uL (ref 1.4–7.0)
Neutrophils: 56 %
Platelets: 226 10*3/uL (ref 150–450)
RBC: 4.9 x10E6/uL (ref 3.77–5.28)
RDW: 13.1 % (ref 11.7–15.4)
WBC: 4.2 10*3/uL (ref 3.4–10.8)

## 2018-12-05 LAB — LIPID PANEL W/O CHOL/HDL RATIO
Cholesterol, Total: 167 mg/dL (ref 100–199)
HDL: 78 mg/dL (ref >39)
LDL CALC: 77 mg/dL (ref 0–99)
Triglycerides: 58 mg/dL (ref 0–149)
VLDL Cholesterol Cal: 12 mg/dL (ref 5–40)

## 2018-12-05 LAB — TSH: TSH: 4.29 u[IU]/mL (ref 0.450–4.500)

## 2019-01-03 ENCOUNTER — Encounter: Payer: Self-pay | Admitting: Family Medicine

## 2019-01-06 MED ORDER — CLOTRIMAZOLE-BETAMETHASONE 1-0.05 % EX CREA: 1.0000 "application " | TOPICAL_CREAM | Freq: Two times a day (BID) | CUTANEOUS | 2 refills | Status: DC

## 2019-02-24 ENCOUNTER — Other Ambulatory Visit: Payer: Self-pay | Admitting: Family Medicine

## 2019-03-05 ENCOUNTER — Encounter: Payer: Self-pay | Admitting: Family Medicine

## 2019-03-05 ENCOUNTER — Ambulatory Visit (INDEPENDENT_AMBULATORY_CARE_PROVIDER_SITE_OTHER): Payer: PPO | Admitting: Family Medicine

## 2019-03-05 ENCOUNTER — Other Ambulatory Visit: Payer: Self-pay

## 2019-03-05 VITALS — Ht 70.0 in | Wt 220.0 lb

## 2019-03-05 DIAGNOSIS — F5081 Binge eating disorder: Secondary | ICD-10-CM

## 2019-03-05 DIAGNOSIS — R21 Rash and other nonspecific skin eruption: Secondary | ICD-10-CM | POA: Diagnosis not present

## 2019-03-05 DIAGNOSIS — M810 Age-related osteoporosis without current pathological fracture: Secondary | ICD-10-CM | POA: Diagnosis not present

## 2019-03-05 MED ORDER — LISDEXAMFETAMINE DIMESYLATE 60 MG PO CAPS: 60.0000 mg | ORAL_CAPSULE | ORAL | 0 refills | Status: DC

## 2019-03-05 NOTE — Assessment & Plan Note (Signed)
Due for DEXA, Continue fosamax. Call with any concerns.

## 2019-03-05 NOTE — Assessment & Plan Note (Signed)
Under good control on current regimen. Continue current regimen. Continue to monitor. Call with any concerns. Refills given for 3 months.   

## 2019-03-05 NOTE — Progress Notes (Signed)
Ht 5\' 10"  (1.778 m)   Wt 220 lb (99.8 kg)   BMI 31.57 kg/m    Subjective:    Patient ID: Mckenzie Webster, female    DOB: Jan 24, 1951, 68 y.o.   MRN: [id number]  HPI: Mckenzie Webster is a 68 y.o. female  Chief Complaint  Patient presents with  . Rash    Patient states she's not able to take it as directed because it's not enough. Patient doesn't know if it's not effective or if it's because she can't apply medication BID.  [name] Kitchen Osteoporosis  . Binge Eating Disorder   Weight is staying stable. She feels like the vyvanse is effective. It seems to help. She is still not doing great with it and being home during the pandemic hasn't helped. No side effects.   OSTEOPOROSIS- tolerating it well.  Satisfied with current treatment?: yes Medication side effects: no Medication compliance: fair compliance Past osteoporosis medications/treatments: fosamax Adequate calcium & vitamin D: yes Intolerance to bisphosphonates:no Weight bearing exercises: yes  Rash is not getting any better. She puts the clobetasol on without any benefit. Not itchy, not painful, but not changing or getting better. No other concerns or complaints at this time.   Relevant past medical, surgical, family and social history reviewed and updated as indicated. Interim medical history since our last visit reviewed. Allergies and medications reviewed and updated.  Review of Systems  Constitutional: Negative.   Respiratory: Negative.   Cardiovascular: Negative.   Musculoskeletal: Negative.   Skin: Positive for rash. Negative for color change, pallor and wound.  Neurological: Negative.   Psychiatric/Behavioral: Negative.     Per HPI unless specifically indicated above     Objective:    Ht 5\' 10"  (1.778 m)   Wt 220 lb (99.8 kg)   BMI 31.57 kg/m   Wt Readings from Last 3 Encounters:  03/05/19 220 lb (99.8 kg)  12/04/18 217 lb 7 oz (98.6 kg)  12/04/18 217 lb 11.2 oz (98.7 kg)    Physical Exam Vitals signs and  nursing note reviewed.  Constitutional:      General: She is not in acute distress.    Appearance: Normal appearance. She is not ill-appearing, toxic-appearing or diaphoretic.  HENT:     Head: Normocephalic and atraumatic.     Right Ear: External ear normal.     Left Ear: External ear normal.     Nose: Nose normal.     Mouth/Throat:     Mouth: Mucous membranes are moist.     Pharynx: Oropharynx is clear.  Eyes:     General: No scleral icterus.       Right eye: No discharge.        Left eye: No discharge.     Conjunctiva/sclera: Conjunctivae normal.     Pupils: Pupils are equal, round, and reactive to light.  Neck:     Musculoskeletal: Normal range of motion.  Pulmonary:     Effort: Pulmonary effort is normal. No respiratory distress.     Comments: Speaking in full sentences Musculoskeletal: Normal range of motion.  Skin:    Coloration: Skin is not jaundiced or pale.     Findings: Rash present. No bruising, erythema or lesion.  Neurological:     Mental Status: She is alert and oriented to person, place, and time. Mental status is at baseline.  Psychiatric:        Mood and Affect: Mood normal.        Behavior: Behavior normal.  Thought Content: Thought content normal.        Judgment: Judgment normal.     Results for orders placed or performed in visit on 12/04/18  CBC with Differential/Platelet  Result Value Ref Range   WBC 4.2 3.4 - 10.8 x10E3/uL   RBC 4.90 3.77 - 5.28 x10E6/uL   Hemoglobin 14.1 11.1 - 15.9 g/dL   Hematocrit 43.1 34.0 - 46.6 %   MCV 88 79 - 97 fL   MCH 28.8 26.6 - 33.0 pg   MCHC 32.7 31.5 - 35.7 g/dL   RDW 13.1 11.7 - 15.4 %   Platelets 226 150 - 450 x10E3/uL   Neutrophils 56 Not Estab. %   Lymphs 26 Not Estab. %   Monocytes 10 Not Estab. %   Eos 7 Not Estab. %   Basos 1 Not Estab. %   Neutrophils Absolute 2.4 1.4 - 7.0 x10E3/uL   Lymphocytes Absolute 1.1 0.7 - 3.1 x10E3/uL   Monocytes Absolute 0.4 0.1 - 0.9 x10E3/uL   EOS (ABSOLUTE) 0.3  0.0 - 0.4 x10E3/uL   Basophils Absolute 0.0 0.0 - 0.2 x10E3/uL   Immature Granulocytes 0 Not Estab. %   Immature Grans (Abs) 0.0 0.0 - 0.1 x10E3/uL  Comprehensive metabolic panel  Result Value Ref Range   Glucose 94 65 - 99 mg/dL   BUN 26 8 - 27 mg/dL   Creatinine, Ser 0.81 0.57 - 1.00 mg/dL   GFR calc non Af Amer 75 >59 mL/min/1.73   GFR calc Af Amer 87 >59 mL/min/1.73   BUN/Creatinine Ratio 32 (H) 12 - 28   Sodium 140 134 - 144 mmol/L   Potassium 4.5 3.5 - 5.2 mmol/L   Chloride 104 96 - 106 mmol/L   CO2 22 20 - 29 mmol/L   Calcium 9.4 8.7 - 10.3 mg/dL   Total Protein 5.6 (L) 6.0 - 8.5 g/dL   Albumin 4.0 3.8 - 4.8 g/dL   Globulin, Total 1.6 1.5 - 4.5 g/dL   Albumin/Globulin Ratio 2.5 (H) 1.2 - 2.2   Bilirubin Total 0.4 0.0 - 1.2 mg/dL   Alkaline Phosphatase 102 39 - 117 IU/L   AST 19 0 - 40 IU/L   ALT 14 0 - 32 IU/L  Lipid Panel w/o Chol/HDL Ratio  Result Value Ref Range   Cholesterol, Total 167 100 - 199 mg/dL   Triglycerides 58 0 - 149 mg/dL   HDL 78 >39 mg/dL   VLDL Cholesterol Cal 12 5 - 40 mg/dL   LDL Calculated 77 0 - 99 mg/dL  Microalbumin, Urine Waived  Result Value Ref Range   Microalb, Ur Waived 10 0 - 19 mg/L   Creatinine, Urine Waived 100 10 - 300 mg/dL   Microalb/Creat Ratio <30 <30 mg/g  TSH  Result Value Ref Range   TSH 4.290 0.450 - 4.500 uIU/mL  UA/M w/rflx Culture, Routine  Result Value Ref Range   Specific Gravity, UA 1.025 1.005 - 1.030   pH, UA 5.0 5.0 - 7.5   Color, UA Yellow Yellow   Appearance Ur Clear Clear   Leukocytes, UA Negative Negative   Protein, UA Negative Negative/Trace   Glucose, UA Negative Negative   Ketones, UA Negative Negative   RBC, UA Negative Negative   Bilirubin, UA Negative Negative   Urobilinogen, Ur 0.2 0.2 - 1.0 mg/dL   Nitrite, UA Negative Negative      Assessment & Plan:   Problem List Items Addressed This Visit      Musculoskeletal and Integument  Osteoporosis    Due for DEXA, Continue fosamax. Call  with any concerns.         Other   Binge eating disorder - Primary    Under good control on current regimen. Continue current regimen. Continue to monitor. Call with any concerns. Refills given for 3 months.        Other Visit Diagnoses    Rash       Not significantly better with medication. Will get her in for punch biopsy. Call with any concerns.        Follow up plan: Return ASAP for punch biposy.    . This visit was completed via FaceTime due to the restrictions of the COVID-19 pandemic. All issues as above were discussed and addressed. Physical exam was done as above through visual confirmation on FaceTime. If it was felt that the patient should be evaluated in the office, they were directed there. The patient verbally consented to this visit. . Location of the patient: home . Location of the provider: home . Those involved with this call:  . Provider: Park Liter, DO . CMA: Merilyn Baba, CMA . Front Desk/Registration: Don Perking  . Time spent on call: 25 minutes with patient face to face via video conference. More than 50% of this time was spent in counseling and coordination of care. 40 minutes total spent in review of patient's record and preparation of their chart.

## 2019-03-11 ENCOUNTER — Encounter: Payer: Self-pay | Admitting: Family Medicine

## 2019-03-11 ENCOUNTER — Ambulatory Visit (INDEPENDENT_AMBULATORY_CARE_PROVIDER_SITE_OTHER): Payer: PPO | Admitting: Family Medicine

## 2019-03-11 ENCOUNTER — Other Ambulatory Visit: Payer: Self-pay

## 2019-03-11 ENCOUNTER — Other Ambulatory Visit (HOSPITAL_COMMUNITY)
Admission: RE | Admit: 2019-03-11 | Discharge: 2019-03-11 | Disposition: A | Discharge status: Home / Self Care | Payer: PPO | Source: Ambulatory Visit | Attending: Family Medicine | Admitting: Family Medicine

## 2019-03-11 VITALS — BP 158/101 | HR 69 | Temp 98.0°F | Ht 70.0 in | Wt 215.0 lb

## 2019-03-11 DIAGNOSIS — L308 Other specified dermatitis: Secondary | ICD-10-CM | POA: Diagnosis not present

## 2019-03-11 DIAGNOSIS — Z1239 Encounter for other screening for malignant neoplasm of breast: Secondary | ICD-10-CM | POA: Diagnosis not present

## 2019-03-11 DIAGNOSIS — R21 Rash and other nonspecific skin eruption: Secondary | ICD-10-CM

## 2019-03-11 NOTE — Progress Notes (Signed)
BP (!) 158/101   Pulse 69   Temp 98 F (36.7 C) (Oral)   Ht 5\' 10"  (1.778 m)   Wt 215 lb (97.5 kg)   SpO2 98%   BMI 30.85 kg/m    Subjective:    Patient ID: Mckenzie Webster, female    DOB: 01-05-1951, 68 y.o.   MRN: [id number]  HPI: Mckenzie Webster is a 68 y.o. female  Chief Complaint  Patient presents with  . Follow-up    Not significantly better with medication. Will get her in for punch biopsy. Call with any concerns.   . Rash   RASH Duration:  months  Location: generalized  Itching: no Burning: no Redness: yes Oozing: no Scaling: no Blisters: no Painful: no Fevers: no Change in detergents/soaps/personal care products: no Recent illness: no Recent travel:no History of same: yes Context: stable Alleviating factors: nothing Treatments attempted:hydrocortisone cream, benadryl, OTC anit-fungal and lotion/moisturizer Shortness of breath: no  Throat/tongue swelling: no Myalgias/arthralgias: no   Relevant past medical, surgical, family and social history reviewed and updated as indicated. Interim medical history since our last visit reviewed. Allergies and medications reviewed and updated.  Review of Systems  Constitutional: Negative.   Respiratory: Negative.   Cardiovascular: Negative.   Skin: Positive for rash. Negative for color change, pallor and wound.  Neurological: Negative.   Psychiatric/Behavioral: Negative.     Per HPI unless specifically indicated above     Objective:    BP (!) 158/101   Pulse 69   Temp 98 F (36.7 C) (Oral)   Ht 5\' 10"  (1.778 m)   Wt 215 lb (97.5 kg)   SpO2 98%   BMI 30.85 kg/m   Wt Readings from Last 3 Encounters:  03/11/19 215 lb (97.5 kg)  03/05/19 220 lb (99.8 kg)  12/04/18 217 lb 7 oz (98.6 kg)    Physical Exam Vitals signs and nursing note reviewed.  Constitutional:      General: She is not in acute distress.    Appearance: Normal appearance. She is not ill-appearing, toxic-appearing or diaphoretic.   HENT:     Head: Normocephalic and atraumatic.     Right Ear: External ear normal.     Left Ear: External ear normal.     Nose: Nose normal.     Mouth/Throat:     Mouth: Mucous membranes are moist.     Pharynx: Oropharynx is clear.  Eyes:     General: No scleral icterus.       Right eye: No discharge.        Left eye: No discharge.     Extraocular Movements: Extraocular movements intact.     Conjunctiva/sclera: Conjunctivae normal.     Pupils: Pupils are equal, round, and reactive to light.  Neck:     Musculoskeletal: Normal range of motion and neck supple.  Cardiovascular:     Rate and Rhythm: Normal rate and regular rhythm.     Pulses: Normal pulses.     Heart sounds: Normal heart sounds. No murmur. No friction rub. No gallop.   Pulmonary:     Effort: Pulmonary effort is normal. No respiratory distress.     Breath sounds: Normal breath sounds. No stridor. No wheezing, rhonchi or rales.  Chest:     Chest wall: No tenderness.  Musculoskeletal: Normal range of motion.  Skin:    General: Skin is warm and dry.     Capillary Refill: Capillary refill takes less than 2 seconds.  Coloration: Skin is not jaundiced or pale.     Findings: Rash (flat, erythematous maculopapular rash on her arms, legs, abdomen, groin) present. No bruising, erythema or lesion.  Neurological:     General: No focal deficit present.     Mental Status: She is alert and oriented to person, place, and time. Mental status is at baseline.  Psychiatric:        Mood and Affect: Mood normal.        Behavior: Behavior normal.        Thought Content: Thought content normal.        Judgment: Judgment normal.     Results for orders placed or performed in visit on 12/04/18  CBC with Differential/Platelet  Result Value Ref Range   WBC 4.2 3.4 - 10.8 x10E3/uL   RBC 4.90 3.77 - 5.28 x10E6/uL   Hemoglobin 14.1 11.1 - 15.9 g/dL   Hematocrit 43.1 34.0 - 46.6 %   MCV 88 79 - 97 fL   MCH 28.8 26.6 - 33.0 pg   MCHC  32.7 31.5 - 35.7 g/dL   RDW 13.1 11.7 - 15.4 %   Platelets 226 150 - 450 x10E3/uL   Neutrophils 56 Not Estab. %   Lymphs 26 Not Estab. %   Monocytes 10 Not Estab. %   Eos 7 Not Estab. %   Basos 1 Not Estab. %   Neutrophils Absolute 2.4 1.4 - 7.0 x10E3/uL   Lymphocytes Absolute 1.1 0.7 - 3.1 x10E3/uL   Monocytes Absolute 0.4 0.1 - 0.9 x10E3/uL   EOS (ABSOLUTE) 0.3 0.0 - 0.4 x10E3/uL   Basophils Absolute 0.0 0.0 - 0.2 x10E3/uL   Immature Granulocytes 0 Not Estab. %   Immature Grans (Abs) 0.0 0.0 - 0.1 x10E3/uL  Comprehensive metabolic panel  Result Value Ref Range   Glucose 94 65 - 99 mg/dL   BUN 26 8 - 27 mg/dL   Creatinine, Ser 0.81 0.57 - 1.00 mg/dL   GFR calc non Af Amer 75 >59 mL/min/1.73   GFR calc Af Amer 87 >59 mL/min/1.73   BUN/Creatinine Ratio 32 (H) 12 - 28   Sodium 140 134 - 144 mmol/L   Potassium 4.5 3.5 - 5.2 mmol/L   Chloride 104 96 - 106 mmol/L   CO2 22 20 - 29 mmol/L   Calcium 9.4 8.7 - 10.3 mg/dL   Total Protein 5.6 (L) 6.0 - 8.5 g/dL   Albumin 4.0 3.8 - 4.8 g/dL   Globulin, Total 1.6 1.5 - 4.5 g/dL   Albumin/Globulin Ratio 2.5 (H) 1.2 - 2.2   Bilirubin Total 0.4 0.0 - 1.2 mg/dL   Alkaline Phosphatase 102 39 - 117 IU/L   AST 19 0 - 40 IU/L   ALT 14 0 - 32 IU/L  Lipid Panel w/o Chol/HDL Ratio  Result Value Ref Range   Cholesterol, Total 167 100 - 199 mg/dL   Triglycerides 58 0 - 149 mg/dL   HDL 78 >39 mg/dL   VLDL Cholesterol Cal 12 5 - 40 mg/dL   LDL Calculated 77 0 - 99 mg/dL  Microalbumin, Urine Waived  Result Value Ref Range   Microalb, Ur Waived 10 0 - 19 mg/L   Creatinine, Urine Waived 100 10 - 300 mg/dL   Microalb/Creat Ratio <30 <30 mg/g  TSH  Result Value Ref Range   TSH 4.290 0.450 - 4.500 uIU/mL  UA/M w/rflx Culture, Routine  Result Value Ref Range   Specific Gravity, UA 1.025 1.005 - 1.030   pH, UA  5.0 5.0 - 7.5   Color, UA Yellow Yellow   Appearance Ur Clear Clear   Leukocytes, UA Negative Negative   Protein, UA Negative  Negative/Trace   Glucose, UA Negative Negative   Ketones, UA Negative Negative   RBC, UA Negative Negative   Bilirubin, UA Negative Negative   Urobilinogen, Ur 0.2 0.2 - 1.0 mg/dL   Nitrite, UA Negative Negative      Assessment & Plan:   Problem List Items Addressed This Visit    None    Visit Diagnoses    Rash    -  Primary   Of unclear etiology- shave biopsy done today. Await results.    Relevant Orders   Dermatology pathology   Breast cancer screening       Mammogram ordered today.   Relevant Orders   MM DIGITAL SCREENING BILATERAL      Skin Procedure  Procedure: Informed consent given.  Sterile prep of the area.  Area infiltrated with lidocaine with epinephrine.  Using a surgical blade, part of the upper dermis shaved off and sent  for pathology     Diagnosis:   ICD-10-CM   1. Rash R21 Dermatology pathology   Of unclear etiology- shave biopsy done today. Await results.   2. Breast cancer screening Z12.39 MM DIGITAL SCREENING BILATERAL   Mammogram ordered today.    Lesion Location/Size: Widespread flat, erythematous maculopapular rash on her arms, legs, abdomen, groin Physician: MJ Consent:  Risks, benefits, and alternative treatments discussed and all questions were answered.  Patient elected to proceed and verbal consent obtained.  Description: Area prepped and draped using semi-sterile technique. Area locally anesthetized using 3 cc's of lidocaine 1% with epi Shave biopsy of lesion performed using a dermablade.  Adequate hemostastis achieved using Silver Nitrate. Wound dressed after application of bacitracin ointment.  Post Procedure Instructions: Wound care instructions discussed and patient was instructed to keep area clean and dry.  Signs and symptoms of infection discussed, patient agrees to contact the office ASAP should they occur.  Dressing change recommended PRN.   Follow up plan: Return if symptoms worsen or fail to improve.

## 2019-03-23 ENCOUNTER — Other Ambulatory Visit: Payer: Self-pay | Admitting: Family Medicine

## 2019-04-04 ENCOUNTER — Telehealth: Payer: Self-pay | Admitting: Family Medicine

## 2019-04-04 NOTE — Telephone Encounter (Signed)
Patient notified and verbalized understanding. 

## 2019-04-04 NOTE — Telephone Encounter (Signed)
Please let her know that I got the results back on her biopsy and it looks like it may be a reaction to a medication. I'd like her to hold her fosamax for a month and we'll see if it gets better. If it doesn't, we may have to have her hold her vyvanse and see if goes away. We'll check in about a month to see how things are going and if its getting better. Thanks.

## 2019-06-04 ENCOUNTER — Other Ambulatory Visit: Payer: Self-pay

## 2019-06-04 ENCOUNTER — Ambulatory Visit (INDEPENDENT_AMBULATORY_CARE_PROVIDER_SITE_OTHER): Payer: PPO | Admitting: Family Medicine

## 2019-06-04 ENCOUNTER — Encounter: Payer: Self-pay | Admitting: Family Medicine

## 2019-06-04 DIAGNOSIS — L270 Generalized skin eruption due to drugs and medicaments taken internally: Secondary | ICD-10-CM | POA: Diagnosis not present

## 2019-06-04 DIAGNOSIS — F5081 Binge eating disorder: Secondary | ICD-10-CM

## 2019-06-04 MED ORDER — LISDEXAMFETAMINE DIMESYLATE 60 MG PO CAPS: 60.0000 mg | ORAL_CAPSULE | ORAL | 0 refills | Status: DC

## 2019-06-04 NOTE — Progress Notes (Addendum)
There were no vitals taken for this visit.   Subjective:    Patient ID: Mckenzie Webster, female    DOB: 11-07-1950, 68 y.o.   MRN: [id number]  HPI: Mckenzie Webster is a 68 y.o. female  Chief Complaint  Patient presents with  . Binge Eating Disorder   Feels like she's doing OK. Binging has been a bit worse due to being in quarantine. No side effects from the medicine. Generally feeling very well. Weight is staying stable. Vyvanse continues to be effective.   Stopped her fosamax due to rash to see if that is the cause of her allergic reaction- no significant change. She notes that it seems very slow moving, so she's not sure if it's changing. Rash has been going on for at least months, unclear how long it's been going on. Not itching. No pain. Nothing makes it better. Nothing makes it worse. It's on her legs and belly. No other concerns or complaints at this time.   Relevant past medical, surgical, family and social history reviewed and updated as indicated. Interim medical history since our last visit reviewed. Allergies and medications reviewed and updated.  Review of Systems  Constitutional: Negative.   Respiratory: Negative.   Cardiovascular: Negative.   Skin: Positive for rash. Negative for color change, pallor and wound.  Neurological: Negative.   Psychiatric/Behavioral: Negative.     Per HPI unless specifically indicated above     Objective:    There were no vitals taken for this visit.  Wt Readings from Last 3 Encounters:  03/11/19 215 lb (97.5 kg)  03/05/19 220 lb (99.8 kg)  12/04/18 217 lb 7 oz (98.6 kg)    Physical Exam Vitals signs and nursing note reviewed.  Constitutional:      General: She is not in acute distress.    Appearance: Normal appearance. She is not ill-appearing, toxic-appearing or diaphoretic.  HENT:     Head: Normocephalic and atraumatic.     Right Ear: External ear normal.     Left Ear: External ear normal.     Nose: Nose normal.   Mouth/Throat:     Mouth: Mucous membranes are moist.     Pharynx: Oropharynx is clear.  Eyes:     General: No scleral icterus.       Right eye: No discharge.        Left eye: No discharge.     Conjunctiva/sclera: Conjunctivae normal.     Pupils: Pupils are equal, round, and reactive to light.  Neck:     Musculoskeletal: Normal range of motion.  Pulmonary:     Effort: Pulmonary effort is normal. No respiratory distress.     Comments: Speaking in full sentences Musculoskeletal: Normal range of motion.  Skin:    Coloration: Skin is not jaundiced or pale.     Findings: No bruising, erythema, lesion or rash.  Neurological:     Mental Status: She is alert and oriented to person, place, and time. Mental status is at baseline.  Psychiatric:        Mood and Affect: Mood normal.        Behavior: Behavior normal.        Thought Content: Thought content normal.        Judgment: Judgment normal.     Results for orders placed or performed in visit on 12/04/18  CBC with Differential/Platelet  Result Value Ref Range   WBC 4.2 3.4 - 10.8 x10E3/uL   RBC 4.90 3.77 - 5.28  x10E6/uL   Hemoglobin 14.1 11.1 - 15.9 g/dL   Hematocrit 43.1 34.0 - 46.6 %   MCV 88 79 - 97 fL   MCH 28.8 26.6 - 33.0 pg   MCHC 32.7 31.5 - 35.7 g/dL   RDW 13.1 11.7 - 15.4 %   Platelets 226 150 - 450 x10E3/uL   Neutrophils 56 Not Estab. %   Lymphs 26 Not Estab. %   Monocytes 10 Not Estab. %   Eos 7 Not Estab. %   Basos 1 Not Estab. %   Neutrophils Absolute 2.4 1.4 - 7.0 x10E3/uL   Lymphocytes Absolute 1.1 0.7 - 3.1 x10E3/uL   Monocytes Absolute 0.4 0.1 - 0.9 x10E3/uL   EOS (ABSOLUTE) 0.3 0.0 - 0.4 x10E3/uL   Basophils Absolute 0.0 0.0 - 0.2 x10E3/uL   Immature Granulocytes 0 Not Estab. %   Immature Grans (Abs) 0.0 0.0 - 0.1 x10E3/uL  Comprehensive metabolic panel  Result Value Ref Range   Glucose 94 65 - 99 mg/dL   BUN 26 8 - 27 mg/dL   Creatinine, Ser 0.81 0.57 - 1.00 mg/dL   GFR calc non Af Amer 75 >59  mL/min/1.73   GFR calc Af Amer 87 >59 mL/min/1.73   BUN/Creatinine Ratio 32 (H) 12 - 28   Sodium 140 134 - 144 mmol/L   Potassium 4.5 3.5 - 5.2 mmol/L   Chloride 104 96 - 106 mmol/L   CO2 22 20 - 29 mmol/L   Calcium 9.4 8.7 - 10.3 mg/dL   Total Protein 5.6 (L) 6.0 - 8.5 g/dL   Albumin 4.0 3.8 - 4.8 g/dL   Globulin, Total 1.6 1.5 - 4.5 g/dL   Albumin/Globulin Ratio 2.5 (H) 1.2 - 2.2   Bilirubin Total 0.4 0.0 - 1.2 mg/dL   Alkaline Phosphatase 102 39 - 117 IU/L   AST 19 0 - 40 IU/L   ALT 14 0 - 32 IU/L  Lipid Panel w/o Chol/HDL Ratio  Result Value Ref Range   Cholesterol, Total 167 100 - 199 mg/dL   Triglycerides 58 0 - 149 mg/dL   HDL 78 >39 mg/dL   VLDL Cholesterol Cal 12 5 - 40 mg/dL   LDL Calculated 77 0 - 99 mg/dL  Microalbumin, Urine Waived  Result Value Ref Range   Microalb, Ur Waived 10 0 - 19 mg/L   Creatinine, Urine Waived 100 10 - 300 mg/dL   Microalb/Creat Ratio <30 <30 mg/g  TSH  Result Value Ref Range   TSH 4.290 0.450 - 4.500 uIU/mL  UA/M w/rflx Culture, Routine   Specimen: Urine   URINE  Result Value Ref Range   Specific Gravity, UA 1.025 1.005 - 1.030   pH, UA 5.0 5.0 - 7.5   Color, UA Yellow Yellow   Appearance Ur Clear Clear   Leukocytes, UA Negative Negative   Protein, UA Negative Negative/Trace   Glucose, UA Negative Negative   Ketones, UA Negative Negative   RBC, UA Negative Negative   Bilirubin, UA Negative Negative   Urobilinogen, Ur 0.2 0.2 - 1.0 mg/dL   Nitrite, UA Negative Negative      Assessment & Plan:   Problem List Items Addressed This Visit      Musculoskeletal and Integument   Drug rash    No significant change with coming off fosamax. Does not want to stop vyvanse. Offered referral to allergy to see if they can find out what's causing the rash- she is anxious about cost right now and doesn't want to go.  She will call if she changes her mind and we'll get her into see. Dr. Clista Bernhardt at [facility].         Other   Binge eating disorder -  Primary    Under good control on current regimen. Continue current regimen. Continue to monitor. Call with any concerns. Refills given for 3 months- follow up 3 months.            Follow up plan: No follow-ups on file.    . This visit was completed via FaceTime due to the restrictions of the COVID-19 pandemic. All issues as above were discussed and addressed. Physical exam was done as above through visual confirmation on FaceTime. If it was felt that the patient should be evaluated in the office, they were directed there. The patient verbally consented to this visit. . Location of the patient: home . Location of the provider: home . Those involved with this call:  . Provider: Park Liter, DO . CMA: Tiffany Reel, CMA . Front Desk/Registration: Don Perking  . Time spent on call: 25 minutes with patient face to face via video conference. More than 50% of this time was spent in counseling and coordination of care. 40 minutes total spent in review of patient's record and preparation of their chart.

## 2019-06-04 NOTE — Assessment & Plan Note (Signed)
No significant change with coming off fosamax. Does not want to stop vyvanse. Offered referral to allergy to see if they can find out what's causing the rash- she is anxious about cost right now and doesn't want to go. She will call if she changes her mind and we'll get her into see. Dr. Clista Bernhardt at [facility].

## 2019-06-04 NOTE — Assessment & Plan Note (Signed)
Under good control on current regimen. Continue current regimen. Continue to monitor. Call with any concerns. Refills given for 3 months- follow up 3 months.

## 2019-06-27 ENCOUNTER — Encounter: Payer: Self-pay | Admitting: Family Medicine

## 2019-07-04 ENCOUNTER — Other Ambulatory Visit: Payer: Self-pay | Admitting: Family Medicine

## 2019-07-04 MED ORDER — NAPROXEN 500 MG PO TBEC: 500.0000 mg | DELAYED_RELEASE_TABLET | Freq: Two times a day (BID) | ORAL | 3 refills | Status: DC

## 2019-08-15 ENCOUNTER — Other Ambulatory Visit: Payer: Self-pay

## 2019-08-15 ENCOUNTER — Ambulatory Visit (INDEPENDENT_AMBULATORY_CARE_PROVIDER_SITE_OTHER): Payer: PPO

## 2019-08-15 DIAGNOSIS — Z23 Encounter for immunization: Secondary | ICD-10-CM

## 2019-08-19 ENCOUNTER — Encounter: Payer: Self-pay | Admitting: Family Medicine

## 2019-08-19 DIAGNOSIS — L270 Generalized skin eruption due to drugs and medicaments taken internally: Secondary | ICD-10-CM

## 2019-09-09 ENCOUNTER — Ambulatory Visit (INDEPENDENT_AMBULATORY_CARE_PROVIDER_SITE_OTHER): Payer: PPO | Admitting: Family Medicine

## 2019-09-09 ENCOUNTER — Encounter: Payer: Self-pay | Admitting: Family Medicine

## 2019-09-09 ENCOUNTER — Other Ambulatory Visit: Payer: Self-pay

## 2019-09-09 DIAGNOSIS — F4321 Adjustment disorder with depressed mood: Secondary | ICD-10-CM | POA: Diagnosis not present

## 2019-09-09 DIAGNOSIS — F5081 Binge eating disorder: Secondary | ICD-10-CM | POA: Diagnosis not present

## 2019-09-09 MED ORDER — BUPROPION HCL ER (SR) 150 MG PO TB12: ORAL_TABLET | ORAL | 2 refills | Status: DC

## 2019-09-09 MED ORDER — LISDEXAMFETAMINE DIMESYLATE 60 MG PO CAPS: 60.0000 mg | ORAL_CAPSULE | ORAL | 0 refills | Status: DC

## 2019-09-09 NOTE — Patient Instructions (Signed)
Bupropion sustained-release tablets (Depression/Mood Disorders) What is this medicine? BUPROPION (byoo PROE pee on) is used to treat depression. This medicine may be used for other purposes; ask your health care provider or pharmacist if you have questions. COMMON BRAND NAME(S): Budeprion SR, Wellbutrin SR What should I tell my health care provider before I take this medicine? They need to know if you have any of these conditions:  an eating disorder, such as anorexia or bulimia  bipolar disorder or psychosis  diabetes or high blood sugar, treated with medication  glaucoma  head injury or brain tumor  heart disease, previous heart attack, or irregular heart beat  high blood pressure  kidney or liver disease  seizures  suicidal thoughts or a previous suicide attempt  Tourette's syndrome  weight loss  an unusual or allergic reaction to bupropion, other medicines, foods, dyes, or preservatives  breast-feeding  pregnant or trying to become pregnant How should I use this medicine? Take this medicine by mouth with a glass of water. Follow the directions on the prescription label. You can take it with or without food. If it upsets your stomach, take it with food. Do not cut, crush or chew this medicine. Take your medicine at regular intervals. If you take this medicine more than once a day, take your second dose at least 8 hours after you take your first dose. To limit difficulty in sleeping, avoid taking this medicine at bedtime. Do not take your medicine more often than directed. Do not stop taking this medicine suddenly except upon the advice of your doctor. Stopping this medicine too quickly may cause serious side effects or your condition may worsen. A special MedGuide will be given to you by the pharmacist with each prescription and refill. Be sure to read this information carefully each time. Talk to your pediatrician regarding the use of this medicine in children. Special  care may be needed. Overdosage: If you think you have taken too much of this medicine contact a poison control center or emergency room at once. NOTE: This medicine is only for you. Do not share this medicine with others. What if I miss a dose? If you miss a dose, skip the missed dose and take your next tablet at the regular time. There should be at least 8 hours between doses. Do not take double or extra doses. What may interact with this medicine? Do not take this medicine with any of the following medications:  linezolid  MAOIs like Azilect, Carbex, Eldepryl, Marplan, Nardil, and Parnate  methylene blue (injected into a vein)  other medicines that contain bupropion like Zyban This medicine may also interact with the following medications:  alcohol  certain medicines for anxiety or sleep  certain medicines for blood pressure like metoprolol, propranolol  certain medicines for depression or psychotic disturbances  certain medicines for HIV or AIDS like efavirenz, lopinavir, nelfinavir, ritonavir  certain medicines for irregular heart beat like propafenone, flecainide  certain medicines for Parkinson's disease like amantadine, levodopa  certain medicines for seizures like carbamazepine, phenytoin, phenobarbital  cimetidine  clopidogrel  cyclophosphamide  digoxin  furazolidone  isoniazid  nicotine  orphenadrine  procarbazine  steroid medicines like prednisone or cortisone  stimulant medicines for attention disorders, weight loss, or to stay awake  tamoxifen  theophylline  thiotepa  ticlopidine  tramadol  warfarin This list may not describe all possible interactions. Give your health care provider a list of all the medicines, herbs, non-prescription drugs, or dietary supplements you use. Also  tell them if you smoke, drink alcohol, or use illegal drugs. Some items may interact with your medicine. What should I watch for while using this medicine? Tell  your doctor if your symptoms do not get better or if they get worse. Visit your doctor or healthcare provider for regular checks on your progress. Because it may take several weeks to see the full effects of this medicine, it is important to continue your treatment as prescribed by your doctor. This medicine may cause serious skin reactions. They can happen weeks to months after starting the medicine. Contact your healthcare provider right away if you notice fevers or flu-like symptoms with a rash. The rash may be red or purple and then turn into blisters or peeling of the skin. Or, you might notice a red rash with swelling of the face, lips or lymph nodes in your neck or under your arms. Patients and their families should watch out for new or worsening thoughts of suicide or depression. Also watch out for sudden changes in feelings such as feeling anxious, agitated, panicky, irritable, hostile, aggressive, impulsive, severely restless, overly excited and hyperactive, or not being able to sleep. If this happens, especially at the beginning of treatment or after a change in dose, call your healthcare provider. Avoid alcoholic drinks while taking this medicine. Drinking excessive alcoholic beverages, using sleeping or anxiety medicines, or quickly stopping the use of these agents while taking this medicine may increase your risk for a seizure. Do not drive or use heavy machinery until you know how this medicine affects you. This medicine can impair your ability to perform these tasks. Do not take this medicine close to bedtime. It may prevent you from sleeping. Your mouth may get dry. Chewing sugarless gum or sucking hard candy, and drinking plenty of water may help. Contact your doctor if the problem does not go away or is severe. What side effects may I notice from receiving this medicine? Side effects that you should report to your doctor or health care professional as soon as possible:  allergic  reactions like skin rash, itching or hives, swelling of the face, lips, or tongue  breathing problems  changes in vision  confusion  elevated mood, decreased need for sleep, racing thoughts, impulsive behavior  fast or irregular heartbeat  hallucinations, loss of contact with reality  increased blood pressure  rash, fever, and swollen lymph nodes  redness, blistering, peeling or loosening of the skin, including inside the mouth  seizures  suicidal thoughts or other mood changes  unusually weak or tired  vomiting Side effects that usually do not require medical attention (report to your doctor or health care professional if they continue or are bothersome):  constipation  headache  loss of appetite  nausea  tremors  weight loss This list may not describe all possible side effects. Call your doctor for medical advice about side effects. You may report side effects to FDA at 1-800-FDA-1088. Where should I keep my medicine? Keep out of the reach of children. Store at room temperature between 20 and 25 degrees C (68 and 77 degrees F), away from direct sunlight and moisture. Keep tightly closed. Throw away any unused medicine after the expiration date. NOTE: This sheet is a summary. It may not cover all possible information. If you have questions about this medicine, talk to your doctor, pharmacist, or health care provider.  2020 [employer]vier/Gold Standard (2019-01-09 13:55:14)

## 2019-09-09 NOTE — Assessment & Plan Note (Signed)
Stable. Discussed that we cannot increase her dose and the nature of stimulants. Previously had discussed counseling and she was not interested at that time- if she continues to have issues with binging at night, we can readdress this. Refills of her medicine given today for 3 months. Call with any concerns.

## 2019-09-09 NOTE — Progress Notes (Signed)
There were no vitals taken for this visit.   Subjective:    Patient ID: Mckenzie Webster, female    DOB: November 03, 1950, 68 y.o.   MRN: [id number]  HPI: Mckenzie Webster is a 68 y.o. female  Chief Complaint  Patient presents with  . Follow-up   Mckenzie Webster presents today for follow up on her binge eating disorder. She continues to feel like her vyvanse works. She notes that it helps her to feel well during the day, but when it wears off at night, she tends to binge and eat things that she is not supposed to eat. She notes that she has been slowly gaining weight and is not happy about that. She also notes that until she takes her vyvanse she does not have energy or motivation to get up and do things. She does feel like she has been feeling a bit more down recently and feels like the dark has not been helping with that. No side effects from the medicine. She is otherwise feeling well. She is scheduled to see allergy in early December. No other concerns or complaints at this time.   Depression screen [facility] 2/9 09/09/2019 12/04/2018 12/04/2018 07/31/2018 11/28/2017  Decreased Interest 0 0 0 2 0  Down, Depressed, Hopeless 0 2 0 2 0  PHQ - 2 Score 0 2 0 4 0  Altered sleeping 0 1 - 1 1  Tired, decreased energy 0 1 - 1 0  Change in appetite 3 1 - 1 1  Feeling bad or failure about yourself  0 1 - 3 1  Trouble concentrating 0 0 - 0 0  Moving slowly or fidgety/restless 0 0 - 0 0  Suicidal thoughts 0 0 - 1 0  PHQ-9 Score 3 6 - 11 3  Difficult doing work/chores Not difficult at all Somewhat difficult - Somewhat difficult -     Relevant past medical, surgical, family and social history reviewed and updated as indicated. Interim medical history since our last visit reviewed. Allergies and medications reviewed and updated.  Review of Systems  Constitutional: Negative.   Respiratory: Negative.   Cardiovascular: Negative.   Musculoskeletal: Negative.   Skin: Negative.   Neurological: Negative.    Psychiatric/Behavioral: Positive for dysphoric mood. Negative for agitation, behavioral problems, confusion, decreased concentration, hallucinations, self-injury, sleep disturbance and suicidal ideas. The patient is not nervous/anxious and is not hyperactive.     Per HPI unless specifically indicated above     Objective:    There were no vitals taken for this visit.  Wt Readings from Last 3 Encounters:  03/11/19 215 lb (97.5 kg)  03/05/19 220 lb (99.8 kg)  12/04/18 217 lb 7 oz (98.6 kg)    Physical Exam Vitals signs and nursing note reviewed.  Constitutional:      General: She is not in acute distress.    Appearance: Normal appearance. She is not ill-appearing, toxic-appearing or diaphoretic.  HENT:     Head: Normocephalic and atraumatic.     Right Ear: External ear normal.     Left Ear: External ear normal.     Nose: Nose normal.     Mouth/Throat:     Mouth: Mucous membranes are moist.     Pharynx: Oropharynx is clear.  Eyes:     General: No scleral icterus.       Right eye: No discharge.        Left eye: No discharge.     Conjunctiva/sclera: Conjunctivae normal.  Pupils: Pupils are equal, round, and reactive to light.  Neck:     Musculoskeletal: Normal range of motion.  Pulmonary:     Effort: Pulmonary effort is normal. No respiratory distress.     Comments: Speaking in full sentences Musculoskeletal: Normal range of motion.  Skin:    Coloration: Skin is not jaundiced or pale.     Findings: No bruising, erythema, lesion or rash.  Neurological:     Mental Status: She is alert and oriented to person, place, and time. Mental status is at baseline.  Psychiatric:        Mood and Affect: Mood normal.        Behavior: Behavior normal.        Thought Content: Thought content normal.        Judgment: Judgment normal.    Results for orders placed or performed in visit on 12/04/18  CBC with Differential/Platelet  Result Value Ref Range   WBC 4.2 3.4 - 10.8 x10E3/uL    RBC 4.90 3.77 - 5.28 x10E6/uL   Hemoglobin 14.1 11.1 - 15.9 g/dL   Hematocrit 43.1 34.0 - 46.6 %   MCV 88 79 - 97 fL   MCH 28.8 26.6 - 33.0 pg   MCHC 32.7 31.5 - 35.7 g/dL   RDW 13.1 11.7 - 15.4 %   Platelets 226 150 - 450 x10E3/uL   Neutrophils 56 Not Estab. %   Lymphs 26 Not Estab. %   Monocytes 10 Not Estab. %   Eos 7 Not Estab. %   Basos 1 Not Estab. %   Neutrophils Absolute 2.4 1.4 - 7.0 x10E3/uL   Lymphocytes Absolute 1.1 0.7 - 3.1 x10E3/uL   Monocytes Absolute 0.4 0.1 - 0.9 x10E3/uL   EOS (ABSOLUTE) 0.3 0.0 - 0.4 x10E3/uL   Basophils Absolute 0.0 0.0 - 0.2 x10E3/uL   Immature Granulocytes 0 Not Estab. %   Immature Grans (Abs) 0.0 0.0 - 0.1 x10E3/uL  Comprehensive metabolic panel  Result Value Ref Range   Glucose 94 65 - 99 mg/dL   BUN 26 8 - 27 mg/dL   Creatinine, Ser 0.81 0.57 - 1.00 mg/dL   GFR calc non Af Amer 75 >59 mL/min/1.73   GFR calc Af Amer 87 >59 mL/min/1.73   BUN/Creatinine Ratio 32 (H) 12 - 28   Sodium 140 134 - 144 mmol/L   Potassium 4.5 3.5 - 5.2 mmol/L   Chloride 104 96 - 106 mmol/L   CO2 22 20 - 29 mmol/L   Calcium 9.4 8.7 - 10.3 mg/dL   Total Protein 5.6 (L) 6.0 - 8.5 g/dL   Albumin 4.0 3.8 - 4.8 g/dL   Globulin, Total 1.6 1.5 - 4.5 g/dL   Albumin/Globulin Ratio 2.5 (H) 1.2 - 2.2   Bilirubin Total 0.4 0.0 - 1.2 mg/dL   Alkaline Phosphatase 102 39 - 117 IU/L   AST 19 0 - 40 IU/L   ALT 14 0 - 32 IU/L  Lipid Panel w/o Chol/HDL Ratio  Result Value Ref Range   Cholesterol, Total 167 100 - 199 mg/dL   Triglycerides 58 0 - 149 mg/dL   HDL 78 >39 mg/dL   VLDL Cholesterol Cal 12 5 - 40 mg/dL   LDL Calculated 77 0 - 99 mg/dL  Microalbumin, Urine Waived  Result Value Ref Range   Microalb, Ur Waived 10 0 - 19 mg/L   Creatinine, Urine Waived 100 10 - 300 mg/dL   Microalb/Creat Ratio <30 <30 mg/g  TSH  Result Value Ref  Range   TSH 4.290 0.450 - 4.500 uIU/mL  UA/M w/rflx Culture, Routine   Specimen: Urine   URINE  Result Value Ref Range   Specific  Gravity, UA 1.025 1.005 - 1.030   pH, UA 5.0 5.0 - 7.5   Color, UA Yellow Yellow   Appearance Ur Clear Clear   Leukocytes, UA Negative Negative   Protein, UA Negative Negative/Trace   Glucose, UA Negative Negative   Ketones, UA Negative Negative   RBC, UA Negative Negative   Bilirubin, UA Negative Negative   Urobilinogen, Ur 0.2 0.2 - 1.0 mg/dL   Nitrite, UA Negative Negative      Assessment & Plan:   Problem List Items Addressed This Visit      Other   Binge eating disorder - Primary    Stable. Discussed that we cannot increase her dose and the nature of stimulants. Previously had discussed counseling and she was not interested at that time- if she continues to have issues with binging at night, we can readdress this. Refills of her medicine given today for 3 months. Call with any concerns.        Other Visit Diagnoses    Adjustment disorder with depressed mood       Does not want to complicate things before her allergy medicine. Continue to monitor- if she'd like to start something we will consider wellbutrin.        Follow up plan: Return in about 3 months (around 12/10/2019).   . This visit was completed via FaceTime due to the restrictions of the COVID-19 pandemic. All issues as above were discussed and addressed. Physical exam was done as above through visual confirmation on FaceTime. If it was felt that the patient should be evaluated in the office, they were directed there. The patient verbally consented to this visit. . Location of the patient: home . Location of the provider: home . Those involved with this call:  . Provider: Park Liter, DO . CMA: Tiffany Reel, CMA . Front Desk/Registration: Don Perking  . Time spent on call: 15 minutes with patient face to face via video conference. More than 50% of this time was spent in counseling and coordination of care. 23 minutes total spent in review of patient's record and preparation of their chart.

## 2019-09-11 DIAGNOSIS — H26493 Other secondary cataract, bilateral: Secondary | ICD-10-CM | POA: Diagnosis not present

## 2019-09-30 DIAGNOSIS — F329 Major depressive disorder, single episode, unspecified: Secondary | ICD-10-CM | POA: Diagnosis not present

## 2019-09-30 DIAGNOSIS — M810 Age-related osteoporosis without current pathological fracture: Secondary | ICD-10-CM | POA: Diagnosis not present

## 2019-09-30 DIAGNOSIS — R21 Rash and other nonspecific skin eruption: Secondary | ICD-10-CM | POA: Diagnosis not present

## 2019-09-30 DIAGNOSIS — B354 Tinea corporis: Secondary | ICD-10-CM | POA: Diagnosis not present

## 2019-10-13 DIAGNOSIS — R21 Rash and other nonspecific skin eruption: Secondary | ICD-10-CM | POA: Diagnosis not present

## 2019-10-13 DIAGNOSIS — L920 Granuloma annulare: Secondary | ICD-10-CM | POA: Diagnosis not present

## 2019-10-21 ENCOUNTER — Other Ambulatory Visit: Payer: Self-pay | Admitting: Family Medicine

## 2019-12-08 ENCOUNTER — Telehealth: Payer: Self-pay | Admitting: Family Medicine

## 2019-12-08 NOTE — Chronic Care Management (AMB) (Signed)
  Chronic Care Management   Outreach Note  12/08/2019 Name: Mckenzie Webster MRN: [id number] DOB: 04-Jun-1951  Mckenzie Webster is a 69 y.o. year old female who is a primary care patient of Valerie Roys, DO. I reached out to [name] by phone today in response to a referral sent by Ms. Tenna Child [place]'s health plan.     An unsuccessful telephone outreach was attempted today. The patient was referred to the case management team by for assistance with care management and care coordination.   Follow Up Plan: A HIPPA compliant phone message was left for the patient providing contact information and requesting a return call.  The care management team will reach out to the patient again over the next 7 days.  If patient returns call to provider office, please advise to call [facility] at [address], [address], [place], [place] [address] Direct Dial: [phone] [email or web address] Website: [email or web address]

## 2019-12-10 ENCOUNTER — Ambulatory Visit: Payer: PPO

## 2019-12-10 ENCOUNTER — Encounter: Payer: Self-pay | Admitting: Family Medicine

## 2019-12-10 NOTE — Chronic Care Management (AMB) (Signed)
  Chronic Care Management   Outreach Note  12/10/2019 Name: Mckenzie Webster MRN: [id number] DOB: [birth year]  Mckenzie Webster is a 69 y.o. year old female who is a primary care patient of Valerie Roys, DO. I reached out to [name] by phone today in response to a referral sent by [name]'s health plan.     A second unsuccessful telephone outreach was attempted today. The patient was referred to the case management team for assistance with care management and care coordination.   Follow Up Plan: A HIPPA compliant phone message was left for the patient providing contact information and requesting a return call.  The care management team will reach out to the patient again over the next 7 days.  If patient returns call to provider office, please advise to call [facility]  at [address], [address], [place], [place] [address] Direct Dial: [phone] [email or web address]: [email or web address]

## 2019-12-15 ENCOUNTER — Other Ambulatory Visit: Payer: Self-pay

## 2019-12-15 ENCOUNTER — Ambulatory Visit: Payer: PPO | Attending: Internal Medicine

## 2019-12-15 DIAGNOSIS — Z23 Encounter for immunization: Secondary | ICD-10-CM | POA: Insufficient documentation

## 2019-12-15 NOTE — Chronic Care Management (AMB) (Signed)
  Chronic Care Management   Outreach Note  12/15/2019 Name: Mckenzie Webster MRN: [id number] DOB: 12/26/1950  Mckenzie Webster is a 70 y.o. year old female who is a primary care patient of Valerie Roys, DO. I reached out to [name] by phone today in response to a referral sent by Ms. Mckenzie Webster's health plan.     Third unsuccessful telephone outreach was attempted today. The patient was referred to the case management team for assistance with care management and care coordination. The patient's primary care provider has been notified of our unsuccessful attempts to make or maintain contact with the patient. The care management team is pleased to engage with this patient at any time in the future should he/she be interested in assistance from the care management team.   Follow Up Plan: The care management team is available to follow up with the patient after provider conversation with the patient regarding recommendation for care management engagement and subsequent re-referral to the care management team.   Mckenzie Webster, [address], [place], [place] [address] Direct Dial: [phone] [email or web address] Website: [email or web address]

## 2019-12-15 NOTE — Progress Notes (Signed)
   Covid-19 Vaccination Clinic  Name:  Mckenzie Webster    MRN: [id number] DOB: 03/11/1951  12/15/2019  [name] was observed post Covid-19 immunization for 15 minutes without incidence. She was provided with Vaccine Information Sheet and instruction to access the V-Safe system.   [name] was instructed to call 911 with any severe reactions post vaccine: [name] Kitchen Difficulty breathing  . Swelling of your face and throat  . A fast heartbeat  . A bad rash all over your body  . Dizziness and weakness    Immunizations Administered    Name Date Dose VIS Date Route   Pfizer COVID-19 Vaccine 12/15/2019  9:51 AM 0.3 mL 10/10/2019 Intramuscular   Manufacturer: [place]   Lot: [id number]   [place]: [id number]

## 2019-12-19 ENCOUNTER — Encounter: Payer: Self-pay | Admitting: Family Medicine

## 2019-12-29 ENCOUNTER — Encounter: Payer: Self-pay | Admitting: Family Medicine

## 2019-12-29 ENCOUNTER — Telehealth (INDEPENDENT_AMBULATORY_CARE_PROVIDER_SITE_OTHER): Payer: PPO | Admitting: Family Medicine

## 2019-12-29 DIAGNOSIS — F5081 Binge eating disorder: Secondary | ICD-10-CM | POA: Diagnosis not present

## 2019-12-29 DIAGNOSIS — F4321 Adjustment disorder with depressed mood: Secondary | ICD-10-CM | POA: Diagnosis not present

## 2019-12-29 MED ORDER — LISDEXAMFETAMINE DIMESYLATE 60 MG PO CAPS: 60.0000 mg | ORAL_CAPSULE | ORAL | 0 refills | Status: DC

## 2019-12-29 NOTE — Assessment & Plan Note (Signed)
Stable, but not changing. Has been gaining weight again. Will continue her vyvanse. Refills for 3 months given today. List of counselors who work with eating disorders given today. Call with any concerns. Continue to monitor.

## 2019-12-29 NOTE — Progress Notes (Signed)
Lvm to make this f.u apt  

## 2019-12-29 NOTE — Progress Notes (Signed)
There were no vitals taken for this visit.   Subjective:    Patient ID: Mckenzie Webster, female    DOB: 1951-03-10, 69 y.o.   MRN: [id number]  HPI: Mckenzie Webster is a 69 y.o. female  Chief Complaint  Patient presents with  . Depression   DEPRESSION- has been off her wellbutrin for about a week. Didn't feel any difference being on the medication or being off of it.  Mood status: stable Satisfied with current treatment?: no Symptom severity: moderate  Duration of current treatment : chronic Side effects: no Medication compliance: fair compliance Psychotherapy/counseling: no  Previous psychiatric medications: wellbutrin (didn't do anything), vyvanse for binge eating disorder Depressed mood: yes Anxious mood: yes Anhedonia: no Significant weight loss or gain: yes Insomnia: no  Fatigue: yes Feelings of worthlessness or guilt: yes Impaired concentration/indecisiveness: yes Suicidal ideations: no Hopelessness: yes Crying spells: no Depression screen [facility] 2/9 12/29/2019 09/09/2019 12/04/2018 12/04/2018 07/31/2018  Decreased Interest 0 0 0 0 2  Down, Depressed, Hopeless 0 0 2 0 2  PHQ - 2 Score 0 0 2 0 4  Altered sleeping 0 0 1 - 1  Tired, decreased energy 0 0 1 - 1  Change in appetite 3 3 1  - 1  Feeling bad or failure about yourself  0 0 1 - 3  Trouble concentrating 0 0 0 - 0  Moving slowly or fidgety/restless 0 0 0 - 0  Suicidal thoughts 0 0 0 - 1  PHQ-9 Score 3 3 6  - 11  Difficult doing work/chores Not difficult at all Not difficult at all Somewhat difficult - Somewhat difficult   Binge eating is stable. She notes that the vyvanse helps a lot during the day, but she tends to binge at night when her meds wear out. She feels like she is self-sabotoging herself. She knows that she probably needs to do something else, but she's feeling anxious about going to talk to someone. She knows she needs to, because she doesn't want to continue feeling this way, but she doesn't really want to  go. No other concerns or complaints at this time.   Relevant past medical, surgical, family and social history reviewed and updated as indicated. Interim medical history since our last visit reviewed. Allergies and medications reviewed and updated.  Review of Systems  Constitutional: Negative.   Respiratory: Negative.   Cardiovascular: Negative.   Musculoskeletal: Negative.   Skin: Negative.   Psychiatric/Behavioral: Positive for behavioral problems and dysphoric mood. Negative for agitation, confusion, decreased concentration, hallucinations, self-injury, sleep disturbance and suicidal ideas. The patient is nervous/anxious. The patient is not hyperactive.     Per HPI unless specifically indicated above     Objective:    There were no vitals taken for this visit.  Wt Readings from Last 3 Encounters:  03/11/19 215 lb (97.5 kg)  03/05/19 220 lb (99.8 kg)  12/04/18 217 lb 7 oz (98.6 kg)    Physical Exam Vitals and nursing note reviewed.  Constitutional:      General: She is not in acute distress.    Appearance: Normal appearance. She is not ill-appearing, toxic-appearing or diaphoretic.  HENT:     Head: Normocephalic and atraumatic.     Right Ear: External ear normal.     Left Ear: External ear normal.     Nose: Nose normal.     Mouth/Throat:     Mouth: Mucous membranes are moist.     Pharynx: Oropharynx is clear.  Eyes:  General: No scleral icterus.       Right eye: No discharge.        Left eye: No discharge.     Conjunctiva/sclera: Conjunctivae normal.     Pupils: Pupils are equal, round, and reactive to light.  Pulmonary:     Effort: Pulmonary effort is normal. No respiratory distress.     Comments: Speaking in full sentences Musculoskeletal:        General: Normal range of motion.     Cervical back: Normal range of motion.  Skin:    Coloration: Skin is not jaundiced or pale.     Findings: No bruising, erythema, lesion or rash.  Neurological:     Mental  Status: She is alert and oriented to person, place, and time. Mental status is at baseline.  Psychiatric:        Mood and Affect: Mood normal.        Behavior: Behavior normal.        Thought Content: Thought content normal.        Judgment: Judgment normal.     Results for orders placed or performed in visit on 12/04/18  CBC with Differential/Platelet  Result Value Ref Range   WBC 4.2 3.4 - 10.8 x10E3/uL   RBC 4.90 3.77 - 5.28 x10E6/uL   Hemoglobin 14.1 11.1 - 15.9 g/dL   Hematocrit 43.1 34.0 - 46.6 %   MCV 88 79 - 97 fL   MCH 28.8 26.6 - 33.0 pg   MCHC 32.7 31.5 - 35.7 g/dL   RDW 13.1 11.7 - 15.4 %   Platelets 226 150 - 450 x10E3/uL   Neutrophils 56 Not Estab. %   Lymphs 26 Not Estab. %   Monocytes 10 Not Estab. %   Eos 7 Not Estab. %   Basos 1 Not Estab. %   Neutrophils Absolute 2.4 1.4 - 7.0 x10E3/uL   Lymphocytes Absolute 1.1 0.7 - 3.1 x10E3/uL   Monocytes Absolute 0.4 0.1 - 0.9 x10E3/uL   EOS (ABSOLUTE) 0.3 0.0 - 0.4 x10E3/uL   Basophils Absolute 0.0 0.0 - 0.2 x10E3/uL   Immature Granulocytes 0 Not Estab. %   Immature Grans (Abs) 0.0 0.0 - 0.1 x10E3/uL  Comprehensive metabolic panel  Result Value Ref Range   Glucose 94 65 - 99 mg/dL   BUN 26 8 - 27 mg/dL   Creatinine, Ser 0.81 0.57 - 1.00 mg/dL   GFR calc non Af Amer 75 >59 mL/min/1.73   GFR calc Af Amer 87 >59 mL/min/1.73   BUN/Creatinine Ratio 32 (H) 12 - 28   Sodium 140 134 - 144 mmol/L   Potassium 4.5 3.5 - 5.2 mmol/L   Chloride 104 96 - 106 mmol/L   CO2 22 20 - 29 mmol/L   Calcium 9.4 8.7 - 10.3 mg/dL   Total Protein 5.6 (L) 6.0 - 8.5 g/dL   Albumin 4.0 3.8 - 4.8 g/dL   Globulin, Total 1.6 1.5 - 4.5 g/dL   Albumin/Globulin Ratio 2.5 (H) 1.2 - 2.2   Bilirubin Total 0.4 0.0 - 1.2 mg/dL   Alkaline Phosphatase 102 39 - 117 IU/L   AST 19 0 - 40 IU/L   ALT 14 0 - 32 IU/L  Lipid Panel w/o Chol/HDL Ratio  Result Value Ref Range   Cholesterol, Total 167 100 - 199 mg/dL   Triglycerides 58 0 - 149 mg/dL   HDL  78 >39 mg/dL   VLDL Cholesterol Cal 12 5 - 40 mg/dL   LDL Calculated 77 0 -  99 mg/dL  Microalbumin, Urine Waived  Result Value Ref Range   Microalb, Ur Waived 10 0 - 19 mg/L   Creatinine, Urine Waived 100 10 - 300 mg/dL   Microalb/Creat Ratio <30 <30 mg/g  TSH  Result Value Ref Range   TSH 4.290 0.450 - 4.500 uIU/mL  UA/M w/rflx Culture, Routine   Specimen: Urine   URINE  Result Value Ref Range   Specific Gravity, UA 1.025 1.005 - 1.030   pH, UA 5.0 5.0 - 7.5   Color, UA Yellow Yellow   Appearance Ur Clear Clear   Leukocytes, UA Negative Negative   Protein, UA Negative Negative/Trace   Glucose, UA Negative Negative   Ketones, UA Negative Negative   RBC, UA Negative Negative   Bilirubin, UA Negative Negative   Urobilinogen, Ur 0.2 0.2 - 1.0 mg/dL   Nitrite, UA Negative Negative      Assessment & Plan:   Problem List Items Addressed This Visit    None       Follow up plan: No follow-ups on file.  >25 minutes spent in counseling and coordination of care with patient today  . This visit was completed via MyChart due to the restrictions of the COVID-19 pandemic. All issues as above were discussed and addressed. Physical exam was done as above through visual confirmation on MyChart. If it was felt that the patient should be evaluated in the office, they were directed there. The patient verbally consented to this visit. . Location of the patient: home . Location of the provider: work . Those involved with this call:  . Provider: Park Liter, DO . CMA: Yvonna Alanis, [place] . Front Desk/Registration: Don Perking  . Time spent on call: 25 minutes with patient face to face via video conference. More than 50% of this time was spent in counseling and coordination of care. 40 minutes total spent in review of patient's record and preparation of their chart.

## 2020-01-05 ENCOUNTER — Encounter: Payer: Self-pay | Admitting: Family Medicine

## 2020-01-05 NOTE — Progress Notes (Signed)
Lvm, sent letter to make this f/u apt

## 2020-01-13 ENCOUNTER — Ambulatory Visit: Payer: PPO | Attending: Internal Medicine

## 2020-01-13 DIAGNOSIS — Z23 Encounter for immunization: Secondary | ICD-10-CM

## 2020-01-13 NOTE — Progress Notes (Signed)
   Covid-19 Vaccination Clinic  Name:  Mckenzie Webster    MRN: [id number] DOB: 1951/08/18  01/13/2020  Mckenzie Webster was observed post Covid-19 immunization for 15 minutes without incident. She was provided with Vaccine Information Sheet and instruction to access the V-Safe system.   Mckenzie Webster was instructed to call 911 with any severe reactions post vaccine: [name] Kitchen Difficulty breathing  . Swelling of face and throat  . A fast heartbeat  . A bad rash all over body  . Dizziness and weakness   Immunizations Administered    Name Date Dose VIS Date Route   Pfizer COVID-19 Vaccine 01/13/2020  9:28 AM 0.3 mL 10/10/2019 Intramuscular   Manufacturer: [place]   Lot: [id number]   [place]: [id number]

## 2020-01-26 NOTE — Progress Notes (Signed)
Unable to lvm for this f.u apt

## 2020-01-30 DIAGNOSIS — L920 Granuloma annulare: Secondary | ICD-10-CM | POA: Diagnosis not present

## 2020-02-17 ENCOUNTER — Other Ambulatory Visit: Payer: Self-pay | Admitting: Family Medicine

## 2020-03-04 ENCOUNTER — Encounter: Payer: Self-pay | Admitting: Family Medicine

## 2020-03-12 ENCOUNTER — Encounter: Payer: Self-pay | Admitting: Family Medicine

## 2020-03-15 DIAGNOSIS — D8989 Other specified disorders involving the immune mechanism, not elsewhere classified: Secondary | ICD-10-CM | POA: Diagnosis not present

## 2020-03-22 ENCOUNTER — Ambulatory Visit (INDEPENDENT_AMBULATORY_CARE_PROVIDER_SITE_OTHER): Payer: PPO

## 2020-03-22 DIAGNOSIS — Z0000 Encounter for general adult medical examination without abnormal findings: Secondary | ICD-10-CM | POA: Diagnosis not present

## 2020-03-22 NOTE — Patient Instructions (Signed)
Mckenzie Webster , Thank you for taking time to come for your Medicare Wellness Visit. I appreciate your ongoing commitment to your health goals. Please review the following plan we discussed and let me know if I can assist you in the future.   Screening recommendations/referrals: Colonoscopy: GI will call to schedule  Mammogram: Please call [phone] to schedule your mammogram.  Bone Density: schedule with mammogram  Recommended yearly ophthalmology/optometry visit for glaucoma screening and checkup Recommended yearly dental visit for hygiene and checkup  Vaccinations: Influenza vaccine: up to date , due 06/2020 Pneumococcal vaccine: completed  Tdap vaccine: completed 2017 Shingles vaccine: shingrix completed    Covid-19: completed   Advanced directives: Please pick up a copy of this information next time you come in to the office.   Conditions/risks identified: none   Next appointment: Follow up in one year for your annual wellness visit.    Preventive Care 39 Years and Older, Female Preventive care refers to lifestyle choices and visits with your health care provider that can promote health and wellness. What does preventive care include?  A yearly physical exam. This is also called an annual well check.  Dental exams once or twice a year.  Routine eye exams. Ask your health care provider how often you should have your eyes checked.  Personal lifestyle choices, including:  Daily care of your teeth and gums.  Regular physical activity.  Eating a healthy diet.  Avoiding tobacco and drug use.  Limiting alcohol use.  Practicing safe sex.  Taking low-dose aspirin every day.  Taking vitamin and mineral supplements as recommended by your health care provider. What happens during an annual well check? The services and screenings done by your health care provider during your annual well check will depend on your age, overall health, lifestyle risk factors, and family history  of disease. Counseling  Your health care provider may ask you questions about your:  Alcohol use.  Tobacco use.  Drug use.  Emotional well-being.  Home and relationship well-being.  Sexual activity.  Eating habits.  History of falls.  Memory and ability to understand (cognition).  Work and work Statistician.  Reproductive health. Screening  You may have the following tests or measurements:  Height, weight, and BMI.  Blood pressure.  Lipid and cholesterol levels. These may be checked every 5 years, or more frequently if you are over 52 years old.  Skin check.  Lung cancer screening. You may have this screening every year starting at age 80 if you have a 30-pack-year history of smoking and currently smoke or have quit within the past 15 years.  Fecal occult blood test (FOBT) of the stool. You may have this test every year starting at age 60.  Flexible sigmoidoscopy or colonoscopy. You may have a sigmoidoscopy every 5 years or a colonoscopy every 10 years starting at age 39.  Hepatitis C blood test.  Hepatitis B blood test.  Sexually transmitted disease (STD) testing.  Diabetes screening. This is done by checking your blood sugar (glucose) after you have not eaten for a while (fasting). You may have this done every 1-3 years.  Bone density scan. This is done to screen for osteoporosis. You may have this done starting at age 43.  Mammogram. This may be done every 1-2 years. Talk to your health care provider about how often you should have regular mammograms. Talk with your health care provider about your test results, treatment options, and if necessary, the need for more tests. Vaccines  Your health care provider may recommend certain vaccines, such as:  Influenza vaccine. This is recommended every year.  Tetanus, diphtheria, and acellular pertussis (Tdap, Td) vaccine. You may need a Td booster every 10 years.  Zoster vaccine. You may need this after age  74.  Pneumococcal 13-valent conjugate (PCV13) vaccine. One dose is recommended after age 19.  Pneumococcal polysaccharide (PPSV23) vaccine. One dose is recommended after age 2. Talk to your health care provider about which screenings and vaccines you need and how often you need them. This information is not intended to replace advice given to you by your health care provider. Make sure you discuss any questions you have with your health care provider. Document Released: 11/12/2015 Document Revised: 07/05/2016 Document Reviewed: 08/17/2015 Elsevier Interactive Patient Education  2017 [employer] Prevention in the Home Falls can cause injuries. They can happen to people of all ages. There are many things you can do to make your home safe and to help prevent falls. What can I do on the outside of my home?  Regularly fix the edges of walkways and driveways and fix any cracks.  Remove anything that might make you trip as you walk through a door, such as a raised step or threshold.  Trim any bushes or trees on the path to your home.  Use bright outdoor lighting.  Clear any walking paths of anything that might make someone trip, such as rocks or tools.  Regularly check to see if handrails are loose or broken. Make sure that both sides of any steps have handrails.  Any raised decks and porches should have guardrails on the edges.  Have any leaves, snow, or ice cleared regularly.  Use sand or salt on walking paths during winter.  Clean up any spills in your garage right away. This includes oil or grease spills. What can I do in the bathroom?  Use night lights.  Install grab bars by the toilet and in the tub and shower. Do not use towel bars as grab bars.  Use non-skid mats or decals in the tub or shower.  If you need to sit down in the shower, use a plastic, non-slip stool.  Keep the floor dry. Clean up any water that spills on the floor as soon as it happens.  Remove  soap buildup in the tub or shower regularly.  Attach bath mats securely with double-sided non-slip rug tape.  Do not have throw rugs and other things on the floor that can make you trip. What can I do in the bedroom?  Use night lights.  Make sure that you have a light by your bed that is easy to reach.  Do not use any sheets or blankets that are too big for your bed. They should not hang down onto the floor.  Have a firm chair that has side arms. You can use this for support while you get dressed.  Do not have throw rugs and other things on the floor that can make you trip. What can I do in the kitchen?  Clean up any spills right away.  Avoid walking on wet floors.  Keep items that you use a lot in easy-to-reach places.  If you need to reach something above you, use a strong step stool that has a grab bar.  Keep electrical cords out of the way.  Do not use floor polish or wax that makes floors slippery. If you must use wax, use non-skid floor wax.  Do  not have throw rugs and other things on the floor that can make you trip. What can I do with my stairs?  Do not leave any items on the stairs.  Make sure that there are handrails on both sides of the stairs and use them. Fix handrails that are broken or loose. Make sure that handrails are as long as the stairways.  Check any carpeting to make sure that it is firmly attached to the stairs. Fix any carpet that is loose or worn.  Avoid having throw rugs at the top or bottom of the stairs. If you do have throw rugs, attach them to the floor with carpet tape.  Make sure that you have a light switch at the top of the stairs and the bottom of the stairs. If you do not have them, ask someone to add them for you. What else can I do to help prevent falls?  Wear shoes that:  Do not have high heels.  Have rubber bottoms.  Are comfortable and fit you well.  Are closed at the toe. Do not wear sandals.  If you use a  stepladder:  Make sure that it is fully opened. Do not climb a closed stepladder.  Make sure that both sides of the stepladder are locked into place.  Ask someone to hold it for you, if possible.  Clearly mark and make sure that you can see:  Any grab bars or handrails.  First and last steps.  Where the edge of each step is.  Use tools that help you move around (mobility aids) if they are needed. These include:  Canes.  Walkers.  Scooters.  Crutches.  Turn on the lights when you go into a dark area. Replace any light bulbs as soon as they burn out.  Set up your furniture so you have a clear path. Avoid moving your furniture around.  If any of your floors are uneven, fix them.  If there are any pets around you, be aware of where they are.  Review your medicines with your doctor. Some medicines can make you feel dizzy. This can increase your chance of falling. Ask your doctor what other things that you can do to help prevent falls. This information is not intended to replace advice given to you by your health care provider. Make sure you discuss any questions you have with your health care provider. Document Released: 08/12/2009 Document Revised: 03/23/2016 Document Reviewed: 11/20/2014 Elsevier Interactive Patient Education  2017 [employer].

## 2020-03-22 NOTE — Progress Notes (Signed)
Subjective:   Mckenzie Webster is a 69 y.o. female who presents for Medicare Annual (Subsequent) preventive examination.  I connected with Mckenzie Webster today by telephone and verified that I am speaking with the correct person using two identifiers. Location patient: home Location provider: work Persons participating in the virtual visit: patient, provider.   I discussed the limitations, risks, security and privacy concerns of performing an evaluation and management service by telephone and the availability of in person appointments. I also discussed with the patient that there may be a patient responsible charge related to this service. The patient expressed understanding and verbally consented to this telephonic visit.    Interactive audio and video telecommunications were attempted between this provider and patient, however failed, due to patient having technical difficulties OR patient did not have access to video capability.  We continued and completed visit with audio only.  Some vital signs may be absent or patient reported.   Time Spent with patient on telephone encounter: 20 minutes  Review of Systems:  Cardiac Risk Factors include: advanced age (>15men, >6 women);dyslipidemia;hypertension     Objective:     Vitals: There were no vitals taken for this visit.  There is no height or weight on file to calculate BMI.  Advanced Directives 12/04/2018 11/28/2017 04/27/2017 11/24/2016 04/07/2016 01/06/2016  Does Patient Have a Medical Advance Directive? No Yes No No No No  Does patient want to make changes to medical advance directive? - Yes (MAU/Ambulatory/Procedural Areas - Information given) - - - -  Would patient like information on creating a medical advance directive? Yes (MAU/Ambulatory/Procedural Areas - Information given) - - Yes (MAU/Ambulatory/Procedural Areas - Information given) - -  Pre-existing out of facility DNR order (yellow form or pink MOST form) - - Physician notified to  receive inpatient order - - -    Tobacco Social History   Tobacco Use  Smoking Status Former Smoker  . Types: Cigarettes  . Quit date: 10/30/1989  . Years since quitting: 30.4  Smokeless Tobacco Never Used     Counseling given: Not Answered   Clinical Intake:  Pre-visit preparation completed: Yes  Pain : No/denies pain     Nutritional Risks: None Diabetes: No  How often do you need to have someone help you when you read instructions, pamphlets, or other written materials from your doctor or pharmacy?: 1 - Never  Interpreter Needed?: No  Information entered by :: Kadon Andrus,lPN  Past Medical History:  Diagnosis Date  . Anemia   . Cataract   . Dry eye syndrome   . Positive colorectal cancer screening using Cologuard test 02/14/2017  . Stomach ulcer    Past Surgical History:  Procedure Laterality Date  . COLONOSCOPY WITH PROPOFOL N/A 04/27/2017   Procedure: COLONOSCOPY WITH PROPOFOL;  Surgeon: Jonathon Bellows, MD;  Location: [facility] ENDOSCOPY;  Service: Endoscopy;  Laterality: N/A;  . EYE SURGERY     Cataract  . FOOT SURGERY     Change the alignment of the toes to stop the formation of corns  . gastric bypass  1981   Family History  Problem Relation Age of Onset  . Heart disease Mother   . COPD Mother   . Heart disease Father   . Hypertension Father   . Heart disease Maternal Grandfather   . Alzheimer's disease Paternal Grandmother   . Breast cancer Neg Hx    Social History   Socioeconomic History  . Marital status: Divorced    Spouse name: Not  on file  . Number of children: Not on file  . Years of education: Not on file  . Highest education level: Some college, no degree  Occupational History  . Occupation: Tax inspector part time   Tobacco Use  . Smoking status: Former Smoker    Types: Cigarettes    Quit date: 10/30/1989    Years since quitting: 30.4  . Smokeless tobacco: Never Used  Substance and Sexual Activity  . Alcohol use: Yes    Comment: on  occasion  . Drug use: No  . Sexual activity: Not Currently  Other Topics Concern  . Not on file  Social History Narrative  . Not on file   Social Determinants of Health   Financial Resource Strain:   . Difficulty of Paying Living Expenses:   Food Insecurity:   . Worried About Charity fundraiser in the Last Year:   . Arboriculturist in the Last Year:   Transportation Needs:   . Film/video editor (Medical):   [name] Kitchen Lack of Transportation (Non-Medical):   Physical Activity:   . Days of Exercise per Week:   . Minutes of Exercise per Session:   Stress:   . Feeling of Stress :   Social Connections:   . Frequency of Communication with Friends and Family:   . Frequency of Social Gatherings with Friends and Family:   . Attends Religious Services:   . Active Member of Clubs or Organizations:   . Attends Archivist Meetings:   [name] Kitchen Marital Status:     Outpatient Encounter Medications as of 03/22/2020  Medication Sig  . cetirizine (ZYRTEC) 10 MG tablet Take 10 mg by mouth daily.  . famotidine (PEPCID) 20 MG tablet TAKE 1 TABLET BY MOUTH TWICE A DAY  . ferrous sulfate 325 (65 FE) MG EC tablet Take 1 tablet (325 mg total) by mouth daily.  . fluticasone (FLONASE) 50 MCG/ACT nasal spray Place 2 sprays into both nostrils daily.   [name] Kitchen GLUCOSAMINE-CHONDROITIN PO Take 2 tablets by mouth daily.   . hydroxychloroquine (PLAQUENIL) 200 MG tablet Take 1 tablet by mouth 2 (two) times daily.  [name] Kitchen lisdexamfetamine (VYVANSE) 60 MG capsule Take 1 capsule (60 mg total) by mouth every morning.  . naproxen (NAPROXEN DR) 500 MG EC tablet Take 1 tablet (500 mg total) by mouth 2 (two) times daily with a meal.  . triamcinolone cream (KENALOG) 0.1 % Apply 1 application topically as needed.  . [DISCONTINUED] alendronate (FOSAMAX) 70 MG tablet TAKE 1 TABLET BY MOUTH ONCE A WEEK.TAKE WITH FULL GLASS OR WATER ON EMPTY STOMACH  . [DISCONTINUED] lisdexamfetamine (VYVANSE) 60 MG capsule Take by mouth.  .  [DISCONTINUED] albuterol (PROVENTIL HFA;VENTOLIN HFA) 108 (90 Base) MCG/ACT inhaler Inhale 2 puffs into the lungs every 6 (six) hours as needed for wheezing or shortness of breath. (Patient not taking: Reported on 12/29/2019)  . [DISCONTINUED] lisdexamfetamine (VYVANSE) 60 MG capsule Take 1 capsule (60 mg total) by mouth every morning.  . [DISCONTINUED] lisdexamfetamine (VYVANSE) 60 MG capsule Take 1 capsule (60 mg total) by mouth every morning.   No facility-administered encounter medications on file as of 03/22/2020.    Activities of Daily Living In your present state of health, do you have any difficulty performing the following activities: 03/22/2020 12/29/2019  Hearing? N N  Comment no hearing aids -  Vision? N N  Comment Dr.Woodard, reading eyeglasses -  Difficulty concentrating or making decisions? N N  Walking or climbing stairs? N N  Dressing or bathing? N N  Doing errands, shopping? N N  Preparing Food and eating ? N -  Using the Toilet? N -  In the past six months, have you accidently leaked urine? N -  Do you have problems with loss of bowel control? N -  Managing your Medications? N -  Managing your Finances? N -  Housekeeping or managing your Housekeeping? N -  Some recent data might be hidden    Patient Care Team: Valerie Roys, DO as PCP - General (Family Medicine) Conception Chancy, DDS (Dentistry)    Assessment:   This is a routine wellness examination for [place].  Exercise Activities and Dietary recommendations Exercise limited by: None identified  Goals Addressed   None     Fall Risk: Fall Risk  03/22/2020 09/09/2019 12/04/2018 11/28/2017 11/28/2017  Falls in the past year? 0 0 0 No No  Number falls in past yr: 0 0 0 - -  Injury with Fall? 0 0 - - -    FALL RISK PREVENTION PERTAINING TO THE HOME:  Any stairs in or around the home? Yes  If so, are there any without handrails? No   Home free of loose throw rugs in walkways, pet beds, electrical cords, etc?  Yes  Adequate lighting in your home to reduce risk of falls? Yes   ASSISTIVE DEVICES UTILIZED TO PREVENT FALLS:  Life alert? No  Use of a cane, walker or w/c? No  Grab bars in the bathroom? No  Shower chair or bench in shower? No  Elevated toilet seat or a handicapped toilet? No   DME ORDERS:  DME order needed?  No   TIMED UP AND GO:  Unable to perform    Depression Screen PHQ 2/9 Scores 03/22/2020 12/29/2019 09/09/2019 12/04/2018  PHQ - 2 Score 0 0 0 2  PHQ- 9 Score - 3 3 6      Cognitive Function     6CIT Screen 12/04/2018 11/28/2017 11/24/2016  What Year? 0 points 0 points 0 points  What month? 0 points 0 points 0 points  What time? 0 points 0 points 0 points  Count back from 20 0 points 0 points 0 points  Months in reverse 0 points 0 points 0 points  Repeat phrase 0 points 0 points 0 points  Total Score 0 0 0    Immunization History  Administered Date(s) Administered  . Fluad Quad(high Dose 65+) 08/15/2019  . Influenza, High Dose Seasonal PF 08/17/2017, 07/31/2018  . Influenza,inj,Quad PF,6+ Mos 11/24/2016  . PFIZER SARS-COV-2 Vaccination 12/15/2019, 01/13/2020  . Pneumococcal Conjugate-13 11/24/2016  . Pneumococcal Polysaccharide-23 11/28/2017  . Tdap 02/16/2016  . Zoster 02/14/2017  . Zoster Recombinat (Shingrix) 02/14/2017, 08/17/2017    Qualifies for Shingles Vaccine? Completed shingrix series   Tdap: up to date   Flu Vaccine: up to date   Pneumococcal Vaccine: up to date   Covid-19 Vaccine: Completed vaccines  Screening Tests Health Maintenance  Topic Date Due  . MAMMOGRAM  01/11/2019  . COLONOSCOPY  04/27/2020  . INFLUENZA VACCINE  05/30/2020  . TETANUS/TDAP  02/15/2026  . DEXA SCAN  Completed  . COVID-19 Vaccine  Completed  . Hepatitis C Screening  Completed  . PNA vac Low Risk Adult  Completed    Cancer Screenings:  Colorectal Screening: Completed 04/27/2017. Repeat every 3 years; GI to schedule for this year   Mammogram: .ordered    Bone Density: Completed 2018  R  Lung Cancer Screening: (Low Dose CT  Chest recommended if Age 68-80 years, 30 pack-year currently smoking OR have quit w/in 15years.) does not qualify.    Additional Screening:  Hepatitis C Screening: does qualify; Completed 2018  Vision Screening: Recommended annual ophthalmology exams for early detection of glaucoma and other disorders of the eye. Is the patient up to date with their annual eye exam?  Yes    Dental Screening: Recommended annual dental exams for proper oral hygiene  Community Resource Referral:  CRR required this visit?  No       Plan:  I have personally reviewed and addressed the Medicare Annual Wellness questionnaire and have noted the following in the patient's chart:  A. Medical and social history B. Use of alcohol, tobacco or illicit drugs  C. Current medications and supplements D. Functional ability and status E.  Nutritional status F.  Physical activity G. Advance directives H. List of other physicians I.  Hospitalizations, surgeries, and ER visits in previous 12 months J.  [place] such as hearing and vision if needed, cognitive and depression L. Referrals and appointments   In addition, I have reviewed and discussed with patient certain preventive protocols, quality metrics, and best practice recommendations. A written personalized care plan for preventive services as well as general preventive health recommendations were provided to patient.  Due to this being a telephonic visit, the after visit summary with patients personalized plan was offered to patient via mail or my-chart. Patient would like to access on my-chart.  Signed,     Bevelyn Ngo, LPN  [phone] Nurse Health Advisor    Nurse Notes: patient states she was bite by her neighbors dog on Saturday, states the dog is a mutt but is unsure if the dog is up to date on its shots. States she doesn't feel safe asking her neighbor as she  isnt very friendly and doesn't want to report it but wanted to make PCP aware of the bite. Is up to date on TDAP- completed 02/16/2016. Denies any signs of infection, no redness, inflammation or fever. States it is just bruised and bled a little after the bites.  Recommended her to ask neighbor about vaccine status of dog or to call animal control to ask as she might need a rabies vaccine if dog is not up to date, discussed wit Merrie Roof.PA who agrees to plan and if patient needs rabies vaccine she can get at ED, if dog is up to date on vaccines she can schedule appt with her for abx treatment. Called patient back who does not want to go to ED, agreed to schedule visit with Merrie Roof, PA virtually for tomorrow. Scheduled for 03/23/2020 at 9:00am.

## 2020-03-23 ENCOUNTER — Ambulatory Visit: Payer: PPO | Admitting: Family Medicine

## 2020-03-23 ENCOUNTER — Telehealth: Payer: Self-pay | Admitting: Family Medicine

## 2020-03-23 ENCOUNTER — Ambulatory Visit (INDEPENDENT_AMBULATORY_CARE_PROVIDER_SITE_OTHER): Payer: PPO | Admitting: Family Medicine

## 2020-03-23 ENCOUNTER — Encounter: Payer: Self-pay | Admitting: Family Medicine

## 2020-03-23 ENCOUNTER — Other Ambulatory Visit: Payer: Self-pay

## 2020-03-23 VITALS — BP 187/92 | HR 70 | Temp 97.8°F | Wt 215.0 lb

## 2020-03-23 DIAGNOSIS — M79601 Pain in right arm: Secondary | ICD-10-CM | POA: Diagnosis not present

## 2020-03-23 DIAGNOSIS — F339 Major depressive disorder, recurrent, unspecified: Secondary | ICD-10-CM

## 2020-03-23 MED ORDER — SERTRALINE HCL 50 MG PO TABS: 50.0000 mg | ORAL_TABLET | Freq: Every day | ORAL | 0 refills | Status: DC

## 2020-03-23 MED ORDER — AMOXICILLIN-POT CLAVULANATE 875-125 MG PO TABS: 1.0000 | ORAL_TABLET | Freq: Two times a day (BID) | ORAL | 0 refills | Status: DC

## 2020-03-23 NOTE — Telephone Encounter (Signed)
Spoke with pt, rx shows stuck and not submitted, got tiffany to call pharmacy and give verbal. Called pt to let her know that rx has been sent to the pharmacy. Pt verbalized understanding

## 2020-03-23 NOTE — Telephone Encounter (Signed)
Patient called in again checking on status of having scripts sent over to pharmacy. Patient has been waiting for over an order at pharmacy. Please advise and resend.

## 2020-03-23 NOTE — Progress Notes (Signed)
BP (!) 187/92   Pulse 70   Temp 97.8 F (36.6 C) (Oral)   Wt 215 lb (97.5 kg)   SpO2 99%   BMI 30.85 kg/m    Subjective:    Patient ID: Mckenzie Webster, female    DOB: January 25, 1951, 69 y.o.   MRN: [id number]  HPI: Mckenzie Webster is a 69 y.o. female  Chief Complaint  Patient presents with  . Animal Bite    pt states got a dog bite on right fore arm last Saturday   Presenting today for dog bite to right forearm that occurred 4 days ago. Has been keeping it clean with peroxide and notes improvement and healing. Denies redness, edema, warmth, drainage from either wound. Unclear if dog was UTD on rabies vaccines, pt UTD on Td.   Wanting to discuss her ongoing issues with depression. Some days has trouble getting out of bed, finding motivation to do the things she likes doing, sometimes feeling sad for no particular reason. Seems to improve some when she gets out of the house whether for work or other activities. Has tried Effexor and wellbutrin in the past with no benefit. Denies SI/HI, panic episodes.   Depression screen [facility] 2/9 03/22/2020 12/29/2019 09/09/2019  Decreased Interest 0 0 0  Down, Depressed, Hopeless 0 0 0  PHQ - 2 Score 0 0 0  Altered sleeping - 0 0  Tired, decreased energy - 0 0  Change in appetite - 3 3  Feeling bad or failure about yourself  - 0 0  Trouble concentrating - 0 0  Moving slowly or fidgety/restless - 0 0  Suicidal thoughts - 0 0  PHQ-9 Score - 3 3  Difficult doing work/chores - Not difficult at all Not difficult at all   GAD 7 : Generalized Anxiety Score 07/31/2018 01/04/2016  Nervous, Anxious, on Edge 0 1  Control/stop worrying 0 3  Worry too much - different things 0 3  Trouble relaxing 0 2  Restless 0 0  Easily annoyed or irritable 1 2  Afraid - awful might happen 0 1  Total GAD 7 Score 1 12  Anxiety Difficulty Very difficult Somewhat difficult     Relevant past medical, surgical, family and social history reviewed and updated as indicated.  Interim medical history since our last visit reviewed. Allergies and medications reviewed and updated.  Review of Systems  Per HPI unless specifically indicated above     Objective:    BP (!) 187/92   Pulse 70   Temp 97.8 F (36.6 C) (Oral)   Wt 215 lb (97.5 kg)   SpO2 99%   BMI 30.85 kg/m   Wt Readings from Last 3 Encounters:  03/23/20 215 lb (97.5 kg)  03/11/19 215 lb (97.5 kg)  03/05/19 220 lb (99.8 kg)    Physical Exam Vitals and nursing note reviewed.  Constitutional:      Appearance: Normal appearance. She is not ill-appearing.  HENT:     Head: Atraumatic.  Eyes:     Extraocular Movements: Extraocular movements intact.     Conjunctiva/sclera: Conjunctivae normal.  Cardiovascular:     Rate and Rhythm: Normal rate and regular rhythm.     Heart sounds: Normal heart sounds.  Pulmonary:     Effort: Pulmonary effort is normal.     Breath sounds: Normal breath sounds.  Musculoskeletal:        General: Normal range of motion.     Cervical back: Normal range of motion and  neck supple.  Skin:    General: Skin is warm and dry.     Comments: Mild bruising and 2 well healing puncture wounds on right forearm. No edema, erythema, warmth, drainage from wounds  Neurological:     Mental Status: She is alert and oriented to person, place, and time.  Psychiatric:        Mood and Affect: Mood normal.        Thought Content: Thought content normal.        Judgment: Judgment normal.     Results for orders placed or performed in visit on 12/04/18  CBC with Differential/Platelet  Result Value Ref Range   WBC 4.2 3.4 - 10.8 x10E3/uL   RBC 4.90 3.77 - 5.28 x10E6/uL   Hemoglobin 14.1 11.1 - 15.9 g/dL   Hematocrit 43.1 34.0 - 46.6 %   MCV 88 79 - 97 fL   MCH 28.8 26.6 - 33.0 pg   MCHC 32.7 31.5 - 35.7 g/dL   RDW 13.1 11.7 - 15.4 %   Platelets 226 150 - 450 x10E3/uL   Neutrophils 56 Not Estab. %   Lymphs 26 Not Estab. %   Monocytes 10 Not Estab. %   Eos 7 Not Estab. %    Basos 1 Not Estab. %   Neutrophils Absolute 2.4 1.4 - 7.0 x10E3/uL   Lymphocytes Absolute 1.1 0.7 - 3.1 x10E3/uL   Monocytes Absolute 0.4 0.1 - 0.9 x10E3/uL   EOS (ABSOLUTE) 0.3 0.0 - 0.4 x10E3/uL   Basophils Absolute 0.0 0.0 - 0.2 x10E3/uL   Immature Granulocytes 0 Not Estab. %   Immature Grans (Abs) 0.0 0.0 - 0.1 x10E3/uL  Comprehensive metabolic panel  Result Value Ref Range   Glucose 94 65 - 99 mg/dL   BUN 26 8 - 27 mg/dL   Creatinine, Ser 0.81 0.57 - 1.00 mg/dL   GFR calc non Af Amer 75 >59 mL/min/1.73   GFR calc Af Amer 87 >59 mL/min/1.73   BUN/Creatinine Ratio 32 (H) 12 - 28   Sodium 140 134 - 144 mmol/L   Potassium 4.5 3.5 - 5.2 mmol/L   Chloride 104 96 - 106 mmol/L   CO2 22 20 - 29 mmol/L   Calcium 9.4 8.7 - 10.3 mg/dL   Total Protein 5.6 (L) 6.0 - 8.5 g/dL   Albumin 4.0 3.8 - 4.8 g/dL   Globulin, Total 1.6 1.5 - 4.5 g/dL   Albumin/Globulin Ratio 2.5 (H) 1.2 - 2.2   Bilirubin Total 0.4 0.0 - 1.2 mg/dL   Alkaline Phosphatase 102 39 - 117 IU/L   AST 19 0 - 40 IU/L   ALT 14 0 - 32 IU/L  Lipid Panel w/o Chol/HDL Ratio  Result Value Ref Range   Cholesterol, Total 167 100 - 199 mg/dL   Triglycerides 58 0 - 149 mg/dL   HDL 78 >39 mg/dL   VLDL Cholesterol Cal 12 5 - 40 mg/dL   LDL Calculated 77 0 - 99 mg/dL  Microalbumin, Urine Waived  Result Value Ref Range   Microalb, Ur Waived 10 0 - 19 mg/L   Creatinine, Urine Waived 100 10 - 300 mg/dL   Microalb/Creat Ratio <30 <30 mg/g  TSH  Result Value Ref Range   TSH 4.290 0.450 - 4.500 uIU/mL  UA/M w/rflx Culture, Routine   Specimen: Urine   URINE  Result Value Ref Range   Specific Gravity, UA 1.025 1.005 - 1.030   pH, UA 5.0 5.0 - 7.5   Color, UA Yellow Yellow  Appearance Ur Clear Clear   Leukocytes, UA Negative Negative   Protein, UA Negative Negative/Trace   Glucose, UA Negative Negative   Ketones, UA Negative Negative   RBC, UA Negative Negative   Bilirubin, UA Negative Negative   Urobilinogen, Ur 0.2 0.2 -  1.0 mg/dL   Nitrite, UA Negative Negative      Assessment & Plan:   Problem List Items Addressed This Visit      Other   Depression, recurrent ([place])    Reviewed numerous options, pt wanting to try zoloft. Risks and side effects reviewed, has f/u already scheduled for next month and will f/u on this at that time      Relevant Medications   sertraline (ZOLOFT) 50 MG tablet    Other Visit Diagnoses    Right arm pain    -  Primary   From dog bite to forearm. Healing well w/o complication but will send augmentin in case worsening (infection signs and sxs reviewed). Rabies vaccine reviewed       Follow up plan: Return for as scheduled.

## 2020-03-23 NOTE — Telephone Encounter (Signed)
Copied from [place] [id number]. Topic: Quick Communication - Rx Refill/Question >> Mar 23, 2020 12:23 PM Erick Blinks wrote: Medication: sertraline (ZOLOFT) 50 MG tablet  Christy called from Pharmacy reporting that they have yet to receive refill request. The e-prescribing status is incomplete. It states that the transmission to pharmacy is progress   Has the patient contacted their pharmacy? Yes.   (Agent: If no, request that the patient contact the pharmacy for the refill.) (Agent: If yes, when and what did the pharmacy advise?)  Preferred Pharmacy (with phone number or street name): [place], [place] - [address] [place] [address] Phone: [phone] Fax: [phone]    Agent: Please be advised that RX refills may take up to 3 business days. We ask that you follow-up with your pharmacy.

## 2020-03-23 NOTE — Patient Instructions (Signed)
Please call at this number ([facility]) to schedule your mammogram. [phone] 

## 2020-03-23 NOTE — Telephone Encounter (Signed)
Medication called in to pharmacy.

## 2020-03-24 DIAGNOSIS — F339 Major depressive disorder, recurrent, unspecified: Secondary | ICD-10-CM | POA: Insufficient documentation

## 2020-03-24 NOTE — Assessment & Plan Note (Signed)
Reviewed numerous options, pt wanting to try zoloft. Risks and side effects reviewed, has f/u already scheduled for next month and will f/u on this at that time

## 2020-04-05 ENCOUNTER — Encounter: Payer: Self-pay | Admitting: Family Medicine

## 2020-04-08 ENCOUNTER — Telehealth: Payer: Self-pay | Admitting: Family Medicine

## 2020-04-08 ENCOUNTER — Ambulatory Visit (INDEPENDENT_AMBULATORY_CARE_PROVIDER_SITE_OTHER): Payer: PPO | Admitting: Family Medicine

## 2020-04-08 ENCOUNTER — Other Ambulatory Visit: Payer: Self-pay

## 2020-04-08 ENCOUNTER — Encounter: Payer: Self-pay | Admitting: Family Medicine

## 2020-04-08 VITALS — BP 156/92 | HR 66 | Temp 97.9°F | Ht 68.0 in | Wt 213.8 lb

## 2020-04-08 DIAGNOSIS — R5382 Chronic fatigue, unspecified: Secondary | ICD-10-CM | POA: Diagnosis not present

## 2020-04-08 DIAGNOSIS — Z13220 Encounter for screening for lipoid disorders: Secondary | ICD-10-CM

## 2020-04-08 DIAGNOSIS — F5081 Binge eating disorder: Secondary | ICD-10-CM

## 2020-04-08 DIAGNOSIS — R030 Elevated blood-pressure reading, without diagnosis of hypertension: Secondary | ICD-10-CM

## 2020-04-08 DIAGNOSIS — D509 Iron deficiency anemia, unspecified: Secondary | ICD-10-CM | POA: Diagnosis not present

## 2020-04-08 DIAGNOSIS — F339 Major depressive disorder, recurrent, unspecified: Secondary | ICD-10-CM | POA: Diagnosis not present

## 2020-04-08 DIAGNOSIS — K219 Gastro-esophageal reflux disease without esophagitis: Secondary | ICD-10-CM | POA: Diagnosis not present

## 2020-04-08 LAB — URINALYSIS, ROUTINE W REFLEX MICROSCOPIC
Bilirubin, UA: NEGATIVE
Glucose, UA: NEGATIVE
Ketones, UA: NEGATIVE
Leukocytes,UA: NEGATIVE
Nitrite, UA: NEGATIVE
Protein,UA: NEGATIVE
RBC, UA: NEGATIVE
Specific Gravity, UA: >1.03 — ABNORMAL HIGH (ref 1.005–1.030)
Urobilinogen, Ur: 0.2 mg/dL (ref 0.2–1.0)
pH, UA: 5 (ref 5.0–7.5)

## 2020-04-08 LAB — MICROALBUMIN, URINE WAIVED
Creatinine, Urine Waived: 300 mg/dL (ref 10–300)
Microalb, Ur Waived: 80 mg/L — ABNORMAL HIGH (ref 0–19)
Microalb/Creat Ratio: <30 mg/g (ref <30)

## 2020-04-08 MED ORDER — OMEPRAZOLE 20 MG PO CPDR: 20.0000 mg | DELAYED_RELEASE_CAPSULE | Freq: Every day | ORAL | 3 refills | Status: DC

## 2020-04-08 MED ORDER — LISDEXAMFETAMINE DIMESYLATE 60 MG PO CAPS
60.0000 mg | ORAL_CAPSULE | ORAL | 0 refills | Status: DC
Start: 2020-06-07 — End: 2020-06-18

## 2020-04-08 MED ORDER — SERTRALINE HCL 50 MG PO TABS: 50.0000 mg | ORAL_TABLET | Freq: Every day | ORAL | 1 refills | Status: DC

## 2020-04-08 MED ORDER — LISDEXAMFETAMINE DIMESYLATE 60 MG PO CAPS: 60.0000 mg | ORAL_CAPSULE | ORAL | 0 refills | Status: DC

## 2020-04-08 MED ORDER — LISDEXAMFETAMINE DIMESYLATE 60 MG PO CAPS
60.0000 mg | ORAL_CAPSULE | ORAL | 0 refills | Status: DC
Start: 2020-05-08 — End: 2020-06-18

## 2020-04-08 NOTE — Telephone Encounter (Signed)
It will be sent in, I've been seeing patients and have not had a chance to send it

## 2020-04-08 NOTE — Telephone Encounter (Signed)
Copied from [place] [phone]. Topic: General - Inquiry >> Apr 08, 2020  9:52 AM Greggory Keen D wrote: Reason for CRM: Pt called saying she was in the office this morning and seen Dr. Wynetta Emery.  She said when she got to the pharmacy there was no prescription for the Vyvanse  there.  She said there was supposed to be three separate prescriptions for 30 days each for the Vyvanse.  She did get the other prescriptions for the other drugs.  She uses [facility].

## 2020-04-08 NOTE — Telephone Encounter (Signed)
Meds should be at pharmacy now

## 2020-04-08 NOTE — Assessment & Plan Note (Signed)
Not doing well. Will start omeprazole and recheck at follow up. Continue PRN pepcid. Call with any concerns.

## 2020-04-08 NOTE — Telephone Encounter (Signed)
Patient notified

## 2020-04-08 NOTE — Assessment & Plan Note (Signed)
Still not under great control. Only started her medicine about 2 weeks ago. If no improvement in the next 2 weeks, can increase zoloft to 100mg . Recheck 6-8 weeks.

## 2020-04-08 NOTE — Telephone Encounter (Signed)
Copied from [place] [id number]. Topic: General - Inquiry >> Apr 08, 2020  9:52 AM Greggory Keen D wrote: Reason for CRM: Pt called saying she was in the office this morning and seen Dr. Wynetta Emery.  She said when she got to the pharmacy there was no prescription for the Vyvanse  there.  She said there was supposed to be three separate prescriptions for 30 days each for the Vyvanse.  She did get the other prescriptions for the other drugs.  She uses [facility].

## 2020-04-08 NOTE — Assessment & Plan Note (Signed)
Stable. Weight stable. Continue current regimen. Refills for 3 months given today. Call with any concerns.

## 2020-04-08 NOTE — Progress Notes (Signed)
BP (!) 156/92 (BP Location: Left Arm, Cuff Size: Normal)   Pulse 66   Temp 97.9 F (36.6 C) (Oral)   Ht 5\' 8"  (1.727 m)   Wt 213 lb 12.8 oz (97 kg)   SpO2 100%   BMI 32.51 kg/m    Subjective:    Patient ID: Mckenzie Webster, female    DOB: [birth year], 69 y.o.   MRN: [id number]  HPI: Mckenzie Webster is a 69 y.o. female  Chief Complaint  Patient presents with  . Binge Eating Disorder   Continues to tolerate the vyvanse well. It has been helping a bit with the binge eating- although she has been doing a little worse due to a lot of stress at home with a neighbor. No side effects. Generally feeling well on it.   ELEVATED BLOOD PRESSURE Duration of elevated BP: chronic BP monitoring frequency: not checking Previous BP meds: no Recent stressors: yes Family history of hypertension: yes Recurrent headaches: no Visual changes: no Palpitations: no  Dyspnea: no Chest pain: no Lower extremity edema: no Dizzy/lightheaded: no Transient ischemic attacks: no  DEPRESSION- has been under an extreme amount of stress with her neighbor. Mood status: stable Satisfied with current treatment?: unsure just started it Symptom severity: moderate  Duration of current treatment : chronic Side effects: no Medication compliance: excellent compliance Psychotherapy/counseling: no  Previous psychiatric medications: zoloft Depressed mood: yes Anxious mood: no Anhedonia: no Significant weight loss or gain: no Insomnia: no  Fatigue: yes Feelings of worthlessness or guilt: no Impaired concentration/indecisiveness: no Suicidal ideations: no Hopelessness: no Crying spells: no Depression screen [facility] 2/9 04/08/2020 03/22/2020 12/29/2019 09/09/2019 12/04/2018  Decreased Interest 1 0 0 0 0  Down, Depressed, Hopeless 1 0 0 0 2  PHQ - 2 Score 2 0 0 0 2  Altered sleeping 1 - 0 0 1  Tired, decreased energy 3 - 0 0 1  Change in appetite 2 - 3 3 1   Feeling bad or failure about yourself  1 - 0 0 1  Trouble  concentrating 1 - 0 0 0  Moving slowly or fidgety/restless 1 - 0 0 0  Suicidal thoughts 0 - 0 0 0  PHQ-9 Score 11 - 3 3 6   Difficult doing work/chores Very difficult - Not difficult at all Not difficult at all Somewhat difficult  Some recent data might be hidden   GERD GERD control status: exacerbated  Satisfied with current treatment? no Heartburn frequency: nearly daily Medication side effects: no  Medication compliance: excellent Dysphagia: no Odynophagia:  no Hematemesis: no Blood in stool: no EGD: no  Relevant past medical, surgical, family and social history reviewed and updated as indicated. Interim medical history since our last visit reviewed. Allergies and medications reviewed and updated.  Review of Systems  Constitutional: Positive for fatigue. Negative for activity change, appetite change, chills, diaphoresis, fever and unexpected weight change.  HENT: Negative.   Respiratory: Negative.   Cardiovascular: Negative.   Gastrointestinal: Positive for abdominal pain. Negative for abdominal distention, anal bleeding, blood in stool, constipation, diarrhea, nausea, rectal pain and vomiting.  Genitourinary: Negative.   Musculoskeletal: Negative.   Skin: Negative.   Psychiatric/Behavioral: Positive for dysphoric mood. Negative for agitation, behavioral problems, confusion, decreased concentration, hallucinations, self-injury, sleep disturbance and suicidal ideas. The patient is nervous/anxious. The patient is not hyperactive.     Per HPI unless specifically indicated above     Objective:    BP (!) 156/92 (BP Location: Left Arm, Cuff Size: Normal)  Pulse 66   Temp 97.9 F (36.6 C) (Oral)   Ht 5\' 8"  (1.727 m)   Wt 213 lb 12.8 oz (97 kg)   SpO2 100%   BMI 32.51 kg/m   Wt Readings from Last 3 Encounters:  04/08/20 213 lb 12.8 oz (97 kg)  03/23/20 215 lb (97.5 kg)  03/11/19 215 lb (97.5 kg)    Physical Exam Vitals and nursing note reviewed.  Constitutional:       General: She is not in acute distress.    Appearance: Normal appearance. She is not ill-appearing, toxic-appearing or diaphoretic.  HENT:     Head: Normocephalic and atraumatic.     Right Ear: External ear normal.     Left Ear: External ear normal.     Nose: Nose normal.     Mouth/Throat:     Mouth: Mucous membranes are moist.     Pharynx: Oropharynx is clear.  Eyes:     General: No scleral icterus.       Right eye: No discharge.        Left eye: No discharge.     Extraocular Movements: Extraocular movements intact.     Conjunctiva/sclera: Conjunctivae normal.     Pupils: Pupils are equal, round, and reactive to light.  Cardiovascular:     Rate and Rhythm: Normal rate and regular rhythm.     Pulses: Normal pulses.     Heart sounds: Normal heart sounds. No murmur heard.  No friction rub. No gallop.   Pulmonary:     Effort: Pulmonary effort is normal. No respiratory distress.     Breath sounds: Normal breath sounds. No stridor. No wheezing, rhonchi or rales.  Chest:     Chest wall: No tenderness.  Musculoskeletal:        General: Normal range of motion.     Cervical back: Normal range of motion and neck supple.  Skin:    General: Skin is warm and dry.     Capillary Refill: Capillary refill takes less than 2 seconds.     Coloration: Skin is not jaundiced or pale.     Findings: No bruising, erythema, lesion or rash.  Neurological:     General: No focal deficit present.     Mental Status: She is alert and oriented to person, place, and time. Mental status is at baseline.  Psychiatric:        Mood and Affect: Mood normal.        Behavior: Behavior normal.        Thought Content: Thought content normal.        Judgment: Judgment normal.     Results for orders placed or performed in visit on 12/04/18  CBC with Differential/Platelet  Result Value Ref Range   WBC 4.2 3.4 - 10.8 x10E3/uL   RBC 4.90 3.77 - 5.28 x10E6/uL   Hemoglobin 14.1 11.1 - 15.9 g/dL   Hematocrit 43.1  34.0 - 46.6 %   MCV 88 79 - 97 fL   MCH 28.8 26.6 - 33.0 pg   MCHC 32.7 31 - 35 g/dL   RDW 13.1 11.7 - 15.4 %   Platelets 226 150 - 450 x10E3/uL   Neutrophils 56 Not Estab. %   Lymphs 26 Not Estab. %   Monocytes 10 Not Estab. %   Eos 7 Not Estab. %   Basos 1 Not Estab. %   Neutrophils Absolute 2.4 1 - 7 x10E3/uL   Lymphocytes Absolute 1.1 0 - 3 x10E3/uL  Monocytes Absolute 0.4 0 - 0 x10E3/uL   EOS (ABSOLUTE) 0.3 0.0 - 0.4 x10E3/uL   Basophils Absolute 0.0 0 - 0 x10E3/uL   Immature Granulocytes 0 Not Estab. %   Immature Grans (Abs) 0.0 0.0 - 0.1 x10E3/uL  Comprehensive metabolic panel  Result Value Ref Range   Glucose 94 65 - 99 mg/dL   BUN 26 8 - 27 mg/dL   Creatinine, Ser 0.81 0.57 - 1.00 mg/dL   GFR calc non Af Amer 75 >59 mL/min/1.73   GFR calc Af Amer 87 >59 mL/min/1.73   BUN/Creatinine Ratio 32 (H) 12 - 28   Sodium 140 134 - 144 mmol/L   Potassium 4.5 3.5 - 5.2 mmol/L   Chloride 104 96 - 106 mmol/L   CO2 22 20 - 29 mmol/L   Calcium 9.4 8.7 - 10.3 mg/dL   Total Protein 5.6 (L) 6.0 - 8.5 g/dL   Albumin 4.0 3.8 - 4.8 g/dL   Globulin, Total 1.6 1.5 - 4.5 g/dL   Albumin/Globulin Ratio 2.5 (H) 1.2 - 2.2   Bilirubin Total 0.4 0.0 - 1.2 mg/dL   Alkaline Phosphatase 102 39 - 117 IU/L   AST 19 0 - 40 IU/L   ALT 14 0 - 32 IU/L  Lipid Panel w/o Chol/HDL Ratio  Result Value Ref Range   Cholesterol, Total 167 100 - 199 mg/dL   Triglycerides 58 0 - 149 mg/dL   HDL 78 >39 mg/dL   VLDL Cholesterol Cal 12 5 - 40 mg/dL   LDL Calculated 77 0 - 99 mg/dL  Microalbumin, Urine Waived  Result Value Ref Range   Microalb, Ur Waived 10 0 - 19 mg/L   Creatinine, Urine Waived 100 10 - 300 mg/dL   Microalb/Creat Ratio <30 <30 mg/g  TSH  Result Value Ref Range   TSH 4.290 0.450 - 4.500 uIU/mL  UA/M w/rflx Culture, Routine   Specimen: Urine   URINE  Result Value Ref Range   Specific Gravity, UA 1.025 1.005 - 1.030   pH, UA 5.0 5.0 - 7.5   Color, UA Yellow Yellow   Appearance Ur  Clear Clear   Leukocytes, UA Negative Negative   Protein, UA Negative Negative/Trace   Glucose, UA Negative Negative   Ketones, UA Negative Negative   RBC, UA Negative Negative   Bilirubin, UA Negative Negative   Urobilinogen, Ur 0.2 0.2 - 1.0 mg/dL   Nitrite, UA Negative Negative      Assessment & Plan:   Problem List Items Addressed This Visit      Digestive   GERD without esophagitis    Not doing well. Will start omeprazole and recheck at follow up. Continue PRN pepcid. Call with any concerns.       Relevant Medications   omeprazole (PRILOSEC) 20 MG capsule     Other   Iron deficiency anemia - Primary    Feeling really tired again like she was when she had anemia before. Will recheck labs. Await results. Treat as needed. Has been taking her iron.       Relevant Orders   CBC with Differential/Platelet   Comprehensive metabolic panel   Iron and TIBC   Ferritin   Binge eating disorder    Stable. Weight stable. Continue current regimen. Refills for 3 months given today. Call with any concerns.       Relevant Orders   Comprehensive metabolic panel   Depression, recurrent ([place])    Still not under great control. Only started her medicine  about 2 weeks ago. If no improvement in the next 2 weeks, can increase zoloft to 100mg . Recheck 6-8 weeks.       Relevant Medications   sertraline (ZOLOFT) 50 MG tablet   Other Relevant Orders   Comprehensive metabolic panel   TSH    Other Visit Diagnoses    Elevated blood pressure reading       Will work on [employer]. Continue to monitor. Work on stress. Recheck in 1-2 months.    Relevant Orders   Microalbumin, Urine Waived   Comprehensive metabolic panel   Urinalysis, Routine w reflex microscopic   Chronic fatigue       Possibly multi-factorial. We will check labs. Await results.    Relevant Orders   CBC with Differential/Platelet   Comprehensive metabolic panel   TSH   Screening for cholesterol level       Labs drawn  today. Await results.    Relevant Orders   Comprehensive metabolic panel   Lipid Panel w/o Chol/HDL Ratio       Follow up plan: Return 6-8 weeks follow up mood and BP.

## 2020-04-08 NOTE — Assessment & Plan Note (Signed)
Feeling really tired again like she was when she had anemia before. Will recheck labs. Await results. Treat as needed. Has been taking her iron.

## 2020-04-08 NOTE — Patient Instructions (Signed)
DASH Eating Plan DASH stands for "Dietary Approaches to Stop Hypertension." The DASH eating plan is a healthy eating plan that has been shown to reduce high blood pressure (hypertension). It may also reduce your risk for type 2 diabetes, heart disease, and stroke. The DASH eating plan may also help with weight loss. What are tips for following this plan?  General guidelines  Avoid eating more than 2,300 mg (milligrams) of salt (sodium) a day. If you have hypertension, you may need to reduce your sodium intake to 1,500 mg a day.  Limit alcohol intake to no more than 1 drink a day for nonpregnant women and 2 drinks a day for men. One drink equals 12 oz of beer, 5 oz of wine, or 1 oz of hard liquor.  Work with your health care provider to maintain a healthy body weight or to lose weight. Ask what an ideal weight is for you.  Get at least 30 minutes of exercise that causes your heart to beat faster (aerobic exercise) most days of the week. Activities may include walking, swimming, or biking.  Work with your health care provider or diet and nutrition specialist (dietitian) to adjust your eating plan to your individual calorie needs. Reading food labels   Check food labels for the amount of sodium per serving. Choose foods with less than 5 percent of the Daily Value of sodium. Generally, foods with less than 300 mg of sodium per serving fit into this eating plan.  To find whole grains, look for the word "whole" as the first word in the ingredient list. Shopping  Buy products labeled as "low-sodium" or "no salt added."  Buy fresh foods. Avoid canned foods and premade or frozen meals. Cooking  Avoid adding salt when cooking. Use salt-free seasonings or herbs instead of table salt or sea salt. Check with your health care provider or pharmacist before using salt substitutes.  Do not fry foods. Cook foods using healthy methods such as baking, boiling, grilling, and broiling instead.  Cook with  heart-healthy oils, such as olive, canola, soybean, or sunflower oil. Meal planning  Eat a balanced diet that includes: ? 5 or more servings of fruits and vegetables each day. At each meal, try to fill half of your plate with fruits and vegetables. ? Up to 6-8 servings of whole grains each day. ? Less than 6 oz of lean meat, poultry, or fish each day. A 3-oz serving of meat is about the same size as a deck of cards. One egg equals 1 oz. ? 2 servings of low-fat dairy each day. ? A serving of nuts, seeds, or beans 5 times each week. ? Heart-healthy fats. Healthy fats called Omega-3 fatty acids are found in foods such as flaxseeds and coldwater fish, like sardines, salmon, and mackerel.  Limit how much you eat of the following: ? Canned or prepackaged foods. ? Food that is high in trans fat, such as fried foods. ? Food that is high in saturated fat, such as fatty meat. ? Sweets, desserts, sugary drinks, and other foods with added sugar. ? Full-fat dairy products.  Do not salt foods before eating.  Try to eat at least 2 vegetarian meals each week.  Eat more home-cooked food and less restaurant, buffet, and fast food.  When eating at a restaurant, ask that your food be prepared with less salt or no salt, if possible. What foods are recommended? The items listed may not be a complete list. Talk with your dietitian about   what dietary choices are best for you. Grains Whole-grain or whole-wheat bread. Whole-grain or whole-wheat pasta. Brown rice. Oatmeal. Quinoa. Bulgur. Whole-grain and low-sodium cereals. Pita bread. Low-fat, low-sodium crackers. Whole-wheat flour tortillas. Vegetables Fresh or frozen vegetables (raw, steamed, roasted, or grilled). Low-sodium or reduced-sodium tomato and vegetable juice. Low-sodium or reduced-sodium tomato sauce and tomato paste. Low-sodium or reduced-sodium canned vegetables. Fruits All fresh, dried, or frozen fruit. Canned fruit in natural juice (without  added sugar). Meat and other protein foods Skinless chicken or turkey. Ground chicken or turkey. Pork with fat trimmed off. Fish and seafood. Egg whites. Dried beans, peas, or lentils. Unsalted nuts, nut butters, and seeds. Unsalted canned beans. Lean cuts of beef with fat trimmed off. Low-sodium, lean deli meat. Dairy Low-fat (1%) or fat-free (skim) milk. Fat-free, low-fat, or reduced-fat cheeses. Nonfat, low-sodium ricotta or cottage cheese. Low-fat or nonfat yogurt. Low-fat, low-sodium cheese. Fats and oils Soft margarine without trans fats. Vegetable oil. Low-fat, reduced-fat, or light mayonnaise and salad dressings (reduced-sodium). Canola, safflower, olive, soybean, and sunflower oils. Avocado. Seasoning and other foods Herbs. Spices. Seasoning mixes without salt. Unsalted popcorn and pretzels. Fat-free sweets. What foods are not recommended? The items listed may not be a complete list. Talk with your dietitian about what dietary choices are best for you. Grains Baked goods made with fat, such as croissants, muffins, or some breads. Dry pasta or rice meal packs. Vegetables Creamed or fried vegetables. Vegetables in a cheese sauce. Regular canned vegetables (not low-sodium or reduced-sodium). Regular canned tomato sauce and paste (not low-sodium or reduced-sodium). Regular tomato and vegetable juice (not low-sodium or reduced-sodium). Pickles. Olives. Fruits Canned fruit in a light or heavy syrup. Fried fruit. Fruit in cream or butter sauce. Meat and other protein foods Fatty cuts of meat. Ribs. Fried meat. Bacon. Sausage. Bologna and other processed lunch meats. Salami. Fatback. Hotdogs. Bratwurst. Salted nuts and seeds. Canned beans with added salt. Canned or smoked fish. Whole eggs or egg yolks. Chicken or turkey with skin. Dairy Whole or 2% milk, cream, and half-and-half. Whole or full-fat cream cheese. Whole-fat or sweetened yogurt. Full-fat cheese. Nondairy creamers. Whipped toppings.  Processed cheese and cheese spreads. Fats and oils Butter. Stick margarine. Lard. Shortening. Ghee. Bacon fat. Tropical oils, such as coconut, palm kernel, or palm oil. Seasoning and other foods Salted popcorn and pretzels. Onion salt, garlic salt, seasoned salt, table salt, and sea salt. Worcestershire sauce. Tartar sauce. Barbecue sauce. Teriyaki sauce. Soy sauce, including reduced-sodium. Steak sauce. Canned and packaged gravies. Fish sauce. Oyster sauce. Cocktail sauce. Horseradish that you find on the shelf. Ketchup. Mustard. Meat flavorings and tenderizers. Bouillon cubes. Hot sauce and Tabasco sauce. Premade or packaged marinades. Premade or packaged taco seasonings. Relishes. Regular salad dressings. Where to find more information:  National Heart, Lung, and Blood Institute: www.nhlbi.nih.gov  American Heart Association: www.heart.org Summary  The DASH eating plan is a healthy eating plan that has been shown to reduce high blood pressure (hypertension). It may also reduce your risk for type 2 diabetes, heart disease, and stroke.  With the DASH eating plan, you should limit salt (sodium) intake to 2,300 mg a day. If you have hypertension, you may need to reduce your sodium intake to 1,500 mg a day.  When on the DASH eating plan, aim to eat more fresh fruits and vegetables, whole grains, lean proteins, low-fat dairy, and heart-healthy fats.  Work with your health care provider or diet and nutrition specialist (dietitian) to adjust your eating plan to your   individual calorie needs. This information is not intended to replace advice given to you by your health care provider. Make sure you discuss any questions you have with your health care provider. Document Revised: 09/28/2017 Document Reviewed: 10/09/2016 Elsevier Patient Education  2020 Elsevier Inc.  

## 2020-04-08 NOTE — Telephone Encounter (Signed)
Called and left a message for patient letting her know that her medication should be at the pharmacy now.

## 2020-04-08 NOTE — Addendum Note (Signed)
Addended by: Valerie Roys on: 04/08/2020 10:55 AM   Modules accepted: Orders

## 2020-04-09 ENCOUNTER — Encounter: Payer: Self-pay | Admitting: Family Medicine

## 2020-04-09 LAB — COMPREHENSIVE METABOLIC PANEL
ALT: 12 IU/L (ref 0–32)
AST: 19 IU/L (ref 0–40)
Albumin/Globulin Ratio: 2.1 (ref 1.2–2.2)
Albumin: 4.2 g/dL (ref 3.8–4.8)
Alkaline Phosphatase: 103 IU/L (ref 48–121)
BUN/Creatinine Ratio: 31 — ABNORMAL HIGH (ref 12–28)
BUN: 25 mg/dL (ref 8–27)
Bilirubin Total: 0.5 mg/dL (ref 0.0–1.2)
CO2: 21 mmol/L (ref 20–29)
Calcium: 9 mg/dL (ref 8.7–10.3)
Chloride: 106 mmol/L (ref 96–106)
Creatinine, Ser: 0.8 mg/dL (ref 0.57–1.00)
GFR calc Af Amer: 88 mL/min/{1.73_m2} (ref >59)
GFR calc non Af Amer: 76 mL/min/{1.73_m2} (ref >59)
Globulin, Total: 2 g/dL (ref 1.5–4.5)
Glucose: 83 mg/dL (ref 65–99)
Potassium: 4.2 mmol/L (ref 3.5–5.2)
Sodium: 139 mmol/L (ref 134–144)
Total Protein: 6.2 g/dL (ref 6.0–8.5)

## 2020-04-09 LAB — LIPID PANEL W/O CHOL/HDL RATIO
Cholesterol, Total: 172 mg/dL (ref 100–199)
HDL: 76 mg/dL (ref >39)
LDL Chol Calc (NIH): 84 mg/dL (ref 0–99)
Triglycerides: 63 mg/dL (ref 0–149)
VLDL Cholesterol Cal: 12 mg/dL (ref 5–40)

## 2020-04-09 LAB — CBC WITH DIFFERENTIAL/PLATELET
Basophils Absolute: 0 10*3/uL (ref 0.0–0.2)
Basos: 1 %
EOS (ABSOLUTE): 0.2 10*3/uL (ref 0.0–0.4)
Eos: 3 %
Hematocrit: 44.5 % (ref 34.0–46.6)
Hemoglobin: 15.1 g/dL (ref 11.1–15.9)
Immature Grans (Abs): 0 10*3/uL (ref 0.0–0.1)
Immature Granulocytes: 0 %
Lymphocytes Absolute: 0.8 10*3/uL (ref 0.7–3.1)
Lymphs: 15 %
MCH: 29.7 pg (ref 26.6–33.0)
MCHC: 33.9 g/dL (ref 31.5–35.7)
MCV: 87 fL (ref 79–97)
Monocytes Absolute: 0.5 10*3/uL (ref 0.1–0.9)
Monocytes: 9 %
Neutrophils Absolute: 3.6 10*3/uL (ref 1.4–7.0)
Neutrophils: 72 %
Platelets: 223 10*3/uL (ref 150–450)
RBC: 5.09 x10E6/uL (ref 3.77–5.28)
RDW: 12.9 % (ref 11.7–15.4)
WBC: 5.1 10*3/uL (ref 3.4–10.8)

## 2020-04-09 LAB — TSH: TSH: 4.06 u[IU]/mL (ref 0.450–4.500)

## 2020-04-09 LAB — IRON AND TIBC
Iron Saturation: 33 % (ref 15–55)
Iron: 89 ug/dL (ref 27–139)
Total Iron Binding Capacity: 269 ug/dL (ref 250–450)
UIBC: 180 ug/dL (ref 118–369)

## 2020-04-09 LAB — FERRITIN: Ferritin: 74 ng/mL (ref 15–150)

## 2020-04-15 ENCOUNTER — Ambulatory Visit: Payer: PPO | Admitting: Family Medicine

## 2020-04-16 ENCOUNTER — Ambulatory Visit: Payer: PPO | Admitting: Family Medicine

## 2020-05-12 ENCOUNTER — Other Ambulatory Visit: Payer: Self-pay | Admitting: Family Medicine

## 2020-05-12 NOTE — Telephone Encounter (Signed)
Next OV 05/21/20. Approving one month of medication at this time. Requested Prescriptions  Pending Prescriptions Disp Refills   sertraline (ZOLOFT) 50 MG tablet [Pharmacy Med Name: SERTRALINE HCL 50 MG TAB] 60 tablet 0    Sig: TAKE 1-2 TABLETS BY MOUTH DAILY     Psychiatry:  Antidepressants - SSRI Passed - 05/12/2020  8:56 AM      Passed - Completed PHQ-2 or PHQ-9 in the last 360 days.      Passed - Valid encounter within last 6 months    Recent Outpatient Visits          1 month ago Iron deficiency anemia, unspecified iron deficiency anemia type   Winger, Megan P, DO   1 month ago Right arm pain   [facility] Merrie Roof [place], [place]   4 months ago Binge eating disorder   [place], [place], DO   8 months ago Binge eating disorder   [place], [place], DO   11 months ago Binge eating disorder   [facility] [place], Barb Merino, DO      Future Appointments            In 1 week Wynetta Emery, Barb Merino, DO [employer], [facility]

## 2020-05-21 ENCOUNTER — Encounter: Payer: Self-pay | Admitting: Family Medicine

## 2020-05-21 ENCOUNTER — Other Ambulatory Visit: Payer: Self-pay

## 2020-05-21 ENCOUNTER — Ambulatory Visit (INDEPENDENT_AMBULATORY_CARE_PROVIDER_SITE_OTHER): Payer: PPO | Admitting: Family Medicine

## 2020-05-21 VITALS — BP 134/80 | HR 69 | Temp 98.7°F | Wt 216.6 lb

## 2020-05-21 DIAGNOSIS — F339 Major depressive disorder, recurrent, unspecified: Secondary | ICD-10-CM

## 2020-05-21 DIAGNOSIS — R030 Elevated blood-pressure reading, without diagnosis of hypertension: Secondary | ICD-10-CM

## 2020-05-21 MED ORDER — FLUOXETINE HCL 20 MG PO CAPS
ORAL_CAPSULE | ORAL | 3 refills | Status: DC
Start: 2020-05-21 — End: 2020-06-18

## 2020-05-21 MED ORDER — NAPROXEN 500 MG PO TBEC: 500.0000 mg | DELAYED_RELEASE_TABLET | Freq: Two times a day (BID) | ORAL | 3 refills | Status: DC

## 2020-05-21 NOTE — Patient Instructions (Signed)
Schedule Colonoscopy with [facility] Jonathon Bellows MD [address] [place], [place] [address] [phone]

## 2020-05-21 NOTE — Progress Notes (Signed)
BP (!) 134/80 (BP Location: Left Arm, Cuff Size: Normal)   Pulse 69   Temp 98.7 F (37.1 C) (Oral)   Wt (!) 216 lb 9.6 oz (98.2 kg)   SpO2 97%   BMI 32.93 kg/m    Subjective:    Patient ID: Mckenzie Webster, female    DOB: June 10, 1951, 69 y.o.   MRN: [id number]  HPI: Mckenzie Webster is a 69 y.o. female  Chief Complaint  Patient presents with  . Hypertension  . Depression   DEPRESSION- has not noticed any difference with her medicine. She has been taking 100mg  and and still not having any interest in doing anything. She notes that she may even be feeling worse.  Mood status: uncontrolled Satisfied with current treatment?: no Symptom severity: moderate  Duration of current treatment : 1-2 months Side effects: no Medication compliance: excellent compliance Psychotherapy/counseling: no  Previous psychiatric medications: wellbutrin, effexor Depressed mood: yes Anxious mood: yes Anhedonia: no Significant weight loss or gain: no Insomnia: no  Fatigue: yes Feelings of worthlessness or guilt: yes Impaired concentration/indecisiveness: no Suicidal ideations: no Hopelessness: no Crying spells: yes Depression screen [facility] 2/9 05/21/2020 04/08/2020 03/22/2020 12/29/2019 09/09/2019  Decreased Interest 1 1 0 0 0  Down, Depressed, Hopeless 1 1 0 0 0  PHQ - 2 Score 2 2 0 0 0  Altered sleeping 2 1 - 0 0  Tired, decreased energy 1 3 - 0 0  Change in appetite 2 2 - 3 3  Feeling bad or failure about yourself  2 1 - 0 0  Trouble concentrating 0 1 - 0 0  Moving slowly or fidgety/restless 0 1 - 0 0  Suicidal thoughts 0 0 - 0 0  PHQ-9 Score 9 11 - 3 3  Difficult doing work/chores Somewhat difficult Very difficult - Not difficult at all Not difficult at all  Some recent data might be hidden     Relevant past medical, surgical, family and social history reviewed and updated as indicated. Interim medical history since our last visit reviewed. Allergies and medications reviewed and  updated.  Review of Systems  Constitutional: Negative.   Respiratory: Negative.   Cardiovascular: Negative.   Gastrointestinal: Negative.   Skin: Negative.   Psychiatric/Behavioral: Positive for decreased concentration and dysphoric mood. Negative for agitation, behavioral problems, confusion, hallucinations, self-injury, sleep disturbance and suicidal ideas. The patient is nervous/anxious. The patient is not hyperactive.     Per HPI unless specifically indicated above     Objective:    BP (!) 134/80 (BP Location: Left Arm, Cuff Size: Normal)   Pulse 69   Temp 98.7 F (37.1 C) (Oral)   Wt (!) 216 lb 9.6 oz (98.2 kg)   SpO2 97%   BMI 32.93 kg/m   Wt Readings from Last 3 Encounters:  05/21/20 (!) 216 lb 9.6 oz (98.2 kg)  04/08/20 213 lb 12.8 oz (97 kg)  03/23/20 215 lb (97.5 kg)    Physical Exam Vitals and nursing note reviewed.  Constitutional:      General: She is not in acute distress.    Appearance: Normal appearance. She is not ill-appearing, toxic-appearing or diaphoretic.  HENT:     Head: Normocephalic and atraumatic.     Right Ear: External ear normal.     Left Ear: External ear normal.     Nose: Nose normal.     Mouth/Throat:     Mouth: Mucous membranes are moist.     Pharynx: Oropharynx is clear.  Eyes:     General: No scleral icterus.       Right eye: No discharge.        Left eye: No discharge.     Extraocular Movements: Extraocular movements intact.     Conjunctiva/sclera: Conjunctivae normal.     Pupils: Pupils are equal, round, and reactive to light.  Cardiovascular:     Rate and Rhythm: Normal rate and regular rhythm.     Pulses: Normal pulses.     Heart sounds: Normal heart sounds. No murmur heard.  No friction rub. No gallop.   Pulmonary:     Effort: Pulmonary effort is normal. No respiratory distress.     Breath sounds: Normal breath sounds. No stridor. No wheezing, rhonchi or rales.  Chest:     Chest wall: No tenderness.  Musculoskeletal:         General: Normal range of motion.     Cervical back: Normal range of motion and neck supple.  Skin:    General: Skin is warm and dry.     Capillary Refill: Capillary refill takes less than 2 seconds.     Coloration: Skin is not jaundiced or pale.     Findings: No bruising, erythema, lesion or rash.  Neurological:     General: No focal deficit present.     Mental Status: She is alert and oriented to person, place, and time. Mental status is at baseline.  Psychiatric:        Mood and Affect: Mood normal.        Behavior: Behavior normal.        Thought Content: Thought content normal.        Judgment: Judgment normal.     Results for orders placed or performed in visit on 04/08/20  Microalbumin, Urine Waived  Result Value Ref Range   Microalb, Ur Waived 80 (H) 0 - 19 mg/L   Creatinine, Urine Waived 300 10 - 300 mg/dL   Microalb/Creat Ratio <30 <30 mg/g  CBC with Differential/Platelet  Result Value Ref Range   WBC 5.1 3.4 - 10.8 x10E3/uL   RBC 5.09 3.77 - 5.28 x10E6/uL   Hemoglobin 15.1 11.1 - 15.9 g/dL   Hematocrit 44.5 34.0 - 46.6 %   MCV 87 79 - 97 fL   MCH 29.7 26.6 - 33.0 pg   MCHC 33.9 31 - 35 g/dL   RDW 12.9 11.7 - 15.4 %   Platelets 223 150 - 450 x10E3/uL   Neutrophils 72 Not Estab. %   Lymphs 15 Not Estab. %   Monocytes 9 Not Estab. %   Eos 3 Not Estab. %   Basos 1 Not Estab. %   Neutrophils Absolute 3.6 1 - 7 x10E3/uL   Lymphocytes Absolute 0.8 0 - 3 x10E3/uL   Monocytes Absolute 0.5 0 - 0 x10E3/uL   EOS (ABSOLUTE) 0.2 0.0 - 0.4 x10E3/uL   Basophils Absolute 0.0 0 - 0 x10E3/uL   Immature Granulocytes 0 Not Estab. %   Immature Grans (Abs) 0.0 0.0 - 0.1 x10E3/uL  Comprehensive metabolic panel  Result Value Ref Range   Glucose 83 65 - 99 mg/dL   BUN 25 8 - 27 mg/dL   Creatinine, Ser 0.80 0.57 - 1.00 mg/dL   GFR calc non Af Amer 76 >59 mL/min/1.73   GFR calc Af Amer 88 >59 mL/min/1.73   BUN/Creatinine Ratio 31 (H) 12 - 28   Sodium 139 134 - 144 mmol/L    Potassium 4.2 3.5 - 5.2 mmol/L  Chloride 106 96 - 106 mmol/L   CO2 21 20 - 29 mmol/L   Calcium 9.0 8.7 - 10.3 mg/dL   Total Protein 6.2 6.0 - 8.5 g/dL   Albumin 4.2 3.8 - 4.8 g/dL   Globulin, Total 2.0 1.5 - 4.5 g/dL   Albumin/Globulin Ratio 2.1 1.2 - 2.2   Bilirubin Total 0.5 0.0 - 1.2 mg/dL   Alkaline Phosphatase 103 48 - 121 IU/L   AST 19 0 - 40 IU/L   ALT 12 0 - 32 IU/L  Lipid Panel w/o Chol/HDL Ratio  Result Value Ref Range   Cholesterol, Total 172 100 - 199 mg/dL   Triglycerides 63 0 - 149 mg/dL   HDL 76 >39 mg/dL   VLDL Cholesterol Cal 12 5 - 40 mg/dL   LDL Chol Calc (NIH) 84 0 - 99 mg/dL  TSH  Result Value Ref Range   TSH 4.060 0.450 - 4.500 uIU/mL  Urinalysis, Routine w reflex microscopic  Result Value Ref Range   Specific Gravity, UA >1.030 (H) 1.005 - 1.030   pH, UA 5.0 5.0 - 7.5   Color, UA Yellow Yellow   Appearance Ur Clear Clear   Leukocytes,UA Negative Negative   Protein,UA Negative Negative/Trace   Glucose, UA Negative Negative   Ketones, UA Negative Negative   RBC, UA Negative Negative   Bilirubin, UA Negative Negative   Urobilinogen, Ur 0.2 0.2 - 1.0 mg/dL   Nitrite, UA Negative Negative  Iron and TIBC  Result Value Ref Range   Total Iron Binding Capacity 269 250 - 450 ug/dL   UIBC 180 118 - 369 ug/dL   Iron 89 27 - 139 ug/dL   Iron Saturation 33 15 - 55 %  Ferritin  Result Value Ref Range   Ferritin 74 15.0 - 150.0 ng/mL      Assessment & Plan:   Problem List Items Addressed This Visit      Other   Depression, recurrent ([place]) - Primary    Still not doing well. Not noticing a difference on the zoloft. Will change to prozac and recheck 1 month. Call with any concerns. Continue to monitor.       Relevant Medications   FLUoxetine (PROZAC) 20 MG capsule    Other Visit Diagnoses    Elevated blood pressure reading       Better today. Continue DASH diet. Continue to monitor.        Follow up plan: Return in about 4 weeks (around  06/18/2020).

## 2020-05-21 NOTE — Assessment & Plan Note (Signed)
Still not doing well. Not noticing a difference on the zoloft. Will change to prozac and recheck 1 month. Call with any concerns. Continue to monitor.

## 2020-06-18 ENCOUNTER — Ambulatory Visit (INDEPENDENT_AMBULATORY_CARE_PROVIDER_SITE_OTHER): Payer: PPO | Admitting: Family Medicine

## 2020-06-18 ENCOUNTER — Encounter: Payer: Self-pay | Admitting: Family Medicine

## 2020-06-18 ENCOUNTER — Other Ambulatory Visit: Payer: Self-pay

## 2020-06-18 DIAGNOSIS — F5081 Binge eating disorder: Secondary | ICD-10-CM

## 2020-06-18 DIAGNOSIS — F339 Major depressive disorder, recurrent, unspecified: Secondary | ICD-10-CM

## 2020-06-18 MED ORDER — LISDEXAMFETAMINE DIMESYLATE 60 MG PO CAPS
60.0000 mg | ORAL_CAPSULE | ORAL | 0 refills | Status: DC
Start: 2020-08-17 — End: 2020-09-14

## 2020-06-18 MED ORDER — FLUOXETINE HCL 20 MG PO CAPS: 60.0000 mg | ORAL_CAPSULE | Freq: Every day | ORAL | 1 refills | Status: DC

## 2020-06-18 MED ORDER — LISDEXAMFETAMINE DIMESYLATE 60 MG PO CAPS: 60.0000 mg | ORAL_CAPSULE | ORAL | 0 refills | Status: DC

## 2020-06-18 MED ORDER — LISDEXAMFETAMINE DIMESYLATE 60 MG PO CAPS
60.0000 mg | ORAL_CAPSULE | ORAL | 0 refills | Status: DC
Start: 2020-07-18 — End: 2020-09-14

## 2020-06-18 NOTE — Assessment & Plan Note (Signed)
Under good control on current regimen. Continue current regimen. Continue to monitor. Call with any concerns. Refills given for 3 months. Follow up 3 months.    

## 2020-06-18 NOTE — Progress Notes (Signed)
BP 136/84   Pulse (!) 59   Temp 97.7 F (36.5 C) (Oral)   Wt 217 lb (98.4 kg)   SpO2 98%   BMI 32.99 kg/m    Subjective:    Patient ID: Mckenzie Webster, female    DOB: Nov 05, 1950, 69 y.o.   MRN: [id number]  HPI: Mckenzie Webster is a 69 y.o. female  Chief Complaint  Patient presents with  . Depression   Binge eating has actually improved since being on her prozac. Feeling better. Tolerating her medicine well. No other concerns.   DEPRESSION Mood status: better Satisfied with current treatment?: no Symptom severity: mild  Duration of current treatment : 1 month Side effects: no Medication compliance: excellent compliance Psychotherapy/counseling: no  Previous psychiatric medications: prozac Depressed mood: no Anxious mood: no Anhedonia: no Significant weight loss or gain: no Insomnia: no  Fatigue: no Feelings of worthlessness or guilt: no Impaired concentration/indecisiveness: no Suicidal ideations: no Hopelessness: no Crying spells: no Depression screen [facility] 2/9 05/21/2020 04/08/2020 03/22/2020 12/29/2019 09/09/2019  Decreased Interest 1 1 0 0 0  Down, Depressed, Hopeless 1 1 0 0 0  PHQ - 2 Score 2 2 0 0 0  Altered sleeping 2 1 - 0 0  Tired, decreased energy 1 3 - 0 0  Change in appetite 2 2 - 3 3  Feeling bad or failure about yourself  2 1 - 0 0  Trouble concentrating 0 1 - 0 0  Moving slowly or fidgety/restless 0 1 - 0 0  Suicidal thoughts 0 0 - 0 0  PHQ-9 Score 9 11 - 3 3  Difficult doing work/chores Somewhat difficult Very difficult - Not difficult at all Not difficult at all  Some recent data might be hidden    Relevant past medical, surgical, family and social history reviewed and updated as indicated. Interim medical history since our last visit reviewed. Allergies and medications reviewed and updated.  Review of Systems  Constitutional: Negative.   Respiratory: Negative.   Cardiovascular: Negative.   Gastrointestinal: Negative.   Musculoskeletal:  Negative.   Neurological: Negative.   Psychiatric/Behavioral: Negative.     Per HPI unless specifically indicated above     Objective:    BP 136/84   Pulse (!) 59   Temp 97.7 F (36.5 C) (Oral)   Wt 217 lb (98.4 kg)   SpO2 98%   BMI 32.99 kg/m   Wt Readings from Last 3 Encounters:  06/18/20 217 lb (98.4 kg)  05/21/20 (!) 216 lb 9.6 oz (98.2 kg)  04/08/20 213 lb 12.8 oz (97 kg)    Physical Exam Vitals and nursing note reviewed.  Constitutional:      General: She is not in acute distress.    Appearance: Normal appearance. She is not ill-appearing, toxic-appearing or diaphoretic.  HENT:     Head: Normocephalic and atraumatic.     Right Ear: External ear normal.     Left Ear: External ear normal.     Nose: Nose normal.     Mouth/Throat:     Mouth: Mucous membranes are moist.     Pharynx: Oropharynx is clear.  Eyes:     General: No scleral icterus.       Right eye: No discharge.        Left eye: No discharge.     Extraocular Movements: Extraocular movements intact.     Conjunctiva/sclera: Conjunctivae normal.     Pupils: Pupils are equal, round, and reactive to light.  Cardiovascular:  Rate and Rhythm: Normal rate and regular rhythm.     Pulses: Normal pulses.     Heart sounds: Normal heart sounds. No murmur heard.  No friction rub. No gallop.   Pulmonary:     Effort: Pulmonary effort is normal. No respiratory distress.     Breath sounds: Normal breath sounds. No stridor. No wheezing, rhonchi or rales.  Chest:     Chest wall: No tenderness.  Musculoskeletal:        General: Normal range of motion.     Cervical back: Normal range of motion and neck supple.  Skin:    General: Skin is warm and dry.     Capillary Refill: Capillary refill takes less than 2 seconds.     Coloration: Skin is not jaundiced or pale.     Findings: No bruising, erythema, lesion or rash.  Neurological:     General: No focal deficit present.     Mental Status: She is alert and oriented  to person, place, and time. Mental status is at baseline.  Psychiatric:        Mood and Affect: Mood normal.        Behavior: Behavior normal.        Thought Content: Thought content normal.        Judgment: Judgment normal.     Results for orders placed or performed in visit on 04/08/20  Microalbumin, Urine Waived  Result Value Ref Range   Microalb, Ur Waived 80 (H) 0 - 19 mg/L   Creatinine, Urine Waived 300 10 - 300 mg/dL   Microalb/Creat Ratio <30 <30 mg/g  CBC with Differential/Platelet  Result Value Ref Range   WBC 5.1 3.4 - 10.8 x10E3/uL   RBC 5.09 3.77 - 5.28 x10E6/uL   Hemoglobin 15.1 11.1 - 15.9 g/dL   Hematocrit 44.5 34.0 - 46.6 %   MCV 87 79 - 97 fL   MCH 29.7 26.6 - 33.0 pg   MCHC 33.9 31 - 35 g/dL   RDW 12.9 11.7 - 15.4 %   Platelets 223 150 - 450 x10E3/uL   Neutrophils 72 Not Estab. %   Lymphs 15 Not Estab. %   Monocytes 9 Not Estab. %   Eos 3 Not Estab. %   Basos 1 Not Estab. %   Neutrophils Absolute 3.6 1 - 7 x10E3/uL   Lymphocytes Absolute 0.8 0 - 3 x10E3/uL   Monocytes Absolute 0.5 0 - 0 x10E3/uL   EOS (ABSOLUTE) 0.2 0.0 - 0.4 x10E3/uL   Basophils Absolute 0.0 0 - 0 x10E3/uL   Immature Granulocytes 0 Not Estab. %   Immature Grans (Abs) 0.0 0.0 - 0.1 x10E3/uL  Comprehensive metabolic panel  Result Value Ref Range   Glucose 83 65 - 99 mg/dL   BUN 25 8 - 27 mg/dL   Creatinine, Ser 0.80 0.57 - 1.00 mg/dL   GFR calc non Af Amer 76 >59 mL/min/1.73   GFR calc Af Amer 88 >59 mL/min/1.73   BUN/Creatinine Ratio 31 (H) 12 - 28   Sodium 139 134 - 144 mmol/L   Potassium 4.2 3.5 - 5.2 mmol/L   Chloride 106 96 - 106 mmol/L   CO2 21 20 - 29 mmol/L   Calcium 9.0 8.7 - 10.3 mg/dL   Total Protein 6.2 6.0 - 8.5 g/dL   Albumin 4.2 3.8 - 4.8 g/dL   Globulin, Total 2.0 1.5 - 4.5 g/dL   Albumin/Globulin Ratio 2.1 1.2 - 2.2   Bilirubin Total 0.5 0.0 - 1.2  mg/dL   Alkaline Phosphatase 103 48 - 121 IU/L   AST 19 0 - 40 IU/L   ALT 12 0 - 32 IU/L  Lipid Panel w/o  Chol/HDL Ratio  Result Value Ref Range   Cholesterol, Total 172 100 - 199 mg/dL   Triglycerides 63 0 - 149 mg/dL   HDL 76 >39 mg/dL   VLDL Cholesterol Cal 12 5 - 40 mg/dL   LDL Chol Calc (NIH) 84 0 - 99 mg/dL  TSH  Result Value Ref Range   TSH 4.060 0.450 - 4.500 uIU/mL  Urinalysis, Routine w reflex microscopic  Result Value Ref Range   Specific Gravity, UA >1.030 (H) 1.005 - 1.030   pH, UA 5.0 5.0 - 7.5   Color, UA Yellow Yellow   Appearance Ur Clear Clear   Leukocytes,UA Negative Negative   Protein,UA Negative Negative/Trace   Glucose, UA Negative Negative   Ketones, UA Negative Negative   RBC, UA Negative Negative   Bilirubin, UA Negative Negative   Urobilinogen, Ur 0.2 0.2 - 1.0 mg/dL   Nitrite, UA Negative Negative  Iron and TIBC  Result Value Ref Range   Total Iron Binding Capacity 269 250 - 450 ug/dL   UIBC 180 118 - 369 ug/dL   Iron 89 27 - 139 ug/dL   Iron Saturation 33 15 - 55 %  Ferritin  Result Value Ref Range   Ferritin 74 15.0 - 150.0 ng/mL      Assessment & Plan:   Problem List Items Addressed This Visit      Other   Binge eating disorder    Under good control on current regimen. Continue current regimen. Continue to monitor. Call with any concerns. Refills given for 3 months. Follow up 3 months.        Depression, recurrent ([place])    Improving. Will increase her fluoxetine to 60mg  and recheck 3 months. Call with any concerns.       Relevant Medications   FLUoxetine (PROZAC) 20 MG capsule       Follow up plan: No follow-ups on file.

## 2020-06-18 NOTE — Assessment & Plan Note (Signed)
Improving. Will increase her fluoxetine to 60mg  and recheck 3 months. Call with any concerns.

## 2020-06-28 DIAGNOSIS — L920 Granuloma annulare: Secondary | ICD-10-CM | POA: Diagnosis not present

## 2020-06-28 DIAGNOSIS — Z79899 Other long term (current) drug therapy: Secondary | ICD-10-CM | POA: Diagnosis not present

## 2020-07-29 ENCOUNTER — Encounter: Payer: Self-pay | Admitting: Unknown Physician Specialty

## 2020-07-29 ENCOUNTER — Telehealth (INDEPENDENT_AMBULATORY_CARE_PROVIDER_SITE_OTHER): Payer: PPO | Admitting: Unknown Physician Specialty

## 2020-07-29 DIAGNOSIS — J069 Acute upper respiratory infection, unspecified: Secondary | ICD-10-CM

## 2020-07-29 DIAGNOSIS — Z03818 Encounter for observation for suspected exposure to other biological agents ruled out: Secondary | ICD-10-CM | POA: Diagnosis not present

## 2020-07-29 DIAGNOSIS — Z1152 Encounter for screening for COVID-19: Secondary | ICD-10-CM | POA: Diagnosis not present

## 2020-07-29 DIAGNOSIS — Z20822 Contact with and (suspected) exposure to covid-19: Secondary | ICD-10-CM | POA: Diagnosis not present

## 2020-07-29 NOTE — Progress Notes (Signed)
There were no vitals taken for this visit.   Subjective:    Patient ID: Mckenzie Webster, female    DOB: [birth year], 69 y.o.   MRN: [id number]  HPI: Mckenzie Webster is a 69 y.o. female  Chief Complaint  Patient presents with  . URI    sore throat, chest congestion, cough, and ear pressure x 1 week.    Due to the catastrophic nature of the COVID-19 pandemic, this visit was completed via audio and visual contact via Caregility due to the restrictions of the COVID-19 pandemic. All issues as above were discussed and addressed. Physical exam was done as above through visual confirmation on Caregility. If it was felt that the patient should be evaluated in the office, they were directed there. The patient verbally consented to this visit."} . Location of the patient: home . Location of the provider: work . Those involved with this call:  . Provider: Kathrine Haddock, DNP . CMA: Yvonna Alanis, CMA . Front Desk/Registration: Jill Side  . Time spent on call: 10 minutes with patient face to face via video conference. More than 50% of this time was spent in counseling and coordination of care. 2 minutes total spent in review of patient's record and preparation of their chart.  I verified patient identity using two factors (patient name and date of birth). Patient consents verbally to being seen via telemedicine visit today.   URI  This is a new problem. The current episode started in the past 7 days. The problem has been unchanged. There has been no fever. Associated symptoms include congestion, coughing, a plugged ear sensation, rhinorrhea and a sore throat. Pertinent negatives include no abdominal pain, chest pain, diarrhea, dysuria, headaches, joint pain, joint swelling, nausea, neck pain, rash, sinus pain, swollen glands, vomiting or wheezing. She has tried nothing for the symptoms.    Relevant past medical, surgical, family and social history reviewed and updated as indicated. Interim  medical history since our last visit reviewed. Allergies and medications reviewed and updated.  Review of Systems  HENT: Positive for congestion, rhinorrhea and sore throat. Negative for sinus pain.   Respiratory: Positive for cough. Negative for wheezing.   Cardiovascular: Negative for chest pain.  Gastrointestinal: Negative for abdominal pain, diarrhea, nausea and vomiting.  Genitourinary: Negative for dysuria.  Musculoskeletal: Negative for joint pain and neck pain.  Skin: Negative for rash.  Neurological: Negative for headaches.    Per HPI unless specifically indicated above     Objective:    There were no vitals taken for this visit.  Wt Readings from Last 3 Encounters:  06/18/20 217 lb (98.4 kg)  05/21/20 (!) 216 lb 9.6 oz (98.2 kg)  04/08/20 213 lb 12.8 oz (97 kg)    Physical Exam Constitutional:      General: She is not in acute distress.    Appearance: Normal appearance. She is well-developed.  HENT:     Head: Normocephalic and atraumatic.  Eyes:     General: Lids are normal. No scleral icterus.       Right eye: No discharge.        Left eye: No discharge.     Conjunctiva/sclera: Conjunctivae normal.  Cardiovascular:     Rate and Rhythm: Normal rate.  Pulmonary:     Effort: Pulmonary effort is normal.  Abdominal:     Palpations: There is no hepatomegaly or splenomegaly.  Musculoskeletal:        General: Normal range of motion.  Skin:  Coloration: Skin is not pale.     Findings: No rash.  Neurological:     Mental Status: She is alert and oriented to person, place, and time.  Psychiatric:        Behavior: Behavior normal.        Thought Content: Thought content normal.        Judgment: Judgment normal.      Assessment & Plan:   Problem List Items Addressed This Visit    None    Visit Diagnoses    Viral upper respiratory tract infection    -  Primary   Discussed Covid testing.  Saline nasal sprays.  Supportive care.  Will monitor and discussed  antibiotics only if worsening or no improvement after 2 weeks.         Follow up plan: Return if symptoms worsen or fail to improve.

## 2020-08-14 ENCOUNTER — Other Ambulatory Visit: Payer: Self-pay | Admitting: Family Medicine

## 2020-08-14 NOTE — Telephone Encounter (Signed)
Requested Prescriptions  Pending Prescriptions Disp Refills  . FLUoxetine (PROZAC) 20 MG capsule [Pharmacy Med Name: FLUOXETINE HCL 20 MG CAP] 90 capsule 1    Sig: TAKE 3 CAPSULES BY MOUTH DAILY     Psychiatry:  Antidepressants - SSRI Passed - 08/14/2020  9:19 AM      Passed - Completed PHQ-2 or PHQ-9 in the last 360 days.      Passed - Valid encounter within last 6 months    Recent Outpatient Visits          2 weeks ago Viral upper respiratory tract infection   [facility] Kathrine Haddock, NP   1 month ago Depression, recurrent [facility])   [place], Megan P, DO   2 months ago Depression, recurrent [facility])   [place], Megan P, DO   4 months ago Iron deficiency anemia, unspecified iron deficiency anemia type   [place], DO   4 months ago Right arm pain   [facility] Volney American, [place]      Future Appointments            In 1 month Wynetta Emery, Barb Merino, DO [employer], [facility]

## 2020-08-31 ENCOUNTER — Encounter: Payer: Self-pay | Admitting: Nurse Practitioner

## 2020-08-31 ENCOUNTER — Other Ambulatory Visit: Payer: Self-pay

## 2020-08-31 ENCOUNTER — Telehealth (INDEPENDENT_AMBULATORY_CARE_PROVIDER_SITE_OTHER): Payer: PPO | Admitting: Nurse Practitioner

## 2020-08-31 DIAGNOSIS — R059 Cough, unspecified: Secondary | ICD-10-CM

## 2020-08-31 MED ORDER — ALBUTEROL SULFATE HFA 108 (90 BASE) MCG/ACT IN AERS: 2.0000 | INHALATION_SPRAY | Freq: Four times a day (QID) | RESPIRATORY_TRACT | 1 refills | Status: DC | PRN

## 2020-08-31 MED ORDER — PREDNISONE 10 MG PO TABS: ORAL_TABLET | ORAL | 0 refills | Status: DC

## 2020-08-31 MED ORDER — DOXYCYCLINE HYCLATE 100 MG PO TABS: 100.0000 mg | ORAL_TABLET | Freq: Two times a day (BID) | ORAL | 0 refills | Status: DC

## 2020-08-31 NOTE — Assessment & Plan Note (Addendum)
Acute, ongoing x 6-7 weeks.  Likely bacterial sinusitis vs. Pneumonia given length of symptoms.  May also be secondary virus, however seems less likely.  Will obtain chest x-ray to confirm if pneumonia, still plan to treat with doxycycline as has done well with this in the past.  Refill also given for albuterol to use as needed for wheezing.  If no better in 2 days, instructed to start prednisone taper.  If not better by early next week, return to clinic.

## 2020-08-31 NOTE — Progress Notes (Signed)
There were no vitals taken for this visit.   Subjective:    Patient ID: Mckenzie Webster, female    DOB: 1951/06/29, 69 y.o.   MRN: [id number]  HPI: Mckenzie Webster is a 69 y.o. female presenting with cough.   Chief Complaint  Patient presents with  . Cough    pt was seen 07/29/20. States she is still having a lingering cough, mainly bad at nighttime. Patient is worried about it going into Bronchitis.    UPPER RESPIRATORY TRACT INFECTION Worst symptom: cough x 6-7 weeks Fever: no Cough: yes; productive; yellow and worse when laying down or first thing in the morning Shortness of breath: no Wheezing: yes Chest pain: no Chest tightness: no Chest congestion: yes Nasal congestion: no Runny nose: no Post nasal drip: yes Sneezing: no Sore throat: yes Swollen glands: yes; on right side Sinus pressure: yes Headache: no Face pain: no Toothache: no Ear pain: no   Decreased hearing: yes Ear pressure: no  Eyes red/itching:no Eye drainage/crusting: no  Appetite change: no; maybe a little less Nausea: no Vomiting: no Rash: no Fatigue: yes Sick contacts: yes; daughter and she got cold around same time; people at work have had cough Strep contacts: no  Context: worse now Recurrent sinusitis: no Relief with OTC cold/cough medications: no   Treatments attempted: flonase,  Zyrtec  No Known Allergies  Outpatient Encounter Medications as of 08/31/2020  Medication Sig  . cetirizine (ZYRTEC) 10 MG tablet Take 10 mg by mouth daily.  . famotidine (PEPCID) 20 MG tablet TAKE 1 TABLET BY MOUTH TWICE A DAY  . ferrous sulfate 325 (65 FE) MG EC tablet Take 1 tablet (325 mg total) by mouth daily.  [name] Kitchen FLUoxetine (PROZAC) 20 MG capsule TAKE 3 CAPSULES BY MOUTH DAILY  . fluticasone (FLONASE) 50 MCG/ACT nasal spray Place 2 sprays into both nostrils daily.   [name] Kitchen GLUCOSAMINE-CHONDROITIN PO Take 2 tablets by mouth daily.   . hydroxychloroquine (PLAQUENIL) 200 MG tablet Take 1 tablet by mouth 2 (two)  times daily.  [name] Kitchen lisdexamfetamine (VYVANSE) 60 MG capsule Take 1 capsule (60 mg total) by mouth every morning.  . naproxen (NAPROXEN DR) 500 MG EC tablet Take 1 tablet (500 mg total) by mouth 2 (two) times daily with a meal.  . albuterol (VENTOLIN HFA) 108 (90 Base) MCG/ACT inhaler Inhale 2 puffs into the lungs every 6 (six) hours as needed for wheezing or shortness of breath.  . doxycycline (VIBRA-TABS) 100 MG tablet Take 1 tablet (100 mg total) by mouth 2 (two) times daily.  [name] Kitchen lisdexamfetamine (VYVANSE) 60 MG capsule Take 1 capsule (60 mg total) by mouth every morning.  . lisdexamfetamine (VYVANSE) 60 MG capsule Take 1 capsule (60 mg total) by mouth every morning.  . predniSONE (DELTASONE) 10 MG tablet 6 tabs first morning, 5 tabs second morning, decrease by 1 every morning until gone   No facility-administered encounter medications on file as of 08/31/2020.   Patient Active Problem List   Diagnosis Date Noted  . Cough 08/31/2020  . Depression, recurrent ([place]) 03/24/2020  . Drug rash 06/04/2019  . Osteoporosis 11/28/2017  . Binge eating disorder 01/04/2016  . Iron deficiency anemia   . GERD without esophagitis 01/29/2015   Past Medical History:  Diagnosis Date  . Anemia   . Cataract   . Dry eye syndrome   . Positive colorectal cancer screening using Cologuard test 02/14/2017  . Stomach ulcer    Relevant past medical, surgical, family and social history  reviewed and updated as indicated. Interim medical history since our last visit reviewed.  Review of Systems  Constitutional: Positive for activity change and fatigue. Negative for appetite change and fever.  HENT: Positive for congestion, postnasal drip, sinus pressure and sore throat. Negative for dental problem, ear discharge, ear pain, rhinorrhea, sinus pain and sneezing.   Eyes: Negative.  Negative for pain, discharge, redness and itching.  Respiratory: Positive for cough and wheezing. Negative for chest tightness and shortness  of breath.   Cardiovascular: Negative.  Negative for chest pain and palpitations.  Gastrointestinal: Negative.  Negative for nausea and vomiting.  Skin: Negative.  Negative for rash.  Neurological: Negative.  Negative for weakness and headaches.  Psychiatric/Behavioral: Negative.     Per HPI unless specifically indicated above     Objective:    There were no vitals taken for this visit.  Wt Readings from Last 3 Encounters:  06/18/20 217 lb (98.4 kg)  05/21/20 (!) 216 lb 9.6 oz (98.2 kg)  04/08/20 213 lb 12.8 oz (97 kg)    Physical Exam Vitals and nursing note reviewed.  Constitutional:      General: She is not in acute distress.    Appearance: Normal appearance. She is not toxic-appearing.  HENT:     Head: Normocephalic and atraumatic.     Right Ear: External ear normal.     Left Ear: External ear normal.     Nose: Congestion present. No rhinorrhea.     Mouth/Throat:     Mouth: Mucous membranes are moist.     Pharynx: Oropharynx is clear.  Eyes:     General: No scleral icterus.       Right eye: No discharge.        Left eye: No discharge.     Extraocular Movements: Extraocular movements intact.  Cardiovascular:     Comments: Unable to assess heart sounds via virtual visit Pulmonary:     Effort: Pulmonary effort is normal. No respiratory distress.     Comments: Unable to assess lung sounds via virtual visit.  Patient talking in complete sentences with no accessory muscle use Abdominal:     Comments: Unable to assess bowel sounds via virtual visit  Musculoskeletal:     Cervical back: Normal range of motion.  Skin:    Coloration: Skin is not jaundiced or pale.     Findings: No erythema.  Neurological:     Mental Status: She is alert and oriented to person, place, and time.  Psychiatric:        Mood and Affect: Mood normal.        Behavior: Behavior normal.        Thought Content: Thought content normal.        Judgment: Judgment normal.       Assessment & Plan:    Problem List Items Addressed This Visit      Other   Cough - Primary    Acute, ongoing x 6-7 weeks.  Likely bacterial sinusitis vs. Pneumonia given length of symptoms.  May also be secondary virus, however seems less likely.  Will obtain chest x-ray to confirm if pneumonia, still plan to treat with doxycycline as has done well with this in the past.  Refill also given for albuterol to use as needed for wheezing.  If no better in 2 days, instructed to start prednisone taper.  If not better by early next week, return to clinic.        Relevant Orders  DG Chest 2 View       Follow up plan: Return if symptoms worsen or fail to improve.   Due to the catastrophic nature of the COVID-19 pandemic, this visit was completed via audio and visual contact via Caregility due to the restrictions of the COVID-19 pandemic. All issues as above were discussed and addressed. Physical exam was done as above through visual confirmation on Caregility. If it was felt that the patient should be evaluated in the office, they were directed there. The patient verbally consented to this visit."} . Location of the patient: home . Location of the provider: work . Those involved with this call:  . Provider: Carnella Gu[place], DNP . CMA: Yvonna Alanis, CMA . Front Desk/Registration: Jill Side  . Time spent on call: 17 minutes with patient face to face via video conference. More than 50% of this time was spent in counseling and coordination of care. 20 minutes total spent in review of patient's record and preparation of their chart.  I verified patient identity using two factors (patient name and date of birth). Patient consents verbally to being seen via telemedicine visit today.

## 2020-08-31 NOTE — Patient Instructions (Signed)

## 2020-09-01 ENCOUNTER — Ambulatory Visit
Admission: RE | Admit: 2020-09-01 | Discharge: 2020-09-01 | Disposition: A | Discharge status: Home / Self Care | Payer: PPO | Source: Ambulatory Visit | Attending: Nurse Practitioner | Admitting: Nurse Practitioner

## 2020-09-01 ENCOUNTER — Other Ambulatory Visit: Payer: Self-pay

## 2020-09-01 ENCOUNTER — Ambulatory Visit
Admission: RE | Admit: 2020-09-01 | Discharge: 2020-09-01 | Disposition: A | Discharge status: Home / Self Care | Payer: PPO | Attending: Nurse Practitioner | Admitting: Nurse Practitioner

## 2020-09-01 DIAGNOSIS — R059 Cough, unspecified: Secondary | ICD-10-CM | POA: Insufficient documentation

## 2020-09-01 DIAGNOSIS — I517 Cardiomegaly: Secondary | ICD-10-CM | POA: Diagnosis not present

## 2020-09-02 NOTE — Addendum Note (Signed)
Addended by: Noemi Chapel A on: 09/02/2020 04:34 PM   Modules accepted: Orders

## 2020-09-06 ENCOUNTER — Ambulatory Visit
Admission: RE | Admit: 2020-09-06 | Discharge: 2020-09-06 | Disposition: A | Discharge status: Home / Self Care | Payer: PPO | Attending: Nurse Practitioner | Admitting: Nurse Practitioner

## 2020-09-06 ENCOUNTER — Ambulatory Visit
Admission: RE | Admit: 2020-09-06 | Discharge: 2020-09-06 | Disposition: A | Discharge status: Home / Self Care | Payer: PPO | Source: Ambulatory Visit | Attending: Nurse Practitioner | Admitting: Nurse Practitioner

## 2020-09-06 ENCOUNTER — Other Ambulatory Visit: Payer: Self-pay

## 2020-09-06 DIAGNOSIS — R911 Solitary pulmonary nodule: Secondary | ICD-10-CM | POA: Diagnosis not present

## 2020-09-06 DIAGNOSIS — R059 Cough, unspecified: Secondary | ICD-10-CM

## 2020-09-07 ENCOUNTER — Telehealth: Payer: Self-pay | Admitting: Nurse Practitioner

## 2020-09-07 DIAGNOSIS — R059 Cough, unspecified: Secondary | ICD-10-CM

## 2020-09-07 MED ORDER — BENZONATATE 100 MG PO CAPS: 100.0000 mg | ORAL_CAPSULE | Freq: Two times a day (BID) | ORAL | 1 refills | Status: DC | PRN

## 2020-09-07 NOTE — Telephone Encounter (Signed)
Per Janett Billow pt called back

## 2020-09-07 NOTE — Telephone Encounter (Signed)
Called and left message to return call to clinic to discuss chest x-ray results.

## 2020-09-07 NOTE — Telephone Encounter (Signed)
Tried to return to call, went straight to VM.  VM left to return call.

## 2020-09-07 NOTE — Telephone Encounter (Signed)
Pt returned call, please advise if [facility] may disclose.  °

## 2020-09-07 NOTE — Telephone Encounter (Signed)
Discussed chest x-ray results with patient.  Agreeable to obtain chest CT per recommendation.  With ongoing bothersome cough, will start tessalon pearls and monitor for response.

## 2020-09-14 ENCOUNTER — Ambulatory Visit (INDEPENDENT_AMBULATORY_CARE_PROVIDER_SITE_OTHER): Payer: PPO | Admitting: Family Medicine

## 2020-09-14 ENCOUNTER — Other Ambulatory Visit: Payer: Self-pay

## 2020-09-14 ENCOUNTER — Encounter: Payer: Self-pay | Admitting: Family Medicine

## 2020-09-14 VITALS — BP 137/85 | HR 68 | Temp 98.5°F | Ht 68.5 in | Wt 213.0 lb

## 2020-09-14 DIAGNOSIS — R053 Chronic cough: Secondary | ICD-10-CM

## 2020-09-14 DIAGNOSIS — H6123 Impacted cerumen, bilateral: Secondary | ICD-10-CM | POA: Diagnosis not present

## 2020-09-14 DIAGNOSIS — F339 Major depressive disorder, recurrent, unspecified: Secondary | ICD-10-CM | POA: Diagnosis not present

## 2020-09-14 DIAGNOSIS — Z23 Encounter for immunization: Secondary | ICD-10-CM | POA: Diagnosis not present

## 2020-09-14 DIAGNOSIS — F5081 Binge eating disorder: Secondary | ICD-10-CM | POA: Diagnosis not present

## 2020-09-14 MED ORDER — LISDEXAMFETAMINE DIMESYLATE 60 MG PO CAPS: 60.0000 mg | ORAL_CAPSULE | ORAL | 0 refills | Status: DC

## 2020-09-14 MED ORDER — FAMOTIDINE 20 MG PO TABS
20.0000 mg | ORAL_TABLET | Freq: Two times a day (BID) | ORAL | 1 refills | Status: DC
Start: 2020-09-14 — End: 2021-03-03

## 2020-09-14 MED ORDER — FLUOXETINE HCL 20 MG PO CAPS
60.0000 mg | ORAL_CAPSULE | Freq: Every day | ORAL | 1 refills | Status: DC
Start: 2020-09-14 — End: 2021-03-03

## 2020-09-14 NOTE — Progress Notes (Signed)
BP 137/85   Pulse 68   Temp 98.5 F (36.9 C) (Oral)   Ht 5' 8.5" (1.74 m)   Wt 213 lb (96.6 kg)   SpO2 98%   BMI 31.92 kg/m    Subjective:    Patient ID: Mckenzie Webster, female    DOB: October 24, 1951, 69 y.o.   MRN: [id number]  HPI: Mckenzie Webster is a 69 y.o. female  Chief Complaint  Patient presents with  . Depression  . Binge Eating    Vyvanse   Has been having a chronic cough- to have a CT next week due to a persistent nodular density on x-ray. She has been quite wheezy. Has some congestion and irritation in her ears as well. No fevers. No chills. She is feeling OK but her cough is very annoying.   Feels like the vyvanse continues to help with her binge eating. She has been able to control it. Weight has been stable. No side effects. No other concerns.   DEPRESSION Mood status: better Satisfied with current treatment?: yes Symptom severity: mild  Duration of current treatment : months Side effects: no Medication compliance: excellent compliance Psychotherapy/counseling: no  Previous psychiatric medications: sertraline Depressed mood: no Anxious mood: no Anhedonia: no Significant weight loss or gain: no Insomnia: no  Fatigue: no Feelings of worthlessness or guilt: no Impaired concentration/indecisiveness: no Suicidal ideations: no Hopelessness: no Crying spells: no Depression screen [facility] 2/9 09/14/2020 05/21/2020 04/08/2020 03/22/2020 12/29/2019  Decreased Interest 0 1 1 0 0  Down, Depressed, Hopeless 0 1 1 0 0  PHQ - 2 Score 0 2 2 0 0  Altered sleeping 0 2 1 - 0  Tired, decreased energy 1 1 3  - 0  Change in appetite 0 2 2 - 3  Feeling bad or failure about yourself  0 2 1 - 0  Trouble concentrating 0 0 1 - 0  Moving slowly or fidgety/restless 0 0 1 - 0  Suicidal thoughts 0 0 0 - 0  PHQ-9 Score 1 9 11  - 3  Difficult doing work/chores - Somewhat difficult Very difficult - Not difficult at all  Some recent data might be hidden    Relevant past medical, surgical,  family and social history reviewed and updated as indicated. Interim medical history since our last visit reviewed. Allergies and medications reviewed and updated.  Review of Systems  Constitutional: Negative.   HENT: Positive for congestion and ear pain. Negative for dental problem, drooling, ear discharge, facial swelling, hearing loss, mouth sores, nosebleeds, postnasal drip, rhinorrhea, sinus pressure, sinus pain, sneezing, sore throat, tinnitus, trouble swallowing and voice change.   Respiratory: Positive for cough, chest tightness and wheezing. Negative for apnea, choking, shortness of breath and stridor.   Cardiovascular: Negative.   Musculoskeletal: Negative.   Neurological: Negative.   Psychiatric/Behavioral: Negative.     Per HPI unless specifically indicated above     Objective:    BP 137/85   Pulse 68   Temp 98.5 F (36.9 C) (Oral)   Ht 5' 8.5" (1.74 m)   Wt 213 lb (96.6 kg)   SpO2 98%   BMI 31.92 kg/m   Wt Readings from Last 3 Encounters:  09/14/20 213 lb (96.6 kg)  06/18/20 217 lb (98.4 kg)  05/21/20 (!) 216 lb 9.6 oz (98.2 kg)    Physical Exam Vitals and nursing note reviewed.  Constitutional:      General: She is not in acute distress.    Appearance: Normal appearance. She is  not ill-appearing, toxic-appearing or diaphoretic.  HENT:     Head: Normocephalic and atraumatic.     Right Ear: External ear normal.     Left Ear: External ear normal.     Nose: Nose normal.     Mouth/Throat:     Mouth: Mucous membranes are moist.     Pharynx: Oropharynx is clear.  Eyes:     General: No scleral icterus.       Right eye: No discharge.        Left eye: No discharge.     Extraocular Movements: Extraocular movements intact.     Conjunctiva/sclera: Conjunctivae normal.     Pupils: Pupils are equal, round, and reactive to light.  Cardiovascular:     Rate and Rhythm: Normal rate and regular rhythm.     Pulses: Normal pulses.     Heart sounds: Normal heart sounds.  No murmur heard.  No friction rub. No gallop.   Pulmonary:     Effort: Pulmonary effort is normal. No respiratory distress.     Breath sounds: Normal breath sounds. No stridor. No wheezing, rhonchi or rales.  Chest:     Chest wall: No tenderness.  Musculoskeletal:        General: Normal range of motion.     Cervical back: Normal range of motion and neck supple.  Skin:    General: Skin is warm and dry.     Capillary Refill: Capillary refill takes less than 2 seconds.     Coloration: Skin is not jaundiced or pale.     Findings: No bruising, erythema, lesion or rash.  Neurological:     General: No focal deficit present.     Mental Status: She is alert and oriented to person, place, and time. Mental status is at baseline.  Psychiatric:        Mood and Affect: Mood normal.        Behavior: Behavior normal.        Thought Content: Thought content normal.        Judgment: Judgment normal.     Results for orders placed or performed in visit on 04/08/20  Microalbumin, Urine Waived  Result Value Ref Range   Microalb, Ur Waived 80 (H) 0 - 19 mg/L   Creatinine, Urine Waived 300 10 - 300 mg/dL   Microalb/Creat Ratio <30 <30 mg/g  CBC with Differential/Platelet  Result Value Ref Range   WBC 5.1 3.4 - 10.8 x10E3/uL   RBC 5.09 3.77 - 5.28 x10E6/uL   Hemoglobin 15.1 11.1 - 15.9 g/dL   Hematocrit 44.5 34.0 - 46.6 %   MCV 87 79 - 97 fL   MCH 29.7 26.6 - 33.0 pg   MCHC 33.9 31 - 35 g/dL   RDW 12.9 11.7 - 15.4 %   Platelets 223 150 - 450 x10E3/uL   Neutrophils 72 Not Estab. %   Lymphs 15 Not Estab. %   Monocytes 9 Not Estab. %   Eos 3 Not Estab. %   Basos 1 Not Estab. %   Neutrophils Absolute 3.6 1.40 - 7.00 x10E3/uL   Lymphocytes Absolute 0.8 0 - 3 x10E3/uL   Monocytes Absolute 0.5 0 - 0 x10E3/uL   EOS (ABSOLUTE) 0.2 0.0 - 0.4 x10E3/uL   Basophils Absolute 0.0 0 - 0 x10E3/uL   Immature Granulocytes 0 Not Estab. %   Immature Grans (Abs) 0.0 0.0 - 0.1 x10E3/uL  Comprehensive  metabolic panel  Result Value Ref Range   Glucose 83 65 -  99 mg/dL   BUN 25 8 - 27 mg/dL   Creatinine, Ser 0.80 0.57 - 1.00 mg/dL   GFR calc non Af Amer 76 >59 mL/min/1.73   GFR calc Af Amer 88 >59 mL/min/1.73   BUN/Creatinine Ratio 31 (H) 12 - 28   Sodium 139 134 - 144 mmol/L   Potassium 4.2 3.5 - 5.2 mmol/L   Chloride 106 96 - 106 mmol/L   CO2 21 20 - 29 mmol/L   Calcium 9.0 8.7 - 10.3 mg/dL   Total Protein 6.2 6.0 - 8.5 g/dL   Albumin 4.2 3.8 - 4.8 g/dL   Globulin, Total 2.0 1.5 - 4.5 g/dL   Albumin/Globulin Ratio 2.1 1.2 - 2.2   Bilirubin Total 0.5 0.0 - 1.2 mg/dL   Alkaline Phosphatase 103 48 - 121 IU/L   AST 19 0 - 40 IU/L   ALT 12 0 - 32 IU/L  Lipid Panel w/o Chol/HDL Ratio  Result Value Ref Range   Cholesterol, Total 172 100 - 199 mg/dL   Triglycerides 63 0 - 149 mg/dL   HDL 76 >39 mg/dL   VLDL Cholesterol Cal 12 5 - 40 mg/dL   LDL Chol Calc (NIH) 84 0 - 99 mg/dL  TSH  Result Value Ref Range   TSH 4.060 0.450 - 4.500 uIU/mL  Urinalysis, Routine w reflex microscopic  Result Value Ref Range   Specific Gravity, UA >1.030 (H) 1.005 - 1.030   pH, UA 5.0 5.0 - 7.5   Color, UA Yellow Yellow   Appearance Ur Clear Clear   Leukocytes,UA Negative Negative   Protein,UA Negative Negative/Trace   Glucose, UA Negative Negative   Ketones, UA Negative Negative   RBC, UA Negative Negative   Bilirubin, UA Negative Negative   Urobilinogen, Ur 0.2 0.2 - 1.0 mg/dL   Nitrite, UA Negative Negative  Iron and TIBC  Result Value Ref Range   Total Iron Binding Capacity 269 250 - 450 ug/dL   UIBC 180 118 - 369 ug/dL   Iron 89 27 - 139 ug/dL   Iron Saturation 33 15 - 55 %  Ferritin  Result Value Ref Range   Ferritin 74 15.0 - 150.0 ng/mL      Assessment & Plan:   Problem List Items Addressed This Visit      Other   Binge eating disorder    Under good control on current regimen. Continue current regimen. Continue to monitor. Call with any concerns. Refills given for 3 months.  Follow up 3 months.       Depression, recurrent ([place]) - Primary    Under good control on current regimen. Continue current regimen. Continue to monitor. Call with any concerns. Refills given.       Relevant Medications   FLUoxetine (PROZAC) 20 MG capsule    Other Visit Diagnoses    Bilateral impacted cerumen       Advised debrox. Call with any concerns. Continue to monitor.    Chronic cough       Will start spiriva. Await CT results. If no clear reason for cough, will do spirometry next visit. Continue albuterol. Call with any concerns.    Need for influenza vaccination       Flu shot given today.    Relevant Orders   Flu Vaccine QUAD High Dose(Fluad) (Completed)       Follow up plan: Return in about 2 weeks (around 09/28/2020).

## 2020-09-14 NOTE — Assessment & Plan Note (Signed)
Under good control on current regimen. Continue current regimen. Continue to monitor. Call with any concerns. Refills given.   

## 2020-09-14 NOTE — Assessment & Plan Note (Signed)
Under good control on current regimen. Continue current regimen. Continue to monitor. Call with any concerns. Refills given for 3 months. Follow up 3 months.    

## 2020-09-20 ENCOUNTER — Ambulatory Visit
Admission: RE | Admit: 2020-09-20 | Discharge: 2020-09-20 | Disposition: A | Discharge status: Home / Self Care | Payer: PPO | Source: Ambulatory Visit | Attending: Nurse Practitioner | Admitting: Nurse Practitioner

## 2020-09-20 ENCOUNTER — Other Ambulatory Visit: Payer: Self-pay

## 2020-09-20 DIAGNOSIS — R059 Cough, unspecified: Secondary | ICD-10-CM | POA: Diagnosis not present

## 2020-09-20 DIAGNOSIS — R918 Other nonspecific abnormal finding of lung field: Secondary | ICD-10-CM | POA: Diagnosis not present

## 2020-09-20 DIAGNOSIS — J984 Other disorders of lung: Secondary | ICD-10-CM | POA: Insufficient documentation

## 2020-09-20 DIAGNOSIS — I2510 Atherosclerotic heart disease of native coronary artery without angina pectoris: Secondary | ICD-10-CM | POA: Diagnosis not present

## 2020-09-20 DIAGNOSIS — I700 Atherosclerosis of aorta: Secondary | ICD-10-CM | POA: Insufficient documentation

## 2020-09-21 ENCOUNTER — Telehealth: Payer: Self-pay | Admitting: Nurse Practitioner

## 2020-09-21 ENCOUNTER — Telehealth: Payer: Self-pay

## 2020-09-21 DIAGNOSIS — R911 Solitary pulmonary nodule: Secondary | ICD-10-CM | POA: Insufficient documentation

## 2020-09-21 MED ORDER — AZITHROMYCIN 250 MG PO TABS: ORAL_TABLET | ORAL | 0 refills | Status: DC

## 2020-09-21 MED ORDER — PREDNISONE 10 MG PO TABS: ORAL_TABLET | ORAL | 0 refills | Status: AC

## 2020-09-21 NOTE — Telephone Encounter (Signed)
Copied from [place] [phone]. Topic: General - Other >> Sep 21, 2020  3:13 PM Celene Kras wrote: Reason for CRM: Pt called and is requesting to speak with a nurse regarding her CT scan. Please advise.

## 2020-09-21 NOTE — Telephone Encounter (Signed)
Called and discussed results with patient - see telephone encounter

## 2020-09-21 NOTE — Telephone Encounter (Signed)
Do not see a result note for this scan yet. Routing to provider to advise.

## 2020-09-21 NOTE — Telephone Encounter (Signed)
Called patient and discussed lung CT scan results.  Will start on azithromycin and prednisone for ?pneumonia.  Will send note to PCP to order repeat lung CT in 3 months for lung nodules.  All questions answered and patient verbalized appreciation for my call.

## 2020-10-05 ENCOUNTER — Ambulatory Visit (INDEPENDENT_AMBULATORY_CARE_PROVIDER_SITE_OTHER): Payer: PPO | Admitting: Family Medicine

## 2020-10-05 ENCOUNTER — Encounter: Payer: Self-pay | Admitting: Family Medicine

## 2020-10-05 ENCOUNTER — Other Ambulatory Visit: Payer: Self-pay

## 2020-10-05 VITALS — BP 149/94 | HR 62 | Temp 98.3°F | Wt 216.2 lb

## 2020-10-05 DIAGNOSIS — M25551 Pain in right hip: Secondary | ICD-10-CM

## 2020-10-05 DIAGNOSIS — R059 Cough, unspecified: Secondary | ICD-10-CM | POA: Diagnosis not present

## 2020-10-05 DIAGNOSIS — H6121 Impacted cerumen, right ear: Secondary | ICD-10-CM | POA: Diagnosis not present

## 2020-10-05 MED ORDER — DICLOFENAC SODIUM 1 % EX GEL: 4.0000 g | Freq: Four times a day (QID) | CUTANEOUS | 3 refills | Status: DC

## 2020-10-05 NOTE — Patient Instructions (Signed)
Hip Bursitis  Hip bursitis is inflammation of a fluid-filled sac (bursa) in the hip joint. The bursa prevents the bones in the hip joint from rubbing against each other. Hip bursitis can cause mild to moderate pain, and symptoms often come and go over time. What are the causes? This condition may be caused by:  Injury to the hip.  Overuse of the muscles that surround the hip joint.  Previous injury or surgery of the hip.  Arthritis or gout.  Diabetes.  Thyroid disease.  Infection. In some cases, the cause may not be known. What are the signs or symptoms? Symptoms of this condition include:  Mild or moderate pain in the hip area. Pain may get worse with movement.  Tenderness and swelling of the hip, especially on the outer side of the hip.  In rare cases, the bursa may become infected. This may cause a fever, as well as warmth and redness in the area. Symptoms may come and go. How is this diagnosed? This condition may be diagnosed based on:  A physical exam.  Your medical history.  X-rays.  Removal of fluid from your inflamed bursa for testing (biopsy). You may be sent to a health care provider who specializes in bone diseases (orthopedist) or a provider who specializes in joint inflammation (rheumatologist). How is this treated? This condition is treated by resting, icing, applying pressure (compression), and raising (elevating) the injured area. This is called RICE treatment. In some cases, this may be enough to make your symptoms go away. Treatment may also include:  Using crutches.  Draining fluid out of the bursa to help relieve swelling.  Injecting medicine that helps to reduce inflammation (cortisone).  Additional medicines if the bursa is infected. Follow these instructions at home: Managing pain, stiffness, and swelling   If directed, put ice on the painful area. ? Put ice in a plastic bag. ? Place a towel between your skin and the bag. ? Leave the ice  on for 20 minutes, 2-3 times a day. ? Raise (elevate) your hip above the level of your heart as much as you can without pain. To do this, try putting a pillow under your hips while you lie down. Activity  Return to your normal activities as told by your health care provider. Ask your health care provider what activities are safe for you.  Rest and protect your hip as much as possible until your pain and swelling get better. General instructions  Take over-the-counter and prescription medicines only as told by your health care provider.  Wear compression wraps only as told by your health care provider.  Do not use your hip to support your body weight until your health care provider says that you can. Use crutches as told by your health care provider.  Gently massage and stretch your injured area as often as is comfortable.  Keep all follow-up visits as told by your health care provider. This is important. How is this prevented?  Exercise regularly, as told by your health care provider.  Warm up and stretch before being active.  Cool down and stretch after being active.  If an activity irritates your hip or causes pain, avoid the activity as much as possible.  Avoid sitting down for long periods at a time. Contact a health care provider if you:  Have a fever.  Develop new symptoms.  Have difficulty walking or doing everyday activities.  Have pain that gets worse or does not get better with medicine.  Develop red skin or a feeling of warmth in your hip area. Get help right away if you:  Cannot move your hip.  Have severe pain. Summary  Hip bursitis is inflammation of a fluid-filled sac (bursa) in the hip joint.  Hip bursitis can cause mild to moderate pain, and symptoms often come and go over time.  This condition is treated with rest, ice, compression, elevation, and medicines. This information is not intended to replace advice given to you by your health care  provider. Make sure you discuss any questions you have with your health care provider. Document Revised: 06/24/2018 Document Reviewed: 06/24/2018 Elsevier Patient Education  2020 [employer].  Hip Bursitis Rehab Ask your health care provider which exercises are safe for you. Do exercises exactly as told by your health care provider and adjust them as directed. It is normal to feel mild stretching, pulling, tightness, or discomfort as you do these exercises. Stop right away if you feel sudden pain or your pain gets worse. Do not begin these exercises until told by your health care provider. Stretching exercise This exercise warms up your muscles and joints and improves the movement and flexibility of your hip. This exercise also helps to relieve pain and stiffness. Iliotibial band stretch An iliotibial band is a strong band of muscle tissue that runs from the outer side of your hip to the outer side of your thigh and knee. 1. Lie on your side with your left / right leg in the top position. 2. Bend your left / right knee and grab your ankle. Stretch out your bottom arm to help you balance. 3. Slowly bring your knee back so your thigh is behind your body. 4. Slowly lower your knee toward the floor until you feel a gentle stretch on the outside of your left / right thigh. If you do not feel a stretch and your knee will not fall farther, place the heel of your other foot on top of your knee and pull your knee down toward the floor with your foot. 5. Hold this position for __________ seconds. 6. Slowly return to the starting position. Repeat __________ times. Complete this exercise __________ times a day. Strengthening exercises These exercises build strength and endurance in your hip and pelvis. Endurance is the ability to use your muscles for a long time, even after they get tired. Bridge This exercise strengthens the muscles that move your thigh backward (hip extensors). 1. Lie on your back on a  firm surface with your knees bent and your feet flat on the floor. 2. Tighten your buttocks muscles and lift your buttocks off the floor until your trunk is level with your thighs. ? Do not arch your back. ? You should feel the muscles working in your buttocks and the back of your thighs. If you do not feel these muscles, slide your feet 1-2 inches (2.5-5 cm) farther away from your buttocks. ? If this exercise is too easy, try doing it with your arms crossed over your chest. 3. Hold this position for __________ seconds. 4. Slowly lower your hips to the starting position. 5. Let your muscles relax completely after each repetition. Repeat __________ times. Complete this exercise __________ times a day. Squats This exercise strengthens the muscles in front of your thigh and knee (quadriceps). 1. Stand in front of a table, with your feet and knees pointing straight ahead. You may rest your hands on the table for balance but not for support. 2. Slowly bend your  knees and lower your hips like you are going to sit in a chair. ? Keep your weight over your heels, not over your toes. ? Keep your lower legs upright so they are parallel with the table legs. ? Do not let your hips go lower than your knees. ? Do not bend lower than told by your health care provider. ? If your hip pain increases, do not bend as low. 3. Hold the squat position for __________ seconds. 4. Slowly push with your legs to return to standing. Do not use your hands to pull yourself to standing. Repeat __________ times. Complete this exercise __________ times a day. Hip hike 1. Stand sideways on a bottom step. Stand on your left / right leg with your other foot unsupported next to the step. You can hold on to the railing or wall for balance if needed. 2. Keep your knees straight and your torso square. Then lift your left / right hip up toward the ceiling. 3. Hold this position for __________ seconds. 4. Slowly let your left / right  hip lower toward the floor, past the starting position. Your foot should get closer to the floor. Do not lean or bend your knees. Repeat __________ times. Complete this exercise __________ times a day. Single leg stand 1. Without shoes, stand near a railing or in a doorway. You may hold on to the railing or door frame as needed for balance. 2. Squeeze your left / right buttock muscles, then lift up your other foot. ? Do not let your left / right hip push out to the side. ? It is helpful to stand in front of a mirror for this exercise so you can watch your hip. 3. Hold this position for __________ seconds. Repeat __________ times. Complete this exercise __________ times a day. This information is not intended to replace advice given to you by your health care provider. Make sure you discuss any questions you have with your health care provider. Document Revised: 02/10/2019 Document Reviewed: 02/10/2019 Elsevier Patient Education  [employer].

## 2020-10-05 NOTE — Progress Notes (Signed)
BP (!) 149/94   Pulse 62   Temp 98.3 F (36.8 C)   Wt 216 lb 3.2 oz (98.1 kg)   SpO2 100%   BMI 32.39 kg/m    Subjective:    Patient ID: Mckenzie Webster, female    DOB: Mar 20, 1951, 69 y.o.   MRN: [id number]  HPI: Mckenzie Webster is a 69 y.o. female  Chief Complaint  Patient presents with  . Cough  . Cerumen Impaction  . Hip Pain    pt states her hip has been hurting for a few months, pt states the pain is worse when she walks a lot or climbs stairs    CT showed bronchitis, ? Pneumonia as well as nodules. Treated with another course of azithromycin. She is feeling much better. She notes that she still has a little cough- but it is more of a lingering one rather than as bad as it had been.   EAG CLOGGED Duration: couple of weeks Involved ear(s):  "right Sensation of feeling clogged/plugged: yes Decreased/muffled hearing:yes Ear pain: no Fever: no Otorrhea: no Hearing loss: yes Upper respiratory infection symptoms: no Using Q-Tips: no Status: worse History of cerumenosis: yes Treatments attempted: debrox  HIP PAIN Duration: past couple of months Involved hip: right  Mechanism of injury: unknown Location: lateral Onset: gradual  Severity: severe  Quality: sharp Frequency: intermittent Radiation: yes into her back Aggravating factors:laying on her side and movement/stairs    Alleviating factors: rest, heating pad    Status: better Treatments attempted: rest    Relief with NSAIDs?: No NSAIDs Taken Weakness with weight bearing: no Weakness with walking: no Paresthesias / decreased sensation: no Swelling: no Redness:no Fevers: no  Relevant past medical, surgical, family and social history reviewed and updated as indicated. Interim medical history since our last visit reviewed. Allergies and medications reviewed and updated.  Review of Systems  Constitutional: Negative.   HENT: Positive for congestion and hearing loss. Negative for dental problem, drooling,  ear discharge, ear pain, facial swelling, mouth sores, nosebleeds, postnasal drip, rhinorrhea, sinus pressure, sinus pain, sneezing, sore throat, tinnitus, trouble swallowing and voice change.   Respiratory: Positive for cough. Negative for apnea, choking, chest tightness, shortness of breath, wheezing and stridor.   Cardiovascular: Negative.   Gastrointestinal: Negative.   Musculoskeletal: Negative.   Psychiatric/Behavioral: Negative.     Per HPI unless specifically indicated above     Objective:    BP (!) 149/94   Pulse 62   Temp 98.3 F (36.8 C)   Wt 216 lb 3.2 oz (98.1 kg)   SpO2 100%   BMI 32.39 kg/m   Wt Readings from Last 3 Encounters:  10/05/20 216 lb 3.2 oz (98.1 kg)  09/14/20 213 lb (96.6 kg)  06/18/20 217 lb (98.4 kg)    Physical Exam Vitals and nursing note reviewed.  Constitutional:      General: She is not in acute distress.    Appearance: Normal appearance. She is not ill-appearing, toxic-appearing or diaphoretic.  HENT:     Head: Normocephalic and atraumatic.     Right Ear: External ear normal. There is impacted cerumen.     Left Ear: Tympanic membrane, ear canal and external ear normal.     Nose: Nose normal.     Mouth/Throat:     Mouth: Mucous membranes are moist.     Pharynx: Oropharynx is clear.  Eyes:     General: No scleral icterus.       Right eye: No  discharge.        Left eye: No discharge.     Extraocular Movements: Extraocular movements intact.     Conjunctiva/sclera: Conjunctivae normal.     Pupils: Pupils are equal, round, and reactive to light.  Cardiovascular:     Rate and Rhythm: Normal rate and regular rhythm.     Pulses: Normal pulses.     Heart sounds: Normal heart sounds. No murmur heard.  No friction rub. No gallop.   Pulmonary:     Effort: Pulmonary effort is normal. No respiratory distress.     Breath sounds: Normal breath sounds. No stridor. No wheezing, rhonchi or rales.  Chest:     Chest wall: No tenderness.    Musculoskeletal:        General: Normal range of motion.     Cervical back: Normal range of motion and neck supple.     Comments: Tenderness over R trochateric bursa  Skin:    General: Skin is warm and dry.     Capillary Refill: Capillary refill takes less than 2 seconds.     Coloration: Skin is not jaundiced or pale.     Findings: No bruising, erythema, lesion or rash.  Neurological:     General: No focal deficit present.     Mental Status: She is alert and oriented to person, place, and time. Mental status is at baseline.  Psychiatric:        Mood and Affect: Mood normal.        Behavior: Behavior normal.        Thought Content: Thought content normal.        Judgment: Judgment normal.      CLINICAL DATA:  Persistent cough over last 3 months. Some shortness of breath. Twenty year smoking history.  EXAM: CT CHEST WITHOUT CONTRAST  TECHNIQUE: Multidetector CT imaging of the chest was performed following the standard protocol without IV contrast.  COMPARISON:  Chest radiography 09/06/2020 CT abdomen 03/23/2016.  FINDINGS: Cardiovascular: Heart size is normal. No pericardial fluid. Coronary artery calcification is visible. Aortic atherosclerotic calcification is visible.  Mediastinum/Nodes: No sign of mediastinal or hilar lymphadenopathy on this noncontrast exam.  Lungs/Pleura: No pleural effusion. Irregular focal density in the left upper lobe axial image 48 measuring 11 mm in diameter. Two adjacent nodules in the right middle lobe, 5 mm and 4 mm. Numerous areas of bronchial thickening with small pulmonary opacities, most suggestive of bronchitis and minimal pneumonia. Atypical organisms could be considered. No dense consolidation, or lobar collapse. There is not a pattern of pulmonary infiltrate typical of coronavirus infection.  Upper Abdomen: Previous bariatric surgery.  Musculoskeletal: Ordinary thoracic degenerative changes.  IMPRESSION: 1.  Numerous areas of bronchial thickening with small pulmonary opacities, most suggestive of bronchitis and minimal pneumonia. Atypical organisms could be considered. No dense consolidation, lobar collapse or pleural effusion. 2. Irregular focal density in the left upper lobe axial image 48 measuring 11 mm in diameter. Two adjacent nodules in the right middle lobe, 5 mm and 4 mm. These are nonspecific and could be inflammatory or neoplastic. Consider follow-up CT in 3 months. If persistent in 3 months, additional follow-up recommendations will be offered at that time. 3. Aortic atherosclerosis. Coronary artery calcification.  Aortic Atherosclerosis (ICD10-I70.0).  Results for orders placed or performed in visit on 04/08/20  Microalbumin, Urine Waived  Result Value Ref Range   Microalb, Ur Waived 80 (H) 0 - 19 mg/L   Creatinine, Urine Waived 300 10 - 300 mg/dL  Microalb/Creat Ratio <30 <30 mg/g  CBC with Differential/Platelet  Result Value Ref Range   WBC 5.1 3.4 - 10.8 x10E3/uL   RBC 5.09 3.77 - 5.28 x10E6/uL   Hemoglobin 15.1 11.1 - 15.9 g/dL   Hematocrit 44.5 34.0 - 46.6 %   MCV 87 79 - 97 fL   MCH 29.7 26.6 - 33.0 pg   MCHC 33.9 31 - 35 g/dL   RDW 12.9 11.7 - 15.4 %   Platelets 223 150 - 450 x10E3/uL   Neutrophils 72 Not Estab. %   Lymphs 15 Not Estab. %   Monocytes 9 Not Estab. %   Eos 3 Not Estab. %   Basos 1 Not Estab. %   Neutrophils Absolute 3.6 1.40 - 7.00 x10E3/uL   Lymphocytes Absolute 0.8 0 - 3 x10E3/uL   Monocytes Absolute 0.5 0 - 0 x10E3/uL   EOS (ABSOLUTE) 0.2 0.0 - 0.4 x10E3/uL   Basophils Absolute 0.0 0 - 0 x10E3/uL   Immature Granulocytes 0 Not Estab. %   Immature Grans (Abs) 0.0 0.0 - 0.1 x10E3/uL  Comprehensive metabolic panel  Result Value Ref Range   Glucose 83 65 - 99 mg/dL   BUN 25 8 - 27 mg/dL   Creatinine, Ser 0.80 0.57 - 1.00 mg/dL   GFR calc non Af Amer 76 >59 mL/min/1.73   GFR calc Af Amer 88 >59 mL/min/1.73   BUN/Creatinine Ratio 31 (H)  12 - 28   Sodium 139 134 - 144 mmol/L   Potassium 4.2 3.5 - 5.2 mmol/L   Chloride 106 96 - 106 mmol/L   CO2 21 20 - 29 mmol/L   Calcium 9.0 8.7 - 10.3 mg/dL   Total Protein 6.2 6.0 - 8.5 g/dL   Albumin 4.2 3.8 - 4.8 g/dL   Globulin, Total 2.0 1.5 - 4.5 g/dL   Albumin/Globulin Ratio 2.1 1.2 - 2.2   Bilirubin Total 0.5 0.0 - 1.2 mg/dL   Alkaline Phosphatase 103 48 - 121 IU/L   AST 19 0 - 40 IU/L   ALT 12 0 - 32 IU/L  Lipid Panel w/o Chol/HDL Ratio  Result Value Ref Range   Cholesterol, Total 172 100 - 199 mg/dL   Triglycerides 63 0 - 149 mg/dL   HDL 76 >39 mg/dL   VLDL Cholesterol Cal 12 5 - 40 mg/dL   LDL Chol Calc (NIH) 84 0 - 99 mg/dL  TSH  Result Value Ref Range   TSH 4.060 0.450 - 4.500 uIU/mL  Urinalysis, Routine w reflex microscopic  Result Value Ref Range   Specific Gravity, UA >1.030 (H) 1.005 - 1.030   pH, UA 5.0 5.0 - 7.5   Color, UA Yellow Yellow   Appearance Ur Clear Clear   Leukocytes,UA Negative Negative   Protein,UA Negative Negative/Trace   Glucose, UA Negative Negative   Ketones, UA Negative Negative   RBC, UA Negative Negative   Bilirubin, UA Negative Negative   Urobilinogen, Ur 0.2 0.2 - 1.0 mg/dL   Nitrite, UA Negative Negative  Iron and TIBC  Result Value Ref Range   Total Iron Binding Capacity 269 250 - 450 ug/dL   UIBC 180 118 - 369 ug/dL   Iron 89 27 - 139 ug/dL   Iron Saturation 33 15 - 55 %  Ferritin  Result Value Ref Range   Ferritin 74 15.0 - 150.0 ng/mL      Assessment & Plan:   Problem List Items Addressed This Visit      Other   Cough -  Primary    Other Visit Diagnoses    Hearing loss of right ear due to cerumen impaction       Ear flushed today with good results.    Right hip pain       Sounds like 2 issues- bursitis and hip pain. Will treat bursitis with voltaren, if not better in week or 2- willl inject. Continue to monitor. Call with concern       Follow up plan: Return if symptoms worsen or fail to  improve.

## 2020-10-14 ENCOUNTER — Telehealth: Payer: Self-pay

## 2020-10-14 NOTE — Telephone Encounter (Signed)
Schedule pt for  11/08/2020 pt asked  if she could be added on  to providers schedule upon return of provider due to her being under the impression she was able to walk in with out apt to get this done.

## 2020-10-14 NOTE — Telephone Encounter (Signed)
Copied from [place] [phone]. Topic: General - Other >> Oct 14, 2020  8:57 AM Keene Breath wrote: Reason for CRM: Patient called to ask when she would be able to come in and get a shot for her hip pain.  She stated that the doctor told her she did not need an appt. And could just come in at anytime and get the shot.  Please call patient to confirm.  She would like to come in today.  CB# [phone]

## 2020-10-14 NOTE — Telephone Encounter (Signed)
Per Johnson's note this is for a hip injection, so that will have to be done by provider.  Please see if patient would like to schedule an appt

## 2020-10-18 NOTE — Telephone Encounter (Signed)
I can see her wed 10:40

## 2020-10-18 NOTE — Telephone Encounter (Signed)
She does need an appointment. This is OK to book with Dr. Ky Barban for a trochanteric bursa injection

## 2020-10-18 NOTE — Telephone Encounter (Signed)
Per Dr.Rumball " they would need to see an orthopedic or sports medicine specialist" as she does not do hip injections. Did pt need to be scheduled sooner or added on for a sooner apt?

## 2020-10-18 NOTE — Telephone Encounter (Signed)
Appt scheduled, patient notified.

## 2020-10-20 ENCOUNTER — Other Ambulatory Visit: Payer: Self-pay

## 2020-10-20 ENCOUNTER — Encounter: Payer: Self-pay | Admitting: Family Medicine

## 2020-10-20 ENCOUNTER — Ambulatory Visit (INDEPENDENT_AMBULATORY_CARE_PROVIDER_SITE_OTHER): Payer: PPO | Admitting: Family Medicine

## 2020-10-20 VITALS — BP 146/86 | HR 67 | Temp 98.2°F | Wt 215.6 lb

## 2020-10-20 DIAGNOSIS — M7061 Trochanteric bursitis, right hip: Secondary | ICD-10-CM

## 2020-10-20 NOTE — Patient Instructions (Signed)
Hip Bursitis  Hip bursitis is inflammation of a fluid-filled sac (bursa) in the hip joint. The bursa prevents the bones in the hip joint from rubbing against each other. Hip bursitis can cause mild to moderate pain, and symptoms often come and go over time. What are the causes? This condition may be caused by:  Injury to the hip.  Overuse of the muscles that surround the hip joint.  Previous injury or surgery of the hip.  Arthritis or gout.  Diabetes.  Thyroid disease.  Infection. In some cases, the cause may not be known. What are the signs or symptoms? Symptoms of this condition include:  Mild or moderate pain in the hip area. Pain may get worse with movement.  Tenderness and swelling of the hip, especially on the outer side of the hip.  In rare cases, the bursa may become infected. This may cause a fever, as well as warmth and redness in the area. Symptoms may come and go. How is this diagnosed? This condition may be diagnosed based on:  A physical exam.  Your medical history.  X-rays.  Removal of fluid from your inflamed bursa for testing (biopsy). You may be sent to a health care provider who specializes in bone diseases (orthopedist) or a provider who specializes in joint inflammation (rheumatologist). How is this treated? This condition is treated by resting, icing, applying pressure (compression), and raising (elevating) the injured area. This is called RICE treatment. In some cases, this may be enough to make your symptoms go away. Treatment may also include:  Using crutches.  Draining fluid out of the bursa to help relieve swelling.  Injecting medicine that helps to reduce inflammation (cortisone).  Additional medicines if the bursa is infected. Follow these instructions at home: Managing pain, stiffness, and swelling   If directed, put ice on the painful area. ? Put ice in a plastic bag. ? Place a towel between your skin and the bag. ? Leave the ice  on for 20 minutes, 2-3 times a day. ? Raise (elevate) your hip above the level of your heart as much as you can without pain. To do this, try putting a pillow under your hips while you lie down. Activity  Return to your normal activities as told by your health care provider. Ask your health care provider what activities are safe for you.  Rest and protect your hip as much as possible until your pain and swelling get better. General instructions  Take over-the-counter and prescription medicines only as told by your health care provider.  Wear compression wraps only as told by your health care provider.  Do not use your hip to support your body weight until your health care provider says that you can. Use crutches as told by your health care provider.  Gently massage and stretch your injured area as often as is comfortable.  Keep all follow-up visits as told by your health care provider. This is important. How is this prevented?  Exercise regularly, as told by your health care provider.  Warm up and stretch before being active.  Cool down and stretch after being active.  If an activity irritates your hip or causes pain, avoid the activity as much as possible.  Avoid sitting down for long periods at a time. Contact a health care provider if you:  Have a fever.  Develop new symptoms.  Have difficulty walking or doing everyday activities.  Have pain that gets worse or does not get better with medicine.    Develop red skin or a feeling of warmth in your hip area. Get help right away if you:  Cannot move your hip.  Have severe pain. Summary  Hip bursitis is inflammation of a fluid-filled sac (bursa) in the hip joint.  Hip bursitis can cause mild to moderate pain, and symptoms often come and go over time.  This condition is treated with rest, ice, compression, elevation, and medicines. This information is not intended to replace advice given to you by your health care  provider. Make sure you discuss any questions you have with your health care provider. Document Revised: 06/24/2018 Document Reviewed: 06/24/2018 Elsevier Patient Education  2020 [employer]  Hip Bursitis Rehab Ask your health care provider which exercises are safe for you. Do exercises exactly as told by your health care provider and adjust them as directed. It is normal to feel mild stretching, pulling, tightness, or discomfort as you do these exercises. Stop right away if you feel sudden pain or your pain gets worse. Do not begin these exercises until told by your health care provider. Stretching exercise This exercise warms up your muscles and joints and improves the movement and flexibility of your hip. This exercise also helps to relieve pain and stiffness. Iliotibial band stretch An iliotibial band is a strong band of muscle tissue that runs from the outer side of your hip to the outer side of your thigh and knee. 1. Lie on your side with your left / right leg in the top position. 2. Bend your left / right knee and grab your ankle. Stretch out your bottom arm to help you balance. 3. Slowly bring your knee back so your thigh is behind your body. 4. Slowly lower your knee toward the floor until you feel a gentle stretch on the outside of your left / right thigh. If you do not feel a stretch and your knee will not fall farther, place the heel of your other foot on top of your knee and pull your knee down toward the floor with your foot. 5. Hold this position for __________ seconds. 6. Slowly return to the starting position. Repeat __________ times. Complete this exercise __________ times a day. Strengthening exercises These exercises build strength and endurance in your hip and pelvis. Endurance is the ability to use your muscles for a long time, even after they get tired. Bridge This exercise strengthens the muscles that move your thigh backward (hip extensors). 1. Lie on your back on a  firm surface with your knees bent and your feet flat on the floor. 2. Tighten your buttocks muscles and lift your buttocks off the floor until your trunk is level with your thighs. ? Do not arch your back. ? You should feel the muscles working in your buttocks and the back of your thighs. If you do not feel these muscles, slide your feet 1-2 inches (2.5-5 cm) farther away from your buttocks. ? If this exercise is too easy, try doing it with your arms crossed over your chest. 3. Hold this position for __________ seconds. 4. Slowly lower your hips to the starting position. 5. Let your muscles relax completely after each repetition. Repeat __________ times. Complete this exercise __________ times a day. Squats This exercise strengthens the muscles in front of your thigh and knee (quadriceps). 1. Stand in front of a table, with your feet and knees pointing straight ahead. You may rest your hands on the table for balance but not for support. 2. Slowly bend your   knees and lower your hips like you are going to sit in a chair. ? Keep your weight over your heels, not over your toes. ? Keep your lower legs upright so they are parallel with the table legs. ? Do not let your hips go lower than your knees. ? Do not bend lower than told by your health care provider. ? If your hip pain increases, do not bend as low. 3. Hold the squat position for __________ seconds. 4. Slowly push with your legs to return to standing. Do not use your hands to pull yourself to standing. Repeat __________ times. Complete this exercise __________ times a day. Hip hike 1. Stand sideways on a bottom step. Stand on your left / right leg with your other foot unsupported next to the step. You can hold on to the railing or wall for balance if needed. 2. Keep your knees straight and your torso square. Then lift your left / right hip up toward the ceiling. 3. Hold this position for __________ seconds. 4. Slowly let your left / right  hip lower toward the floor, past the starting position. Your foot should get closer to the floor. Do not lean or bend your knees. Repeat __________ times. Complete this exercise __________ times a day. Single leg stand 1. Without shoes, stand near a railing or in a doorway. You may hold on to the railing or door frame as needed for balance. 2. Squeeze your left / right buttock muscles, then lift up your other foot. ? Do not let your left / right hip push out to the side. ? It is helpful to stand in front of a mirror for this exercise so you can watch your hip. 3. Hold this position for __________ seconds. Repeat __________ times. Complete this exercise __________ times a day. This information is not intended to replace advice given to you by your health care provider. Make sure you discuss any questions you have with your health care provider. Document Revised: 02/10/2019 Document Reviewed: 02/10/2019 Elsevier Patient Education  2020 [employer]  

## 2020-10-20 NOTE — Progress Notes (Signed)
BP (!) 146/86   Pulse 67   Temp 98.2 F (36.8 C)   Wt 215 lb 9.6 oz (97.8 kg)   SpO2 99%   BMI 32.31 kg/m    Subjective:    Patient ID: Mckenzie Webster, female    DOB: 1951/10/09, 69 y.o.   MRN: [id number]  HPI: Mckenzie Webster is a 69 y.o. female  Chief Complaint  Patient presents with  . Hip Pain   HIP PAIN Duration: past couple of months Involved hip: right  Mechanism of injury: unknown Location: lateral Onset: gradual  Severity: severe  Quality: sharp Frequency: intermittent Radiation: yes into her back Aggravating factors: Laying on her side and movement/stairs    Alleviating factors:rest, heating pad, voltaren   Status: stable Treatments attempted: rest, ice, heat, APAP, ibuprofen and aleve   Relief with NSAIDs?: no Weakness with weight bearing: no Weakness with walking: no Paresthesias / decreased sensation: no Swelling: no Redness:no Fevers: no  Relevant past medical, surgical, family and social history reviewed and updated as indicated. Interim medical history since our last visit reviewed. Allergies and medications reviewed and updated.  Review of Systems  Constitutional: Negative.   Respiratory: Negative.   Cardiovascular: Negative.   Gastrointestinal: Negative.   Musculoskeletal: Positive for arthralgias. Negative for back pain, gait problem, joint swelling, myalgias, neck pain and neck stiffness.  Neurological: Negative.   Psychiatric/Behavioral: Negative.     Per HPI unless specifically indicated above     Objective:    BP (!) 146/86   Pulse 67   Temp 98.2 F (36.8 C)   Wt 215 lb 9.6 oz (97.8 kg)   SpO2 99%   BMI 32.31 kg/m   Wt Readings from Last 3 Encounters:  10/20/20 215 lb 9.6 oz (97.8 kg)  10/05/20 216 lb 3.2 oz (98.1 kg)  09/14/20 213 lb (96.6 kg)    Physical Exam Vitals and nursing note reviewed.  Constitutional:      General: She is not in acute distress.    Appearance: Normal appearance. She is not ill-appearing,  toxic-appearing or diaphoretic.  HENT:     Head: Normocephalic and atraumatic.     Right Ear: External ear normal.     Left Ear: External ear normal.     Nose: Nose normal.     Mouth/Throat:     Mouth: Mucous membranes are moist.     Pharynx: Oropharynx is clear.  Eyes:     General: No scleral icterus.       Right eye: No discharge.        Left eye: No discharge.     Extraocular Movements: Extraocular movements intact.     Conjunctiva/sclera: Conjunctivae normal.     Pupils: Pupils are equal, round, and reactive to light.  Cardiovascular:     Rate and Rhythm: Normal rate and regular rhythm.     Pulses: Normal pulses.     Heart sounds: Normal heart sounds. No murmur heard. No friction rub. No gallop.   Pulmonary:     Effort: Pulmonary effort is normal. No respiratory distress.     Breath sounds: Normal breath sounds. No stridor. No wheezing, rhonchi or rales.  Chest:     Chest wall: No tenderness.  Musculoskeletal:        General: Normal range of motion.     Cervical back: Normal range of motion and neck supple.     Comments: Tenderness over R trochanteric bursa  Skin:    General: Skin is warm  and dry.     Capillary Refill: Capillary refill takes less than 2 seconds.     Coloration: Skin is not jaundiced or pale.     Findings: No bruising, erythema, lesion or rash.  Neurological:     General: No focal deficit present.     Mental Status: She is alert and oriented to person, place, and time. Mental status is at baseline.  Psychiatric:        Mood and Affect: Mood normal.        Behavior: Behavior normal.        Thought Content: Thought content normal.        Judgment: Judgment normal.     Results for orders placed or performed in visit on 04/08/20  Microalbumin, Urine Waived  Result Value Ref Range   Microalb, Ur Waived 80 (H) 0 - 19 mg/L   Creatinine, Urine Waived 300 10 - 300 mg/dL   Microalb/Creat Ratio <30 <30 mg/g  CBC with Differential/Platelet  Result Value  Ref Range   WBC 5.1 3.4 - 10.8 x10E3/uL   RBC 5.09 3.77 - 5.28 x10E6/uL   Hemoglobin 15.1 11.1 - 15.9 g/dL   Hematocrit 44.5 34.0 - 46.6 %   MCV 87 79 - 97 fL   MCH 29.7 26.6 - 33.0 pg   MCHC 33.9 31.5 - 35.7 g/dL   RDW 12.9 11.7 - 15.4 %   Platelets 223 150 - 450 x10E3/uL   Neutrophils 72 Not Estab. %   Lymphs 15 Not Estab. %   Monocytes 9 Not Estab. %   Eos 3 Not Estab. %   Basos 1 Not Estab. %   Neutrophils Absolute 3.6 1.4 - 7.0 x10E3/uL   Lymphocytes Absolute 0.8 0.7 - 3.1 x10E3/uL   Monocytes Absolute 0.5 0.1 - 0.9 x10E3/uL   EOS (ABSOLUTE) 0.2 0.0 - 0.4 x10E3/uL   Basophils Absolute 0.0 0.0 - 0.2 x10E3/uL   Immature Granulocytes 0 Not Estab. %   Immature Grans (Abs) 0.0 0.0 - 0.1 x10E3/uL  Comprehensive metabolic panel  Result Value Ref Range   Glucose 83 65 - 99 mg/dL   BUN 25 8 - 27 mg/dL   Creatinine, Ser 0.80 0.57 - 1.00 mg/dL   GFR calc non Af Amer 76 >59 mL/min/1.73   GFR calc Af Amer 88 >59 mL/min/1.73   BUN/Creatinine Ratio 31 (H) 12 - 28   Sodium 139 134 - 144 mmol/L   Potassium 4.2 3.5 - 5.2 mmol/L   Chloride 106 96 - 106 mmol/L   CO2 21 20 - 29 mmol/L   Calcium 9.0 8.7 - 10.3 mg/dL   Total Protein 6.2 6.0 - 8.5 g/dL   Albumin 4.2 3.8 - 4.8 g/dL   Globulin, Total 2.0 1.5 - 4.5 g/dL   Albumin/Globulin Ratio 2.1 1.2 - 2.2   Bilirubin Total 0.5 0.0 - 1.2 mg/dL   Alkaline Phosphatase 103 48 - 121 IU/L   AST 19 0 - 40 IU/L   ALT 12 0 - 32 IU/L  Lipid Panel w/o Chol/HDL Ratio  Result Value Ref Range   Cholesterol, Total 172 100 - 199 mg/dL   Triglycerides 63 0 - 149 mg/dL   HDL 76 >39 mg/dL   VLDL Cholesterol Cal 12 5 - 40 mg/dL   LDL Chol Calc (NIH) 84 0 - 99 mg/dL  TSH  Result Value Ref Range   TSH 4.060 0.450 - 4.500 uIU/mL  Urinalysis, Routine w reflex microscopic  Result Value Ref Range   Specific  Gravity, UA >1.030 (H) 1.005 - 1.030   pH, UA 5.0 5.0 - 7.5   Color, UA Yellow Yellow   Appearance Ur Clear Clear   Leukocytes,UA Negative Negative    Protein,UA Negative Negative/Trace   Glucose, UA Negative Negative   Ketones, UA Negative Negative   RBC, UA Negative Negative   Bilirubin, UA Negative Negative   Urobilinogen, Ur 0.2 0.2 - 1.0 mg/dL   Nitrite, UA Negative Negative  Iron and TIBC  Result Value Ref Range   Total Iron Binding Capacity 269 250 - 450 ug/dL   UIBC 180 118 - 369 ug/dL   Iron 89 27 - 139 ug/dL   Iron Saturation 33 15 - 55 %  Ferritin  Result Value Ref Range   Ferritin 74 15 - 150 ng/mL      Assessment & Plan:   Problem List Items Addressed This Visit   None   Visit Diagnoses    Trochanteric bursitis of right hip    -  Primary   Injected today as below. Follow up as needed.       Procedure: Right Trochanteric Bursitis Steroid Injection        Diagnosis:   ICD-10-CM   1. Trochanteric bursitis of right hip  M70.61    Injected today as below. Follow up as needed.     Physician: MJ Consent:  Risks, benefits, and alternative treatments discussed and all questions were answered.  Patient elected to proceed and verbal consent obtained.  Description: Area prepped and draped using  semi-sterile technique.  After identifying area of maximal tenderness, A mixture of 3cc 1% lidocaine and 1cc Kenalog 40 was injected.  A bandage was then placed over the injection site. Complications: none Post Procedure Instructions: Wound care instructions discussed and patient was instructed to keep area clean and dry.  Signs and symptoms of infection discussed, patient agrees to contact the office ASAP should they occur.  Follow Up: Return As scheduled.   Follow up plan: Return As scheduled.

## 2020-11-08 ENCOUNTER — Ambulatory Visit: Payer: PPO | Admitting: Family Medicine

## 2020-11-08 ENCOUNTER — Telehealth: Payer: Self-pay | Admitting: Family Medicine

## 2020-11-08 DIAGNOSIS — Z03818 Encounter for observation for suspected exposure to other biological agents ruled out: Secondary | ICD-10-CM | POA: Diagnosis not present

## 2020-11-08 DIAGNOSIS — Z20822 Contact with and (suspected) exposure to covid-19: Secondary | ICD-10-CM | POA: Diagnosis not present

## 2020-11-09 ENCOUNTER — Telehealth: Payer: PPO | Admitting: Family Medicine

## 2020-11-09 NOTE — Progress Notes (Deleted)
Virtual Visit via Video Note  I connected with Mckenzie Webster on 11/09/20 at  2:40 PM EST by a video enabled telemedicine application and verified that I am speaking with the correct person using two identifiers.  Location: Patient: *** Provider: ***   I discussed the limitations of evaluation and management by telemedicine and the availability of in person appointments. The patient expressed understanding and agreed to proceed.  History of Present Illness:  UPPER RESPIRATORY TRACT INFECTION Worst symptom: Fever: {Blank single:19197::"yes","no"} Cough: {Blank single:19197::"yes","no"} Shortness of breath: {Blank single:19197::"yes","no"} Wheezing: {Blank single:19197::"yes","no"} Chest pain: {Blank single:19197::"yes","no","yes, with cough"} Chest tightness: {Blank single:19197::"yes","no"} Chest congestion: {Blank single:19197::"yes","no"} Nasal congestion: {Blank single:19197::"yes","no"} Runny nose: {Blank single:19197::"yes","no"} Post nasal drip: {Blank single:19197::"yes","no"} Sneezing: {Blank single:19197::"yes","no"} Sore throat: {Blank single:19197::"yes","no"} Swollen glands: {Blank single:19197::"yes","no"} Sinus pressure: {Blank single:19197::"yes","no"} Headache: {Blank single:19197::"yes","no"} Face pain: {Blank single:19197::"yes","no"} Toothache: {Blank single:19197::"yes","no"} Ear pain: {Blank single:19197::"yes","no"} {Blank single:19197::""right","left","bilateral"} Ear pressure: {Blank single:19197::"yes","no"} {Blank single:19197::""right","left","bilateral"} Eyes red/itching:{Blank single:19197::"yes","no"} Eye drainage/crusting: {Blank single:19197::"yes","no"}  Vomiting: {Blank single:19197::"yes","no"} Rash: {Blank single:19197::"yes","no"} Fatigue: {Blank single:19197::"yes","no"} Sick contacts: {Blank single:19197::"yes","no"} Strep contacts: {Blank single:19197::"yes","no"}  Context: {Blank  multiple:19196::"better","worse","stable","fluctuating"} Recurrent sinusitis: {Blank single:19197::"yes","no"} Relief with OTC cold/cough medications: {Blank single:19197::"yes","no"}  Treatments attempted: {Blank multiple:19196::"none","cold/sinus","mucinex","anti-histamine","pseudoephedrine","cough syrup","antibiotics"}     Observations/Objective:   Assessment and Plan:   Follow Up Instructions:    I discussed the assessment and treatment plan with the patient. The patient was provided an opportunity to ask questions and all were answered. The patient agreed with the plan and demonstrated an understanding of the instructions.   The patient was advised to call back or seek an in-person evaluation if the symptoms worsen or if the condition fails to improve as anticipated.  I provided *** minutes of non-face-to-face time during this encounter.   Myles Gip, DO

## 2020-11-19 ENCOUNTER — Telehealth: Payer: Self-pay | Admitting: Family Medicine

## 2020-11-19 DIAGNOSIS — R911 Solitary pulmonary nodule: Secondary | ICD-10-CM

## 2020-11-19 NOTE — Telephone Encounter (Signed)
Patient notified

## 2020-11-19 NOTE — Telephone Encounter (Signed)
Please let her know that we've ordered her repeat chest CT. They will be calling her to get it scheduled.

## 2020-11-19 NOTE — Telephone Encounter (Signed)
-----   Message from Mckenzie Bear, NP sent at 09/21/2020  5:07 PM EST ----- Regarding: Lung CT Dr. Wynetta Emery,  [facility] you are having a great day!!   Please remember to order this patient's repeat CT lung for monitoring of the lung nodules that were found 08/2020.

## 2020-12-02 DIAGNOSIS — L920 Granuloma annulare: Secondary | ICD-10-CM | POA: Diagnosis not present

## 2020-12-02 DIAGNOSIS — Z79899 Other long term (current) drug therapy: Secondary | ICD-10-CM | POA: Diagnosis not present

## 2020-12-02 DIAGNOSIS — R21 Rash and other nonspecific skin eruption: Secondary | ICD-10-CM | POA: Diagnosis not present

## 2020-12-03 ENCOUNTER — Other Ambulatory Visit: Payer: Self-pay

## 2020-12-03 ENCOUNTER — Ambulatory Visit
Admission: RE | Admit: 2020-12-03 | Discharge: 2020-12-03 | Disposition: A | Discharge status: Home / Self Care | Payer: PPO | Source: Ambulatory Visit | Attending: Family Medicine | Admitting: Family Medicine

## 2020-12-03 DIAGNOSIS — R918 Other nonspecific abnormal finding of lung field: Secondary | ICD-10-CM | POA: Diagnosis not present

## 2020-12-03 DIAGNOSIS — R911 Solitary pulmonary nodule: Secondary | ICD-10-CM

## 2020-12-03 DIAGNOSIS — I700 Atherosclerosis of aorta: Secondary | ICD-10-CM | POA: Insufficient documentation

## 2020-12-03 DIAGNOSIS — R059 Cough, unspecified: Secondary | ICD-10-CM | POA: Insufficient documentation

## 2020-12-03 DIAGNOSIS — Z122 Encounter for screening for malignant neoplasm of respiratory organs: Secondary | ICD-10-CM | POA: Insufficient documentation

## 2020-12-07 ENCOUNTER — Encounter: Payer: Self-pay | Admitting: Family Medicine

## 2020-12-07 ENCOUNTER — Other Ambulatory Visit: Payer: Self-pay

## 2020-12-07 ENCOUNTER — Ambulatory Visit (INDEPENDENT_AMBULATORY_CARE_PROVIDER_SITE_OTHER): Payer: PPO | Admitting: Family Medicine

## 2020-12-07 VITALS — BP 161/85 | HR 68 | Temp 97.8°F | Wt 220.2 lb

## 2020-12-07 DIAGNOSIS — F339 Major depressive disorder, recurrent, unspecified: Secondary | ICD-10-CM | POA: Diagnosis not present

## 2020-12-07 DIAGNOSIS — F5081 Binge eating disorder: Secondary | ICD-10-CM

## 2020-12-07 DIAGNOSIS — I129 Hypertensive chronic kidney disease with stage 1 through stage 4 chronic kidney disease, or unspecified chronic kidney disease: Secondary | ICD-10-CM | POA: Diagnosis not present

## 2020-12-07 DIAGNOSIS — H9190 Unspecified hearing loss, unspecified ear: Secondary | ICD-10-CM

## 2020-12-07 MED ORDER — LISINOPRIL 10 MG PO TABS: 10.0000 mg | ORAL_TABLET | Freq: Every day | ORAL | 3 refills | Status: DC

## 2020-12-07 MED ORDER — LISDEXAMFETAMINE DIMESYLATE 60 MG PO CAPS: 60.0000 mg | ORAL_CAPSULE | ORAL | 0 refills | Status: DC

## 2020-12-07 NOTE — Assessment & Plan Note (Signed)
Under good control on current regimen. Continue current regimen. Continue to monitor. Call with any concerns. Refills up to date.   

## 2020-12-07 NOTE — Assessment & Plan Note (Signed)
Does not want to decrease or come off her vyvanse as it has really been helping with the binge eating. We will start lisinopril and recheck 1 month. Call with any concerns.

## 2020-12-07 NOTE — Progress Notes (Signed)
BP (!) 161/85   Pulse 68   Temp 97.8 F (36.6 C)   Wt 220 lb 3.2 oz (99.9 kg)   SpO2 99%   BMI 33.48 kg/m    Subjective:    Patient ID: Mckenzie Webster, female    DOB: 1951/08/30, 70 y.o.   MRN: [id number]  HPI: Mckenzie Webster is a 70 y.o. female  Chief Complaint  Patient presents with  . binge eating disorder    Needs refill on medication today   . Depression   Feels like the vyvanse is doing well. She has been able to maintain her weight. She still has issues with binging at night, but she has remained pretty stable.   DEPRESSION Mood status: controlled Satisfied with current treatment?: yes Symptom severity: mild  Duration of current treatment : chronic Side effects: no Medication compliance: excellent compliance Psychotherapy/counseling: no  Previous psychiatric medications: sertraline Depressed mood: no Anxious mood: no Anhedonia: no Significant weight loss or gain: no Insomnia: no  Fatigue: no Feelings of worthlessness or guilt: no Impaired concentration/indecisiveness: no Suicidal ideations: no Hopelessness: no Crying spells: no Depression screen [facility] 2/9 12/07/2020 09/14/2020 05/21/2020 04/08/2020 03/22/2020  Decreased Interest 0 0 1 1 0  Down, Depressed, Hopeless 0 0 1 1 0  PHQ - 2 Score 0 0 2 2 0  Altered sleeping 0 0 2 1 -  Tired, decreased energy 0 1 1 3  -  Change in appetite 1 0 2 2 -  Feeling bad or failure about yourself  0 0 2 1 -  Trouble concentrating 0 0 0 1 -  Moving slowly or fidgety/restless 0 0 0 1 -  Suicidal thoughts 0 0 0 0 -  PHQ-9 Score 1 1 9 11  -  Difficult doing work/chores Not difficult at all - Somewhat difficult Very difficult -  Some recent data might be hidden    ELEVATED BLOOD PRESSURE Duration of elevated BP: chronic BP monitoring frequency: not checking Previous BP meds: no Recent stressors: yes Family history of hypertension: yes Recurrent headaches: no Visual changes: no Palpitations: no  Dyspnea: no Chest pain:  no Lower extremity edema: no Dizzy/lightheaded: no Transient ischemic attacks: no  Relevant past medical, surgical, family and social history reviewed and updated as indicated. Interim medical history since our last visit reviewed. Allergies and medications reviewed and updated.  Review of Systems  Constitutional: Negative.   Respiratory: Negative.   Cardiovascular: Negative.   Gastrointestinal: Negative.   Musculoskeletal: Negative.   Neurological: Negative.   Psychiatric/Behavioral: Negative.     Per HPI unless specifically indicated above     Objective:    BP (!) 161/85   Pulse 68   Temp 97.8 F (36.6 C)   Wt 220 lb 3.2 oz (99.9 kg)   SpO2 99%   BMI 33.48 kg/m   Wt Readings from Last 3 Encounters:  12/07/20 220 lb 3.2 oz (99.9 kg)  12/03/20 215 lb (97.5 kg)  10/20/20 215 lb 9.6 oz (97.8 kg)    Physical Exam Vitals and nursing note reviewed.  Constitutional:      General: She is not in acute distress.    Appearance: Normal appearance. She is not ill-appearing, toxic-appearing or diaphoretic.  HENT:     Head: Normocephalic and atraumatic.     Right Ear: External ear normal.     Left Ear: External ear normal.     Nose: Nose normal.     Mouth/Throat:     Mouth: Mucous membranes are  moist.     Pharynx: Oropharynx is clear.  Eyes:     General: No scleral icterus.       Right eye: No discharge.        Left eye: No discharge.     Extraocular Movements: Extraocular movements intact.     Conjunctiva/sclera: Conjunctivae normal.     Pupils: Pupils are equal, round, and reactive to light.  Cardiovascular:     Rate and Rhythm: Normal rate and regular rhythm.     Pulses: Normal pulses.     Heart sounds: Normal heart sounds. No murmur heard. No friction rub. No gallop.   Pulmonary:     Effort: Pulmonary effort is normal. No respiratory distress.     Breath sounds: Normal breath sounds. No stridor. No wheezing, rhonchi or rales.  Chest:     Chest wall: No  tenderness.  Musculoskeletal:        General: Normal range of motion.     Cervical back: Normal range of motion and neck supple.  Skin:    General: Skin is warm and dry.     Capillary Refill: Capillary refill takes less than 2 seconds.     Coloration: Skin is not jaundiced or pale.     Findings: No bruising, erythema, lesion or rash.  Neurological:     General: No focal deficit present.     Mental Status: She is alert and oriented to person, place, and time. Mental status is at baseline.  Psychiatric:        Mood and Affect: Mood normal.        Behavior: Behavior normal.        Thought Content: Thought content normal.        Judgment: Judgment normal.     Results for orders placed or performed in visit on 04/08/20  Microalbumin, Urine Waived  Result Value Ref Range   Microalb, Ur Waived 80 (H) 0 - 19 mg/L   Creatinine, Urine Waived 300 10 - 300 mg/dL   Microalb/Creat Ratio <30 <30 mg/g  CBC with Differential/Platelet  Result Value Ref Range   WBC 5.1 3.4 - 10.8 x10E3/uL   RBC 5.09 3.77 - 5.28 x10E6/uL   Hemoglobin 15.1 11.1 - 15.9 g/dL   Hematocrit 44.5 34.0 - 46.6 %   MCV 87 79 - 97 fL   MCH 29.7 26.6 - 33.0 pg   MCHC 33.9 31.5 - 35.7 g/dL   RDW 12.9 11.7 - 15.4 %   Platelets 223 150 - 450 x10E3/uL   Neutrophils 72 Not Estab. %   Lymphs 15 Not Estab. %   Monocytes 9 Not Estab. %   Eos 3 Not Estab. %   Basos 1 Not Estab. %   Neutrophils Absolute 3.6 1.4 - 7.0 x10E3/uL   Lymphocytes Absolute 0.8 0.7 - 3.1 x10E3/uL   Monocytes Absolute 0.5 0.1 - 0.9 x10E3/uL   EOS (ABSOLUTE) 0.2 0.0 - 0.4 x10E3/uL   Basophils Absolute 0.0 0.0 - 0.2 x10E3/uL   Immature Granulocytes 0 Not Estab. %   Immature Grans (Abs) 0.0 0.0 - 0.1 x10E3/uL  Comprehensive metabolic panel  Result Value Ref Range   Glucose 83 65 - 99 mg/dL   BUN 25 8 - 27 mg/dL   Creatinine, Ser 0.80 0.57 - 1.00 mg/dL   GFR calc non Af Amer 76 >59 mL/min/1.73   GFR calc Af Amer 88 >59 mL/min/1.73   BUN/Creatinine  Ratio 31 (H) 12 - 28   Sodium 139 134 - 144  mmol/L   Potassium 4.2 3.5 - 5.2 mmol/L   Chloride 106 96 - 106 mmol/L   CO2 21 20 - 29 mmol/L   Calcium 9.0 8.7 - 10.3 mg/dL   Total Protein 6.2 6.0 - 8.5 g/dL   Albumin 4.2 3.8 - 4.8 g/dL   Globulin, Total 2.0 1.5 - 4.5 g/dL   Albumin/Globulin Ratio 2.1 1.2 - 2.2   Bilirubin Total 0.5 0.0 - 1.2 mg/dL   Alkaline Phosphatase 103 48 - 121 IU/L   AST 19 0 - 40 IU/L   ALT 12 0 - 32 IU/L  Lipid Panel w/o Chol/HDL Ratio  Result Value Ref Range   Cholesterol, Total 172 100 - 199 mg/dL   Triglycerides 63 0 - 149 mg/dL   HDL 76 >39 mg/dL   VLDL Cholesterol Cal 12 5 - 40 mg/dL   LDL Chol Calc (NIH) 84 0 - 99 mg/dL  TSH  Result Value Ref Range   TSH 4.060 0.450 - 4.500 uIU/mL  Urinalysis, Routine w reflex microscopic  Result Value Ref Range   Specific Gravity, UA >1.030 (H) 1.005 - 1.030   pH, UA 5.0 5.0 - 7.5   Color, UA Yellow Yellow   Appearance Ur Clear Clear   Leukocytes,UA Negative Negative   Protein,UA Negative Negative/Trace   Glucose, UA Negative Negative   Ketones, UA Negative Negative   RBC, UA Negative Negative   Bilirubin, UA Negative Negative   Urobilinogen, Ur 0.2 0.2 - 1.0 mg/dL   Nitrite, UA Negative Negative  Iron and TIBC  Result Value Ref Range   Total Iron Binding Capacity 269 250 - 450 ug/dL   UIBC 180 118 - 369 ug/dL   Iron 89 27 - 139 ug/dL   Iron Saturation 33 15 - 55 %  Ferritin  Result Value Ref Range   Ferritin 74 15 - 150 ng/mL      Assessment & Plan:   Problem List Items Addressed This Visit      Genitourinary   Benign hypertensive renal disease    Does not want to decrease or come off her vyvanse as it has really been helping with the binge eating. We will start lisinopril and recheck 1 month. Call with any concerns.         Other   Binge eating disorder    Under good control on current regimen. Continue current regimen. Continue to monitor. Call with any concerns. Refills given for 3 months.  Follow up 3 months.        Depression, recurrent ([place])    Under good control on current regimen. Continue current regimen. Continue to monitor. Call with any concerns. Refills up to date.         Other Visit Diagnoses    Hearing loss, unspecified hearing loss type, unspecified laterality    -  Primary   Would like further evaluation. Referral to audiology placed today.   Relevant Orders   Ambulatory referral to Audiology       Follow up plan: Return in about 4 weeks (around 01/04/2021).

## 2020-12-07 NOTE — Assessment & Plan Note (Signed)
Under good control on current regimen. Continue current regimen. Continue to monitor. Call with any concerns. Refills given for 3 months. Follow up 3 months.    

## 2021-01-06 ENCOUNTER — Other Ambulatory Visit: Payer: Self-pay

## 2021-01-06 ENCOUNTER — Encounter: Payer: Self-pay | Admitting: Family Medicine

## 2021-01-06 ENCOUNTER — Ambulatory Visit (INDEPENDENT_AMBULATORY_CARE_PROVIDER_SITE_OTHER): Payer: PPO | Admitting: Family Medicine

## 2021-01-06 VITALS — BP 118/80 | HR 71 | Temp 97.9°F | Wt 220.8 lb

## 2021-01-06 DIAGNOSIS — I129 Hypertensive chronic kidney disease with stage 1 through stage 4 chronic kidney disease, or unspecified chronic kidney disease: Secondary | ICD-10-CM

## 2021-01-06 DIAGNOSIS — M7061 Trochanteric bursitis, right hip: Secondary | ICD-10-CM

## 2021-01-06 DIAGNOSIS — R251 Tremor, unspecified: Secondary | ICD-10-CM

## 2021-01-06 MED ORDER — LISINOPRIL 10 MG PO TABS: 10.0000 mg | ORAL_TABLET | Freq: Every day | ORAL | 1 refills | Status: DC

## 2021-01-06 NOTE — Progress Notes (Signed)
BP 118/80   Pulse 71   Temp 97.9 F (36.6 C)   Wt 220 lb 12.8 oz (100.2 kg)   SpO2 100%   BMI 33.57 kg/m    Subjective:    Patient ID: Mckenzie Webster, female    DOB: [birth year], 70 y.o.   MRN: [id number]  HPI: Mckenzie Webster is a 70 y.o. female  Chief Complaint  Patient presents with  . Hypertension   HYPERTENSION Hypertension status: controlled  Satisfied with current treatment? yes Duration of hypertension: months BP monitoring frequency:  not checking BP medication side effects:  no Medication compliance: excellent compliance Previous BP meds: lisinopril Aspirin: no Recurrent headaches: no Visual changes: no Palpitations: no Dyspnea: no Chest pain: no Lower extremity edema: no Dizzy/lightheaded: no   Has noticed a fine tremor when playing games on her phone. Not bothering her particularly now, but does want to make [place] aware of it.    Relevant past medical, surgical, family and social history reviewed and updated as indicated. Interim medical history since our last visit reviewed. Allergies and medications reviewed and updated.  Review of Systems  Constitutional: Negative.   Respiratory: Negative.   Cardiovascular: Negative.   Gastrointestinal: Negative.   Musculoskeletal: Negative.   Neurological: Positive for tremors. Negative for dizziness, seizures, syncope, facial asymmetry, speech difficulty, weakness, light-headedness, numbness and headaches.  Psychiatric/Behavioral: Negative.     Per HPI unless specifically indicated above     Objective:    BP 118/80   Pulse 71   Temp 97.9 F (36.6 C)   Wt 220 lb 12.8 oz (100.2 kg)   SpO2 100%   BMI 33.57 kg/m   Wt Readings from Last 3 Encounters:  01/06/21 220 lb 12.8 oz (100.2 kg)  12/07/20 220 lb 3.2 oz (99.9 kg)  12/03/20 215 lb (97.5 kg)    Physical Exam Vitals and nursing note reviewed.  Constitutional:      General: She is not in acute distress.    Appearance: Normal appearance. She is not  ill-appearing, toxic-appearing or diaphoretic.  HENT:     Head: Normocephalic and atraumatic.     Right Ear: External ear normal.     Left Ear: External ear normal.     Nose: Nose normal.     Mouth/Throat:     Mouth: Mucous membranes are moist.     Pharynx: Oropharynx is clear.  Eyes:     General: No scleral icterus.       Right eye: No discharge.        Left eye: No discharge.     Extraocular Movements: Extraocular movements intact.     Conjunctiva/sclera: Conjunctivae normal.     Pupils: Pupils are equal, round, and reactive to light.  Cardiovascular:     Rate and Rhythm: Normal rate and regular rhythm.     Pulses: Normal pulses.     Heart sounds: Normal heart sounds. No murmur heard. No friction rub. No gallop.   Pulmonary:     Effort: Pulmonary effort is normal. No respiratory distress.     Breath sounds: Normal breath sounds. No stridor. No wheezing, rhonchi or rales.  Chest:     Chest wall: No tenderness.  Musculoskeletal:        General: Normal range of motion.     Cervical back: Normal range of motion and neck supple.  Skin:    General: Skin is warm and dry.     Capillary Refill: Capillary refill takes less than 2 seconds.  Coloration: Skin is not jaundiced or pale.     Findings: No bruising, erythema, lesion or rash.  Neurological:     General: No focal deficit present.     Mental Status: She is alert and oriented to person, place, and time. Mental status is at baseline.  Psychiatric:        Mood and Affect: Mood normal.        Behavior: Behavior normal.        Thought Content: Thought content normal.        Judgment: Judgment normal.     Results for orders placed or performed in visit on 04/08/20  Microalbumin, Urine Waived  Result Value Ref Range   Microalb, Ur Waived 80 (H) 0 - 19 mg/L   Creatinine, Urine Waived 300 10 - 300 mg/dL   Microalb/Creat Ratio <30 <30 mg/g  CBC with Differential/Platelet  Result Value Ref Range   WBC 5.1 3.4 - 10.8  x10E3/uL   RBC 5.09 3.77 - 5.28 x10E6/uL   Hemoglobin 15.1 11.1 - 15.9 g/dL   Hematocrit 44.5 34.0 - 46.6 %   MCV 87 79 - 97 fL   MCH 29.7 26.6 - 33.0 pg   MCHC 33.9 31.5 - 35.7 g/dL   RDW 12.9 11.7 - 15.4 %   Platelets 223 150 - 450 x10E3/uL   Neutrophils 72 Not Estab. %   Lymphs 15 Not Estab. %   Monocytes 9 Not Estab. %   Eos 3 Not Estab. %   Basos 1 Not Estab. %   Neutrophils Absolute 3.6 1.4 - 7.0 x10E3/uL   Lymphocytes Absolute 0.8 0.7 - 3.1 x10E3/uL   Monocytes Absolute 0.5 0.1 - 0.9 x10E3/uL   EOS (ABSOLUTE) 0.2 0.0 - 0.4 x10E3/uL   Basophils Absolute 0.0 0.0 - 0.2 x10E3/uL   Immature Granulocytes 0 Not Estab. %   Immature Grans (Abs) 0.0 0.0 - 0.1 x10E3/uL  Comprehensive metabolic panel  Result Value Ref Range   Glucose 83 65 - 99 mg/dL   BUN 25 8 - 27 mg/dL   Creatinine, Ser 0.80 0.57 - 1.00 mg/dL   GFR calc non Af Amer 76 >59 mL/min/1.73   GFR calc Af Amer 88 >59 mL/min/1.73   BUN/Creatinine Ratio 31 (H) 12 - 28   Sodium 139 134 - 144 mmol/L   Potassium 4.2 3.5 - 5.2 mmol/L   Chloride 106 96 - 106 mmol/L   CO2 21 20 - 29 mmol/L   Calcium 9.0 8.7 - 10.3 mg/dL   Total Protein 6.2 6.0 - 8.5 g/dL   Albumin 4.2 3.8 - 4.8 g/dL   Globulin, Total 2.0 1.5 - 4.5 g/dL   Albumin/Globulin Ratio 2.1 1.2 - 2.2   Bilirubin Total 0.5 0.0 - 1.2 mg/dL   Alkaline Phosphatase 103 48 - 121 IU/L   AST 19 0 - 40 IU/L   ALT 12 0 - 32 IU/L  Lipid Panel w/o Chol/HDL Ratio  Result Value Ref Range   Cholesterol, Total 172 100 - 199 mg/dL   Triglycerides 63 0 - 149 mg/dL   HDL 76 >39 mg/dL   VLDL Cholesterol Cal 12 5 - 40 mg/dL   LDL Chol Calc (NIH) 84 0 - 99 mg/dL  TSH  Result Value Ref Range   TSH 4.060 0.450 - 4.500 uIU/mL  Urinalysis, Routine w reflex microscopic  Result Value Ref Range   Specific Gravity, UA >1.030 (H) 1.005 - 1.030   pH, UA 5.0 5.0 - 7.5   Color, UA  Yellow Yellow   Appearance Ur Clear Clear   Leukocytes,UA Negative Negative   Protein,UA Negative  Negative/Trace   Glucose, UA Negative Negative   Ketones, UA Negative Negative   RBC, UA Negative Negative   Bilirubin, UA Negative Negative   Urobilinogen, Ur 0.2 0.2 - 1.0 mg/dL   Nitrite, UA Negative Negative  Iron and TIBC  Result Value Ref Range   Total Iron Binding Capacity 269 250 - 450 ug/dL   UIBC 180 118 - 369 ug/dL   Iron 89 27 - 139 ug/dL   Iron Saturation 33 15 - 55 %  Ferritin  Result Value Ref Range   Ferritin 74 15 - 150 ng/mL      Assessment & Plan:   Problem List Items Addressed This Visit      Genitourinary   Benign hypertensive renal disease - Primary    Under good control on current regimen. Continue current regimen. Continue to monitor. Call with any concerns. Refills given. Labs drawn today.        Relevant Orders   Basic metabolic panel    Other Visit Diagnoses    Tremor       Not particularly bothered by it right now. Offered referral to neurology. she would like to hold for now. Call with any concerns or if she changes her mind.    Trochanteric bursitis of right hip       Continues with some issues. Would like to return for injection after 3 months.        Follow up plan: Return 2-3 weeks for trochanteric bursa inj (20 min), Before 5/16 for 3 month follow up.

## 2021-01-06 NOTE — Assessment & Plan Note (Signed)
Under good control on current regimen. Continue current regimen. Continue to monitor. Call with any concerns. Refills given. Labs drawn today.   

## 2021-01-06 NOTE — Patient Instructions (Signed)

## 2021-01-07 LAB — BASIC METABOLIC PANEL
BUN/Creatinine Ratio: 32 — ABNORMAL HIGH (ref 12–28)
BUN: 29 mg/dL — ABNORMAL HIGH (ref 8–27)
CO2: 22 mmol/L (ref 20–29)
Calcium: 8.7 mg/dL (ref 8.7–10.3)
Chloride: 107 mmol/L — ABNORMAL HIGH (ref 96–106)
Creatinine, Ser: 0.91 mg/dL (ref 0.57–1.00)
Glucose: 72 mg/dL (ref 65–99)
Potassium: 4.3 mmol/L (ref 3.5–5.2)
Sodium: 145 mmol/L — ABNORMAL HIGH (ref 134–144)
eGFR: 68 mL/min/{1.73_m2} (ref >59)

## 2021-01-19 DIAGNOSIS — I700 Atherosclerosis of aorta: Secondary | ICD-10-CM | POA: Insufficient documentation

## 2021-01-31 ENCOUNTER — Ambulatory Visit (INDEPENDENT_AMBULATORY_CARE_PROVIDER_SITE_OTHER): Payer: PPO | Admitting: Family Medicine

## 2021-01-31 ENCOUNTER — Other Ambulatory Visit: Payer: Self-pay

## 2021-01-31 ENCOUNTER — Encounter: Payer: Self-pay | Admitting: Family Medicine

## 2021-01-31 VITALS — BP 133/80 | HR 69 | Temp 98.4°F | Wt 221.4 lb

## 2021-01-31 DIAGNOSIS — M7061 Trochanteric bursitis, right hip: Secondary | ICD-10-CM | POA: Diagnosis not present

## 2021-01-31 DIAGNOSIS — Z1211 Encounter for screening for malignant neoplasm of colon: Secondary | ICD-10-CM
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
## 2021-02-16 ENCOUNTER — Encounter: Payer: Self-pay | Admitting: *Deleted

## 2021-02-18 ENCOUNTER — Telehealth (INDEPENDENT_AMBULATORY_CARE_PROVIDER_SITE_OTHER): Payer: Self-pay | Admitting: Gastroenterology

## 2021-02-18 ENCOUNTER — Other Ambulatory Visit: Payer: Self-pay

## 2021-02-18 DIAGNOSIS — Z86010 Personal history of colonic polyps: Secondary | ICD-10-CM

## 2021-02-18 MED ORDER — NA SULFATE-K SULFATE-MG SULF 17.5-3.13-1.6 GM/177ML PO SOLN: 1.0000 | Freq: Once | ORAL | 0 refills | Status: AC

## 2021-02-18 NOTE — Progress Notes (Signed)
Gastroenterology Pre-Procedure Review  Request Date: 05/09/21 Requesting Physician: Dr. Vicente Males  PATIENT REVIEW QUESTIONS: The patient responded to the following health history questions as indicated:    1. Are you having any GI issues? no 2. Do you have a personal history of Polyps? yes (03/2017 colonoscopy performed by Dr. Vicente Males) 3. Do you have a family history of Colon Cancer or Polyps? no 4. Diabetes Mellitus? no 5. Joint replacements in the past 12 months?no 6. Major health problems in the past 3 months?no 7. Any artificial heart valves, MVP, or defibrillator?no    MEDICATIONS & ALLERGIES:    Patient reports the following regarding taking any anticoagulation/antiplatelet therapy:   Plavix, Coumadin, Eliquis, Xarelto, Lovenox, Pradaxa, Brilinta, or Effient? no Aspirin? no  Patient confirms/reports the following medications:  Current Outpatient Medications  Medication Sig Dispense Refill  . albuterol (VENTOLIN HFA) 108 (90 Base) MCG/ACT inhaler Inhale 2 puffs into the lungs every 6 (six) hours as needed for wheezing or shortness of breath. 6.7 g 1  . cetirizine (ZYRTEC) 10 MG tablet Take 10 mg by mouth daily.    . diclofenac Sodium (VOLTAREN) 1 % GEL Apply 4 g topically 4 (four) times daily. 100 g 3  . famotidine (PEPCID) 20 MG tablet Take 1 tablet (20 mg total) by mouth 2 (two) times daily. 180 tablet 1  . ferrous sulfate 325 (65 FE) MG EC tablet Take 1 tablet (325 mg total) by mouth daily. 90 tablet 3  . FLUoxetine (PROZAC) 20 MG capsule Take 3 capsules (60 mg total) by mouth daily. 270 capsule 1  . fluticasone (FLONASE) 50 MCG/ACT nasal spray Place 2 sprays into both nostrils daily.     . hydrocortisone 2.5 % ointment Apply topically.    . hydroxychloroquine (PLAQUENIL) 200 MG tablet Take 200 mg by mouth 2 (two) times daily.    [name] Kitchen lisdexamfetamine (VYVANSE) 60 MG capsule Take 1 capsule (60 mg total) by mouth every morning. 30 capsule 0  . lisinopril (ZESTRIL) 10 MG tablet Take 1  tablet (10 mg total) by mouth daily. 90 tablet 1  . naproxen (NAPROXEN DR) 500 MG EC tablet Take 1 tablet (500 mg total) by mouth 2 (two) times daily with a meal. 180 tablet 3  . omeprazole (PRILOSEC) 20 MG capsule Take 20 mg by mouth daily.    [name] Kitchen lisdexamfetamine (VYVANSE) 60 MG capsule Take 1 capsule (60 mg total) by mouth every morning. 30 capsule 0  . lisdexamfetamine (VYVANSE) 60 MG capsule Take 1 capsule (60 mg total) by mouth every morning. 30 capsule 0   No current facility-administered medications for this visit.    Patient confirms/reports the following allergies:  No Known Allergies  No orders of the defined types were placed in this encounter.   AUTHORIZATION INFORMATION Primary Insurance: 1D#: Group #:  Secondary Insurance: 1D#: Group #:  SCHEDULE INFORMATION: Date: 05/09/21 Time: Location:[facility]

## 2021-02-21 ENCOUNTER — Telehealth: Payer: PPO

## 2021-03-03 ENCOUNTER — Encounter: Payer: Self-pay | Admitting: Family Medicine

## 2021-03-03 ENCOUNTER — Other Ambulatory Visit: Payer: Self-pay

## 2021-03-03 ENCOUNTER — Ambulatory Visit (INDEPENDENT_AMBULATORY_CARE_PROVIDER_SITE_OTHER): Payer: PPO | Admitting: Family Medicine

## 2021-03-03 VITALS — BP 126/79 | HR 71 | Temp 98.2°F | Wt 206.4 lb

## 2021-03-03 DIAGNOSIS — F339 Major depressive disorder, recurrent, unspecified: Secondary | ICD-10-CM | POA: Diagnosis not present

## 2021-03-03 DIAGNOSIS — I700 Atherosclerosis of aorta: Secondary | ICD-10-CM

## 2021-03-03 DIAGNOSIS — F5081 Binge eating disorder: Secondary | ICD-10-CM

## 2021-03-03 MED ORDER — FLUOXETINE HCL 20 MG PO CAPS: 60.0000 mg | ORAL_CAPSULE | Freq: Every day | ORAL | 1 refills | Status: DC

## 2021-03-03 MED ORDER — ALBUTEROL SULFATE HFA 108 (90 BASE) MCG/ACT IN AERS
2.0000 | INHALATION_SPRAY | Freq: Four times a day (QID) | RESPIRATORY_TRACT | 1 refills | Status: DC | PRN
Start: 2021-03-03 — End: 2021-03-08

## 2021-03-03 MED ORDER — LISDEXAMFETAMINE DIMESYLATE 60 MG PO CAPS: 60.0000 mg | ORAL_CAPSULE | ORAL | 0 refills | Status: DC

## 2021-03-03 MED ORDER — FAMOTIDINE 20 MG PO TABS: 20.0000 mg | ORAL_TABLET | Freq: Two times a day (BID) | ORAL | 1 refills | Status: DC

## 2021-03-03 NOTE — Assessment & Plan Note (Signed)
Under good control on current regimen. Continue current regimen. Continue to monitor. Call with any concerns. Refills given.   

## 2021-03-03 NOTE — Assessment & Plan Note (Signed)
Will keep BP and cholesterol under good control. Continue to monitor. Call with any concerns.  

## 2021-03-03 NOTE — Progress Notes (Signed)
BP 126/79   Pulse 71   Temp 98.2 F (36.8 C)   Wt 206 lb 6.4 oz (93.6 kg)   SpO2 98%   BMI 31.38 kg/m    Subjective:    Patient ID: Mckenzie Webster, female    DOB: April 24, 1951, 70 y.o.   MRN: [id number]  HPI: Mckenzie Webster is a 70 y.o. female  Chief Complaint  Patient presents with  . Depression  . binge eating disorder   Has been doing optivia diet plan and has been doing a lot more exercise. She wasn't feeling well with that. She was really tired when she was doing that diet and exercising. She is feeling better now. Now she is eating healthily. Has been able to avoid binge eating. She notes that she is very happy with her weight loss and has been feeling well. Tolerating her medicine well.   DEPRESSION Mood status: controlled Satisfied with current treatment?: yes Symptom severity: mild  Duration of current treatment : chronic Side effects: no Medication compliance: excellent compliance Psychotherapy/counseling: no  Previous psychiatric medications: fluoxetine Depressed mood: no Anxious mood: no Anhedonia: no Significant weight loss or gain: no Insomnia: no  Fatigue: no Feelings of worthlessness or guilt: no Impaired concentration/indecisiveness: no Suicidal ideations: no Hopelessness: no Crying spells: no Depression screen [facility] 2/9 03/03/2021 12/07/2020 09/14/2020 05/21/2020 04/08/2020  Decreased Interest 0 0 0 1 1  Down, Depressed, Hopeless 0 0 0 1 1  PHQ - 2 Score 0 0 0 2 2  Altered sleeping 0 0 0 2 1  Tired, decreased energy 0 0 1 1 3   Change in appetite 0 1 0 2 2  Feeling bad or failure about yourself  0 0 0 2 1  Trouble concentrating 0 0 0 0 1  Moving slowly or fidgety/restless 0 0 0 0 1  Suicidal thoughts 0 0 0 0 0  PHQ-9 Score 0 1 1 9 11   Difficult doing work/chores - Not difficult at all - Somewhat difficult Very difficult  Some recent data might be hidden    Relevant past medical, surgical, family and social history reviewed and updated as indicated.  Interim medical history since our last visit reviewed. Allergies and medications reviewed and updated.  Review of Systems  Constitutional: Negative.   Respiratory: Negative.   Cardiovascular: Negative.   Gastrointestinal: Negative.   Musculoskeletal: Negative.   Psychiatric/Behavioral: Negative.     Per HPI unless specifically indicated above     Objective:    BP 126/79   Pulse 71   Temp 98.2 F (36.8 C)   Wt 206 lb 6.4 oz (93.6 kg)   SpO2 98%   BMI 31.38 kg/m   Wt Readings from Last 3 Encounters:  03/03/21 206 lb 6.4 oz (93.6 kg)  01/31/21 221 lb 6.4 oz (100.4 kg)  01/06/21 220 lb 12.8 oz (100.2 kg)    Physical Exam Vitals and nursing note reviewed.  Constitutional:      General: She is not in acute distress.    Appearance: Normal appearance. She is not ill-appearing, toxic-appearing or diaphoretic.  HENT:     Head: Normocephalic and atraumatic.     Right Ear: External ear normal.     Left Ear: External ear normal.     Nose: Nose normal.     Mouth/Throat:     Mouth: Mucous membranes are moist.     Pharynx: Oropharynx is clear.  Eyes:     General: No scleral icterus.  Right eye: No discharge.        Left eye: No discharge.     Extraocular Movements: Extraocular movements intact.     Conjunctiva/sclera: Conjunctivae normal.     Pupils: Pupils are equal, round, and reactive to light.  Cardiovascular:     Rate and Rhythm: Normal rate and regular rhythm.     Pulses: Normal pulses.     Heart sounds: Normal heart sounds. No murmur heard. No friction rub. No gallop.   Pulmonary:     Effort: Pulmonary effort is normal. No respiratory distress.     Breath sounds: Normal breath sounds. No stridor. No wheezing, rhonchi or rales.  Chest:     Chest wall: No tenderness.  Musculoskeletal:        General: Normal range of motion.     Cervical back: Normal range of motion and neck supple.  Skin:    General: Skin is warm and dry.     Capillary Refill: Capillary  refill takes less than 2 seconds.     Coloration: Skin is not jaundiced or pale.     Findings: No bruising, erythema, lesion or rash.  Neurological:     General: No focal deficit present.     Mental Status: She is alert and oriented to person, place, and time. Mental status is at baseline.  Psychiatric:        Mood and Affect: Mood normal.        Behavior: Behavior normal.        Thought Content: Thought content normal.        Judgment: Judgment normal.     Results for orders placed or performed in visit on 29/56/21  Basic metabolic panel  Result Value Ref Range   Glucose 72 65 - 99 mg/dL   BUN 29 (H) 8 - 27 mg/dL   Creatinine, Ser 0.91 0.57 - 1.00 mg/dL   eGFR 68 >59 mL/min/1.73   BUN/Creatinine Ratio 32 (H) 12 - 28   Sodium 145 (H) 134 - 144 mmol/L   Potassium 4.3 3.5 - 5.2 mmol/L   Chloride 107 (H) 96 - 106 mmol/L   CO2 22 20 - 29 mmol/L   Calcium 8.7 8.7 - 10.3 mg/dL      Assessment & Plan:   Problem List Items Addressed This Visit      Cardiovascular and Mediastinum   Aortic atherosclerosis (HCC)    Will keep BP and cholesterol under good control. Continue to monitor. Call with any concerns.         Other   Binge eating disorder - Primary    Under good control on current regimen. Continue current regimen. Continue to monitor. Call with any concerns. Refills given for 3 months. Follow up 3 months.        Depression, recurrent ([place])    Under good control on current regimen. Continue current regimen. Continue to monitor. Call with any concerns. Refills given.        Relevant Medications   FLUoxetine (PROZAC) 20 MG capsule       Follow up plan: Return in about 3 months (around 06/03/2021).

## 2021-03-03 NOTE — Assessment & Plan Note (Signed)
Under good control on current regimen. Continue current regimen. Continue to monitor. Call with any concerns. Refills given for 3 months. Follow up 3 months.    

## 2021-03-08 ENCOUNTER — Telehealth: Payer: Self-pay

## 2021-03-08 MED ORDER — ALBUTEROL SULFATE HFA 108 (90 BASE) MCG/ACT IN AERS: 2.0000 | INHALATION_SPRAY | Freq: Four times a day (QID) | RESPIRATORY_TRACT | 1 refills | Status: DC | PRN

## 2021-03-08 NOTE — Telephone Encounter (Signed)
Copied from [place] [id number]. Topic: General - Other >> Mar 08, 2021  9:00 AM Leward Quan A wrote: Reason for CRM: Sarah with [facility]) - [place], [place] [address] [place]  Phone: [phone]    Fax: [phone] called in to ask if they can dispence a 90 day supply medication  for the patient for albuterol (VENTOLIN HFA) 108 (90 Base) MCG/ACT inhaler. Would like a new Rx sent over please  direct number for [facility] Ph# [phone]

## 2021-04-13 ENCOUNTER — Other Ambulatory Visit: Payer: Self-pay

## 2021-04-13 MED ORDER — NAPROXEN 500 MG PO TBEC: 500.0000 mg | DELAYED_RELEASE_TABLET | Freq: Two times a day (BID) | ORAL | 3 refills | Status: DC

## 2021-04-13 NOTE — Telephone Encounter (Signed)
Refill request for this medication is approved by provider.

## 2021-04-15 ENCOUNTER — Other Ambulatory Visit: Payer: Self-pay | Admitting: Family Medicine

## 2021-04-15 DIAGNOSIS — Z1231 Encounter for screening mammogram for malignant neoplasm of breast: Secondary | ICD-10-CM

## 2021-05-09 ENCOUNTER — Ambulatory Visit: Payer: PPO | Admitting: Anesthesiology

## 2021-05-09 ENCOUNTER — Encounter
Admission: RE | Disposition: A | Discharge status: Home / Self Care | Payer: Self-pay | Source: Home / Self Care | Attending: Gastroenterology

## 2021-05-09 ENCOUNTER — Ambulatory Visit
Admission: RE | Admit: 2021-05-09 | Discharge: 2021-05-09 | Disposition: A | Discharge status: Home / Self Care | Payer: PPO | Attending: Gastroenterology | Admitting: Gastroenterology

## 2021-05-09 DIAGNOSIS — Z791 Long term (current) use of non-steroidal anti-inflammatories (NSAID): Secondary | ICD-10-CM | POA: Diagnosis not present

## 2021-05-09 DIAGNOSIS — Z9884 Bariatric surgery status: Secondary | ICD-10-CM | POA: Insufficient documentation

## 2021-05-09 DIAGNOSIS — Z8711 Personal history of peptic ulcer disease: Secondary | ICD-10-CM | POA: Diagnosis not present

## 2021-05-09 DIAGNOSIS — Z86010 Personal history of colonic polyps: Secondary | ICD-10-CM | POA: Diagnosis not present

## 2021-05-09 DIAGNOSIS — Z87891 Personal history of nicotine dependence: Secondary | ICD-10-CM | POA: Insufficient documentation

## 2021-05-09 DIAGNOSIS — Z1211 Encounter for screening for malignant neoplasm of colon: Secondary | ICD-10-CM | POA: Diagnosis not present

## 2021-05-09 DIAGNOSIS — Z79899 Other long term (current) drug therapy: Secondary | ICD-10-CM | POA: Diagnosis not present

## 2021-05-09 HISTORY — PX: COLONOSCOPY WITH PROPOFOL: SHX5780

## 2021-05-09 SURGERY — COLONOSCOPY WITH PROPOFOL
Anesthesia: General

## 2021-05-09 MED ORDER — PROPOFOL 500 MG/50ML IV EMUL
INTRAVENOUS | Status: AC
  Filled 2021-05-09: qty 100

## 2021-05-09 MED ORDER — LIDOCAINE HCL (CARDIAC) PF 100 MG/5ML IV SOSY
PREFILLED_SYRINGE | INTRAVENOUS | Status: DC | PRN
  Administered 2021-05-09: 25 mg via INTRAVENOUS

## 2021-05-09 MED ORDER — EPHEDRINE 5 MG/ML INJ
INTRAVENOUS | Status: AC
  Filled 2021-05-09: qty 10

## 2021-05-09 MED ORDER — SODIUM CHLORIDE 0.9 % IV SOLN
INTRAVENOUS | Status: DC
  Administered 2021-05-09: 20 mL/h via INTRAVENOUS

## 2021-05-09 MED ORDER — PHENYLEPHRINE HCL (PRESSORS) 10 MG/ML IV SOLN
INTRAVENOUS | Status: AC
  Filled 2021-05-09: qty 1

## 2021-05-09 MED ORDER — PROPOFOL 500 MG/50ML IV EMUL
INTRAVENOUS | Status: DC | PRN
  Administered 2021-05-09: 150 ug/kg/min via INTRAVENOUS

## 2021-05-09 MED ORDER — PROPOFOL 10 MG/ML IV BOLUS
INTRAVENOUS | Status: AC
  Filled 2021-05-09: qty 80

## 2021-05-09 MED ORDER — PROPOFOL 10 MG/ML IV BOLUS
INTRAVENOUS | Status: DC | PRN
  Administered 2021-05-09: 40 mg via INTRAVENOUS
  Administered 2021-05-09: 70 mg via INTRAVENOUS
  Administered 2021-05-09: 40 mg via INTRAVENOUS

## 2021-05-09 NOTE — H&P (Signed)
Jonathon Bellows, MD [address], [place], [place], [place], [address] [address], [place], [place], [place], [address] Phone: [phone]  Fax: [phone]  Primary Care Physician:  Valerie Roys, DO   Pre-Procedure History & Physical: HPI:  Mckenzie Webster is a 70 y.o. female is here for an colonoscopy.   Past Medical History:  Diagnosis Date   Anemia    Cataract    Dry eye syndrome    Positive colorectal cancer screening using Cologuard test 02/14/2017   Stomach ulcer     Past Surgical History:  Procedure Laterality Date   COLONOSCOPY WITH PROPOFOL N/A 04/27/2017   Procedure: COLONOSCOPY WITH PROPOFOL;  Surgeon: Jonathon Bellows, MD;  Location: [facility] ENDOSCOPY;  Service: Endoscopy;  Laterality: N/A;   EYE SURGERY     Cataract   FOOT SURGERY     Change the alignment of the toes to stop the formation of corns   gastric bypass  1981    Prior to Admission medications   Medication Sig Start Date End Date Taking? Authorizing Provider  albuterol (VENTOLIN HFA) 108 (90 Base) MCG/ACT inhaler Inhale 2 puffs into the lungs every 6 (six) hours as needed for wheezing or shortness of breath. 03/08/21  Yes Johnson, Megan P, DO  cetirizine (ZYRTEC) 10 MG tablet Take 10 mg by mouth daily.   Yes [provider]  diclofenac Sodium (VOLTAREN) 1 % GEL Apply 4 g topically 4 (four) times daily. 10/05/20  Yes Johnson, Megan P, DO  famotidine (PEPCID) 20 MG tablet Take 1 tablet (20 mg total) by mouth 2 (two) times daily. 03/03/21  Yes Johnson, Megan P, DO  ferrous sulfate 325 (65 FE) MG EC tablet Take 1 tablet (325 mg total) by mouth daily. 11/28/17  Yes Johnson, Megan P, DO  FLUoxetine (PROZAC) 20 MG capsule Take 3 capsules (60 mg total) by mouth daily. 03/03/21  Yes Johnson, Megan P, DO  fluticasone (FLONASE) 50 MCG/ACT nasal spray Place 2 sprays into both nostrils daily.    Yes [provider]  hydrocortisone 2.5 % ointment Apply topically. 12/06/20 12/06/21 Yes [provider]   hydroxychloroquine (PLAQUENIL) 200 MG tablet Take 200 mg by mouth 2 (two) times daily. 12/03/20  Yes [provider]  lisdexamfetamine (VYVANSE) 60 MG capsule Take 1 capsule (60 mg total) by mouth every morning. 04/12/21 05/12/21 Yes Johnson, Megan P, DO  lisdexamfetamine (VYVANSE) 60 MG capsule Take 1 capsule (60 mg total) by mouth every morning. 05/12/21 06/11/21 Yes Johnson, Megan P, DO  lisinopril (ZESTRIL) 10 MG tablet Take 1 tablet (10 mg total) by mouth daily. 01/06/21  Yes Johnson, Megan P, DO  naproxen (NAPROXEN DR) 500 MG EC tablet Take 1 tablet (500 mg total) by mouth 2 (two) times daily with a meal. 04/13/21  Yes Cannady, Jolene T, NP  omeprazole (PRILOSEC) 20 MG capsule Take 20 mg by mouth daily. 11/11/20  Yes [provider]  lisdexamfetamine (VYVANSE) 60 MG capsule Take 1 capsule (60 mg total) by mouth every morning. 03/13/21 04/12/21  Park Liter P, DO    Allergies as of 02/18/2021   (No Known Allergies)    Family History  Problem Relation Age of Onset   Heart disease Mother    COPD Mother    Heart disease Father    Hypertension Father    Heart disease Maternal Grandfather    Alzheimer's disease Paternal Grandmother    Breast cancer Neg Hx     Social History   Socioeconomic History  Marital status: Divorced    Spouse name: Not on file   Number of children: Not on file   Years of education: Not on file   Highest education level: Some college, no degree  Occupational History   Occupation: Tax inspector part time   Tobacco Use   Smoking status: Former    Pack years: 0.00    Types: Cigarettes    Quit date: 10/30/1989    Years since quitting: 31.5   Smokeless tobacco: Never  Vaping Use   Vaping Use: Never used  Substance and Sexual Activity   Alcohol use: Yes    Comment: on occasion   Drug use: No   Sexual activity: Not Currently  Other Topics Concern   Not on file  Social History Narrative   Not on file   Social Determinants of Health    Financial Resource Strain: Not on file  Food Insecurity: Not on file  Transportation Needs: Not on file  Physical Activity: Not on file  Stress: Not on file  Social Connections: Not on file  Intimate Partner Violence: Not on file    Review of Systems: See HPI, otherwise negative ROS  Physical Exam: BP 134/79   Pulse 67   Temp (!) 97.2 F (36.2 C) (Temporal)   Resp 20   Ht 5' 9.5" (1.765 m)   Wt 97.5 kg   SpO2 100%   BMI 31.29 kg/m  General:   Alert,  pleasant and cooperative in NAD Head:  Normocephalic and atraumatic. Neck:  Supple; no masses or thyromegaly. Lungs:  Clear throughout to auscultation, normal respiratory effort.    Heart:  +S1, +S2, Regular rate and rhythm, No edema. Abdomen:  Soft, nontender and nondistended. Normal bowel sounds, without guarding, and without rebound.   Neurologic:  Alert and  oriented x4;  grossly normal neurologically.  Impression/Plan: Mckenzie Webster is here for an colonoscopy to be performed for surveillance due to prior history of colon polyps   Risks, benefits, limitations, and alternatives regarding  colonoscopy have been reviewed with the patient.  Questions have been answered.  All parties agreeable.   Jonathon Bellows, MD  05/09/2021, 7:40 AM

## 2021-05-09 NOTE — Transfer of Care (Signed)
Immediate Anesthesia Transfer of Care Note  Patient: DEONI COSEY  Procedure(s) Performed: COLONOSCOPY WITH PROPOFOL  Patient Location: PACU and Endoscopy Unit  Anesthesia Type:General  Level of Consciousness: sedated, drowsy and patient cooperative  Airway & Oxygen Therapy: Patient Spontanous Breathing  Post-op Assessment: Report given to RN and Post -op Vital signs reviewed and stable  Post vital signs: Reviewed and stable  Last Vitals:  Vitals Value Taken Time  BP 109/67 05/09/21 0801  Temp 35.9 C 05/09/21 0801  Pulse 63 05/09/21 0801  Resp 17 05/09/21 0801  SpO2 100 % 05/09/21 0801    Last Pain:  Vitals:   05/09/21 0801  TempSrc: Tympanic  PainSc: Asleep         Complications: No notable events documented.

## 2021-05-09 NOTE — Anesthesia Postprocedure Evaluation (Signed)
Anesthesia Post Note  Patient: Mckenzie Webster  Procedure(s) Performed: COLONOSCOPY WITH PROPOFOL  Patient location during evaluation: Endoscopy Anesthesia Type: General Level of consciousness: awake and alert and oriented Pain management: pain level controlled Vital Signs Assessment: post-procedure vital signs reviewed and stable Respiratory status: spontaneous breathing, nonlabored ventilation and respiratory function stable Cardiovascular status: blood pressure returned to baseline and stable Postop Assessment: no signs of nausea or vomiting Anesthetic complications: no   No notable events documented.   Last Vitals:  Vitals:   05/09/21 0821 05/09/21 0831  BP: 132/83 138/74  Pulse: 68 65  Resp: 14 18  Temp:    SpO2: 100% 100%    Last Pain:  Vitals:   05/09/21 0831  TempSrc:   PainSc: 0-No pain                 Alnisa Hasley

## 2021-05-09 NOTE — Op Note (Addendum)
[facility] Gastroenterology Patient Name: Mckenzie Webster Procedure Date: 05/09/2021 7:15 AM MRN: [id number] Account #: [id number] Date of Birth: 1951/03/15 Admit Type: Outpatient Age: 70 Room: [facility] ENDO ROOM 1 Gender: Female Note Status: Finalized Procedure:             Colonoscopy Indications:           Surveillance: Personal history of adenomatous polyps                         on last colonoscopy > 3 years ago, Last colonoscopy:                         June 2018 Providers:             Jonathon Bellows MD, MD Referring MD:          Valerie Roys (Referring MD) Medicines:             Monitored Anesthesia Care Complications:         No immediate complications. Procedure:             Pre-Anesthesia Assessment:                        - Prior to the procedure, a History and Physical was                         performed, and patient medications, allergies and                         sensitivities were reviewed. The patient's tolerance                         of previous anesthesia was reviewed.                        - The risks and benefits of the procedure and the                         sedation options and risks were discussed with the                         patient. All questions were answered and informed                         consent was obtained.                        - ASA Grade Assessment: II - A patient with mild                         systemic disease.                        After obtaining informed consent, the colonoscope was                         passed under direct vision. Throughout the procedure,                         the patient's blood pressure, pulse, and oxygen  saturations were monitored continuously. The                         Colonoscope was introduced through the anus and                         advanced to the the cecum, identified by the                         appendiceal orifice. The colonoscopy was  performed                         with ease. The patient tolerated the procedure well.                         The quality of the bowel preparation was excellent. Findings:      The entire examined colon appeared normal on direct and retroflexion       views. Impression:            - The entire examined colon is normal on direct and                         retroflexion views.                        - No specimens collected. Recommendation:        - Discharge patient to home (with escort).                        - Resume previous diet.                        - Continue present medications.                        - Repeat colonoscopy is not recommended due to current                         age (61 years or older) for screening purposes. Procedure Code(s):     --- Professional ---                        [phone], Colonoscopy, flexible; diagnostic, including                         collection of specimen(s) by brushing or washing, when                         performed (separate procedure) Diagnosis Code(s):     --- Professional ---                        Z86.010, Personal history of colonic polyps CPT copyright 2019 American Medical Association. All rights reserved. The codes documented in this report are preliminary and upon coder review may  be revised to meet current compliance requirements. Jonathon Bellows, MD Jonathon Bellows MD, MD 05/09/2021 7:59:41 AM This report has been signed electronically. Number of Addenda: 0 Note Initiated On: 05/09/2021 7:15 AM Scope Withdrawal Time: 0 hours 8 minutes 48 seconds  Total Procedure Duration: 0 hours 13  minutes 7 seconds  Estimated Blood Loss:  Estimated blood loss: none.      [facility]

## 2021-05-09 NOTE — Anesthesia Preprocedure Evaluation (Signed)
Anesthesia Evaluation  Patient identified by MRN, date of birth, ID band Patient awake    Reviewed: Allergy & Precautions, NPO status , Patient's Chart, lab work & pertinent test results  History of Anesthesia Complications Negative for: history of anesthetic complications  Airway Mallampati: II  TM Distance: >3 FB Neck ROM: Full    Dental no notable dental hx.    Pulmonary neg sleep apnea, neg COPD, former smoker,    breath sounds clear to auscultation- rhonchi (-) wheezing      Cardiovascular Exercise Tolerance: Good (-) hypertension(-) CAD, (-) Past MI, (-) Cardiac Stents and (-) CABG  Rhythm:Regular Rate:Normal - Systolic murmurs and - Diastolic murmurs    Neuro/Psych neg Seizures PSYCHIATRIC DISORDERS Depression negative neurological ROS     GI/Hepatic Neg liver ROS, PUD, GERD  ,  Endo/Other  negative endocrine ROSneg diabetes  Renal/GU negative Renal ROS     Musculoskeletal negative musculoskeletal ROS (+)   Abdominal (+) + obese,   Peds  Hematology  (+) anemia ,   Anesthesia Other Findings Past Medical History: No date: Anemia No date: Cataract No date: Dry eye syndrome 02/14/2017: Positive colorectal cancer screening using Cologuard test No date: Stomach ulcer   Reproductive/Obstetrics                             Anesthesia Physical Anesthesia Plan  ASA: 2  Anesthesia Plan: General   Post-op Pain Management:    Induction: Intravenous  PONV Risk Score and Plan: 2 and Propofol infusion  Airway Management Planned: Natural Airway  Additional Equipment:   Intra-op Plan:   Post-operative Plan:   Informed Consent: I have reviewed the patients History and Physical, chart, labs and discussed the procedure including the risks, benefits and alternatives for the proposed anesthesia with the patient or authorized representative who has indicated his/her understanding and  acceptance.     Dental advisory given  Plan Discussed with: CRNA and Anesthesiologist  Anesthesia Plan Comments:         Anesthesia Quick Evaluation

## 2021-05-10 ENCOUNTER — Encounter: Payer: Self-pay | Admitting: Gastroenterology

## 2021-05-17 ENCOUNTER — Other Ambulatory Visit: Payer: Self-pay | Admitting: Family Medicine

## 2021-05-17 ENCOUNTER — Other Ambulatory Visit: Payer: Self-pay

## 2021-05-17 ENCOUNTER — Ambulatory Visit
Admission: RE | Admit: 2021-05-17 | Discharge: 2021-05-17 | Disposition: A | Discharge status: Home / Self Care | Payer: PPO | Source: Ambulatory Visit | Attending: Family Medicine | Admitting: Family Medicine

## 2021-05-17 DIAGNOSIS — Z1231 Encounter for screening mammogram for malignant neoplasm of breast: Secondary | ICD-10-CM | POA: Diagnosis not present

## 2021-05-17 NOTE — Telephone Encounter (Signed)
Requested medication (s) are due for refill today: Yes  Requested medication (s) are on the active medication list: Yes  Last refill:  11/11/20  Future visit scheduled: Yes  Notes to clinic:  Unable to refill per protocol, last refill by another provider.      Requested Prescriptions  Pending Prescriptions Disp Refills   omeprazole (PRILOSEC) 20 MG capsule [Pharmacy Med Name: OMEPRAZOLE DR 20 MG CAP] 30 capsule     Sig: TAKE 1 CAPSULE BY MOUTH ONCE DAILY      Gastroenterology: Proton Pump Inhibitors Passed - 05/17/2021 12:55 PM      Passed - Valid encounter within last 12 months    Recent Outpatient Visits           2 months ago Binge eating disorder   [place], Megan P, DO   3 months ago Trochanteric bursitis of right hip   [place], Megan P, DO   4 months ago Benign hypertensive renal disease   [facility] Johnson, Megan P, DO   5 months ago Hearing loss, unspecified hearing loss type, unspecified laterality   [employer], Megan P, DO   6 months ago Trochanteric bursitis of right hip   [place], [place], DO       Future Appointments             In 3 weeks Wynetta Emery, Barb Merino, DO [employer]

## 2021-05-17 NOTE — Telephone Encounter (Signed)
Pt has apt on 06/10/2021

## 2021-06-10 ENCOUNTER — Other Ambulatory Visit: Payer: Self-pay

## 2021-06-10 ENCOUNTER — Ambulatory Visit (INDEPENDENT_AMBULATORY_CARE_PROVIDER_SITE_OTHER): Payer: PPO | Admitting: Family Medicine

## 2021-06-10 ENCOUNTER — Encounter: Payer: Self-pay | Admitting: Family Medicine

## 2021-06-10 VITALS — BP 116/69 | HR 68 | Temp 98.0°F | Ht 68.7 in | Wt 212.4 lb

## 2021-06-10 DIAGNOSIS — F339 Major depressive disorder, recurrent, unspecified: Secondary | ICD-10-CM

## 2021-06-10 DIAGNOSIS — I129 Hypertensive chronic kidney disease with stage 1 through stage 4 chronic kidney disease, or unspecified chronic kidney disease: Secondary | ICD-10-CM | POA: Diagnosis not present

## 2021-06-10 DIAGNOSIS — I700 Atherosclerosis of aorta: Secondary | ICD-10-CM | POA: Diagnosis not present

## 2021-06-10 DIAGNOSIS — Z1231 Encounter for screening mammogram for malignant neoplasm of breast: Secondary | ICD-10-CM | POA: Diagnosis not present

## 2021-06-10 DIAGNOSIS — L270 Generalized skin eruption due to drugs and medicaments taken internally: Secondary | ICD-10-CM | POA: Diagnosis not present

## 2021-06-10 DIAGNOSIS — Z0000 Encounter for general adult medical examination without abnormal findings: Secondary | ICD-10-CM

## 2021-06-10 DIAGNOSIS — F5081 Binge eating disorder: Secondary | ICD-10-CM | POA: Diagnosis not present

## 2021-06-10 DIAGNOSIS — F50819 Binge eating disorder, unspecified: Secondary | ICD-10-CM

## 2021-06-10 DIAGNOSIS — M810 Age-related osteoporosis without current pathological fracture: Secondary | ICD-10-CM | POA: Diagnosis not present

## 2021-06-10 DIAGNOSIS — D509 Iron deficiency anemia, unspecified: Secondary | ICD-10-CM | POA: Diagnosis not present

## 2021-06-10 MED ORDER — FAMOTIDINE 20 MG PO TABS: 20.0000 mg | ORAL_TABLET | Freq: Two times a day (BID) | ORAL | 1 refills | Status: DC

## 2021-06-10 MED ORDER — LISDEXAMFETAMINE DIMESYLATE 60 MG PO CAPS: 60.0000 mg | ORAL_CAPSULE | ORAL | 0 refills | Status: DC

## 2021-06-10 MED ORDER — LISDEXAMFETAMINE DIMESYLATE 60 MG PO CAPS
60.0000 mg | ORAL_CAPSULE | ORAL | 0 refills | Status: DC
Start: 2021-08-10 — End: 2021-09-12

## 2021-06-10 MED ORDER — FLUOXETINE HCL 20 MG PO CAPS: 60.0000 mg | ORAL_CAPSULE | Freq: Every day | ORAL | 1 refills | Status: DC

## 2021-06-10 MED ORDER — LISINOPRIL 10 MG PO TABS: 10.0000 mg | ORAL_TABLET | Freq: Every day | ORAL | 1 refills | Status: DC

## 2021-06-10 MED ORDER — OMEPRAZOLE 20 MG PO CPDR: 20.0000 mg | DELAYED_RELEASE_CAPSULE | Freq: Every day | ORAL | 3 refills | Status: DC

## 2021-06-10 MED ORDER — FERROUS SULFATE 325 (65 FE) MG PO TBEC: 325.0000 mg | DELAYED_RELEASE_TABLET | Freq: Every day | ORAL | 3 refills | Status: DC

## 2021-06-10 NOTE — Assessment & Plan Note (Signed)
Under good control on current regimen. Continue current regimen. Continue to monitor. Call with any concerns. Refills given for 3 months. Follow up 3 months.    

## 2021-06-10 NOTE — Assessment & Plan Note (Signed)
Rechecking labs today. Await results. Treat as needed.  °

## 2021-06-10 NOTE — Assessment & Plan Note (Signed)
Will keep BP and cholesterol under good control. Continue diet and exercise. Call with any concerns.  °

## 2021-06-10 NOTE — Assessment & Plan Note (Signed)
Due for repeat DEXA. Ordered today. Await results.  

## 2021-06-10 NOTE — Patient Instructions (Signed)
Call to schedule your mammogram and bone density: [facility]  Address: [place], [place], [place] [address]  Phone: [phone]

## 2021-06-10 NOTE — Progress Notes (Signed)
BP 116/69   Pulse 68   Temp 98 F (36.7 C) (Oral)   Ht 5' 8.7" (1.745 m)   Wt 212 lb 6.4 oz (96.3 kg)   SpO2 96%   BMI 31.64 kg/m    Subjective:    Patient ID: Mckenzie Webster, female    DOB: 1951/09/02, 70 y.o.   MRN: [id number]  HPI: Mckenzie Webster is a 70 y.o. female presenting on 06/10/2021 for comprehensive medical examination. Current medical complaints include:  Has been doing well with her vyvanse. Not binging too much- still having trouble at night.   HYPERTENSION Hypertension status: controlled  Satisfied with current treatment? yes Duration of hypertension: chronic BP monitoring frequency:  not checking BP medication side effects:  no Medication compliance: excellent compliance Previous BP meds:lisinopril Aspirin: no Recurrent headaches: no Visual changes: no Palpitations: no Dyspnea: no Chest pain: no Lower extremity edema: no Dizzy/lightheaded: no  DEPRESSION Mood status: controlled Satisfied with current treatment?: yes Symptom severity: mild  Duration of current treatment : chronic Side effects: no Medication compliance: excellent compliance Psychotherapy/counseling: no  Previous psychiatric medications: prozac Depressed mood: no Anxious mood: no Anhedonia: no Significant weight loss or gain: no Insomnia: no  Fatigue: no Feelings of worthlessness or guilt: no Impaired concentration/indecisiveness: no Suicidal ideations: no Hopelessness: no Crying spells: no Depression screen [facility] 2/9 06/10/2021 03/03/2021 12/07/2020 09/14/2020 05/21/2020  Decreased Interest 0 0 0 0 1  Down, Depressed, Hopeless 0 0 0 0 1  PHQ - 2 Score 0 0 0 0 2  Altered sleeping 0 0 0 0 2  Tired, decreased energy 0 0 0 1 1  Change in appetite 0 0 1 0 2  Feeling bad or failure about yourself  0 0 0 0 2  Trouble concentrating 0 0 0 0 0  Moving slowly or fidgety/restless 0 0 0 0 0  Suicidal thoughts 0 0 0 0 0  PHQ-9 Score 0 0 1 1 9   Difficult doing work/chores Not difficult at  all - Not difficult at all - Somewhat difficult  Some recent data might be hidden   She currently lives with: alone  Menopausal Symptoms: no  Depression Screen done today and results listed below:  Depression screen [facility] 2/9 06/10/2021 03/03/2021 12/07/2020 09/14/2020 05/21/2020  Decreased Interest 0 0 0 0 1  Down, Depressed, Hopeless 0 0 0 0 1  PHQ - 2 Score 0 0 0 0 2  Altered sleeping 0 0 0 0 2  Tired, decreased energy 0 0 0 1 1  Change in appetite 0 0 1 0 2  Feeling bad or failure about yourself  0 0 0 0 2  Trouble concentrating 0 0 0 0 0  Moving slowly or fidgety/restless 0 0 0 0 0  Suicidal thoughts 0 0 0 0 0  PHQ-9 Score 0 0 1 1 9   Difficult doing work/chores Not difficult at all - Not difficult at all - Somewhat difficult  Some recent data might be hidden    Past Medical History:  Past Medical History:  Diagnosis Date   Anemia    Cataract    Dry eye syndrome    Positive colorectal cancer screening using Cologuard test 02/14/2017   Stomach ulcer     Surgical History:  Past Surgical History:  Procedure Laterality Date   COLONOSCOPY WITH PROPOFOL N/A 04/27/2017   Procedure: COLONOSCOPY WITH PROPOFOL;  Surgeon: Jonathon Bellows, MD;  Location: [facility] ENDOSCOPY;  Service: Endoscopy;  Laterality: N/A;   COLONOSCOPY  WITH PROPOFOL N/A 05/09/2021   Procedure: COLONOSCOPY WITH PROPOFOL;  Surgeon: Jonathon Bellows, MD;  Location: [facility] ENDOSCOPY;  Service: Gastroenterology;  Laterality: N/A;   EYE SURGERY     Cataract   FOOT SURGERY     Change the alignment of the toes to stop the formation of corns   gastric bypass  1981    Medications:  Current Outpatient Medications on File Prior to Visit  Medication Sig   albuterol (VENTOLIN HFA) 108 (90 Base) MCG/ACT inhaler Inhale 2 puffs into the lungs every 6 (six) hours as needed for wheezing or shortness of breath.   cetirizine (ZYRTEC) 10 MG tablet Take 10 mg by mouth daily.   fluticasone (FLONASE) 50 MCG/ACT nasal spray Place 2 sprays into both  nostrils daily.    hydrocortisone 2.5 % ointment Apply topically.   hydroxychloroquine (PLAQUENIL) 200 MG tablet Take 200 mg by mouth 2 (two) times daily.   naproxen (NAPROXEN DR) 500 MG EC tablet Take 1 tablet (500 mg total) by mouth 2 (two) times daily with a meal.   No current facility-administered medications on file prior to visit.    Allergies:  No Known Allergies  Social History:  Social History   Socioeconomic History   Marital status: Divorced    Spouse name: Not on file   Number of children: Not on file   Years of education: Not on file   Highest education level: Some college, no degree  Occupational History   Occupation: Tax inspector part time   Tobacco Use   Smoking status: Former    Types: Cigarettes    Quit date: 10/30/1989    Years since quitting: 31.6   Smokeless tobacco: Never  Vaping Use   Vaping Use: Never used  Substance and Sexual Activity   Alcohol use: Yes    Comment: on occasion   Drug use: No   Sexual activity: Not Currently  Other Topics Concern   Not on file  Social History Narrative   Not on file   Social Determinants of Health   Financial Resource Strain: Not on file  Food Insecurity: Not on file  Transportation Needs: Not on file  Physical Activity: Not on file  Stress: Not on file  Social Connections: Not on file  Intimate Partner Violence: Not on file   Social History   Tobacco Use  Smoking Status Former   Types: Cigarettes   Quit date: 10/30/1989   Years since quitting: 31.6  Smokeless Tobacco Never   Social History   Substance and Sexual Activity  Alcohol Use Yes   Comment: on occasion    Family History:  Family History  Problem Relation Age of Onset   Heart disease Mother    COPD Mother    Heart disease Father    Hypertension Father    Heart disease Maternal Grandfather    Alzheimer's disease Paternal Grandmother    Breast cancer Neg Hx     Past medical history, surgical history, medications, allergies, family  history and social history reviewed with patient today and changes made to appropriate areas of the chart.   Review of Systems  Constitutional: Negative.   HENT: Negative.    Eyes: Negative.   Respiratory: Negative.    Cardiovascular: Negative.   Gastrointestinal: Negative.   Genitourinary: Negative.   Musculoskeletal: Negative.   Skin:  Positive for rash. Negative for itching.  Neurological: Negative.   Endo/Heme/Allergies:  Negative for environmental allergies and polydipsia. Bruises/bleeds easily.  Psychiatric/Behavioral: Negative.    All  other ROS negative except what is listed above and in the HPI.      Objective:    BP 116/69   Pulse 68   Temp 98 F (36.7 C) (Oral)   Ht 5' 8.7" (1.745 m)   Wt 212 lb 6.4 oz (96.3 kg)   SpO2 96%   BMI 31.64 kg/m   Wt Readings from Last 3 Encounters:  06/10/21 212 lb 6.4 oz (96.3 kg)  05/09/21 215 lb (97.5 kg)  03/03/21 206 lb 6.4 oz (93.6 kg)    Physical Exam Vitals and nursing note reviewed.  Constitutional:      General: She is not in acute distress.    Appearance: Normal appearance. She is not ill-appearing, toxic-appearing or diaphoretic.  HENT:     Head: Normocephalic and atraumatic.     Right Ear: Tympanic membrane, ear canal and external ear normal. There is no impacted cerumen.     Left Ear: Tympanic membrane, ear canal and external ear normal. There is no impacted cerumen.     Nose: Nose normal. No congestion or rhinorrhea.     Mouth/Throat:     Mouth: Mucous membranes are moist.     Pharynx: Oropharynx is clear. No oropharyngeal exudate or posterior oropharyngeal erythema.  Eyes:     General: No scleral icterus.       Right eye: No discharge.        Left eye: No discharge.     Extraocular Movements: Extraocular movements intact.     Conjunctiva/sclera: Conjunctivae normal.     Pupils: Pupils are equal, round, and reactive to light.  Neck:     Vascular: No carotid bruit.  Cardiovascular:     Rate and Rhythm:  Normal rate and regular rhythm.     Pulses: Normal pulses.     Heart sounds: No murmur heard.   No friction rub. No gallop.  Pulmonary:     Effort: Pulmonary effort is normal. No respiratory distress.     Breath sounds: Normal breath sounds. No stridor. No wheezing, rhonchi or rales.  Chest:     Chest wall: No tenderness.  Abdominal:     General: Abdomen is flat. Bowel sounds are normal. There is no distension.     Palpations: Abdomen is soft. There is no mass.     Tenderness: There is no abdominal tenderness. There is no right CVA tenderness, left CVA tenderness, guarding or rebound.     Hernia: No hernia is present.  Genitourinary:    Comments: Breast and pelvic exams deferred with shared decision making Musculoskeletal:        General: No swelling, tenderness, deformity or signs of injury.     Cervical back: Normal range of motion and neck supple. No rigidity. No muscular tenderness.     Right lower leg: No edema.     Left lower leg: No edema.  Lymphadenopathy:     Cervical: No cervical adenopathy.  Skin:    General: Skin is warm and dry.     Capillary Refill: Capillary refill takes less than 2 seconds.     Coloration: Skin is not jaundiced or pale.     Findings: No bruising, erythema, lesion or rash.  Neurological:     General: No focal deficit present.     Mental Status: She is alert and oriented to person, place, and time. Mental status is at baseline.     Cranial Nerves: No cranial nerve deficit.     Sensory: No sensory deficit.  Motor: No weakness.     Coordination: Coordination normal.     Gait: Gait normal.     Deep Tendon Reflexes: Reflexes normal.  Psychiatric:        Mood and Affect: Mood normal.        Behavior: Behavior normal.        Thought Content: Thought content normal.        Judgment: Judgment normal.    Results for orders placed or performed in visit on 93/71/69  Basic metabolic panel  Result Value Ref Range   Glucose 72 65 - 99 mg/dL   BUN 29  (H) 8 - 27 mg/dL   Creatinine, Ser 0.91 0.57 - 1.00 mg/dL   eGFR 68 >59 mL/min/1.73   BUN/Creatinine Ratio 32 (H) 12 - 28   Sodium 145 (H) 134 - 144 mmol/L   Potassium 4.3 3.5 - 5.2 mmol/L   Chloride 107 (H) 96 - 106 mmol/L   CO2 22 20 - 29 mmol/L   Calcium 8.7 8.7 - 10.3 mg/dL      Assessment & Plan:   Problem List Items Addressed This Visit       Cardiovascular and Mediastinum   Aortic atherosclerosis (HCC)    Will keep BP and cholesterol under good control. Continue diet and exercise. Call with any concerns.       Relevant Medications   lisinopril (ZESTRIL) 10 MG tablet   Other Relevant Orders   Lipid Panel w/o Chol/HDL Ratio     Musculoskeletal and Integument   Osteoporosis    Due for repeat DEXA. Ordered today. Await results.       Relevant Orders   DG Bone Density   Drug rash   Relevant Orders   Ambulatory referral to Dermatology     Genitourinary   Benign hypertensive renal disease    Under good control on current regimen. Continue current regimen. Continue to monitor. Call with any concerns. Refills given. Labs drawn today.       Relevant Orders   CBC with Differential/Platelet   Comprehensive metabolic panel   Urinalysis, Routine w reflex microscopic   TSH   Microalbumin, Urine Waived     Other   Iron deficiency anemia    Rechecking labs today. Await results. Treat as needed.       Relevant Medications   ferrous sulfate 325 (65 FE) MG EC tablet   Binge eating disorder    Under good control on current regimen. Continue current regimen. Continue to monitor. Call with any concerns. Refills given for 3 months. Follow up 3 months.        Depression, recurrent ([place])    Under good control on current regimen. Continue current regimen. Continue to monitor. Call with any concerns. Refills given.        Relevant Medications   FLUoxetine (PROZAC) 20 MG capsule   Other Visit Diagnoses     Routine general medical examination at a health care facility     -  Primary   Vaccines up to date. Screening labs checked today. Mammo and colonoscopy up to date. DEXA ordered. Continue diet and exercise. Call with any concerns.    Encounter for screening mammogram for malignant neoplasm of breast       Relevant Orders   MM 3D SCREEN BREAST BILATERAL        Follow up plan: Return in about 3 months (around 09/10/2021).   LABORATORY TESTING:  - Pap smear: not applicable  IMMUNIZATIONS:   - Tdap:  Tetanus vaccination status reviewed: last tetanus booster within 10 years. - Influenza: Postponed to flu season - Pneumovax: Up to date - Prevnar: Up to date - COVID: Up to date - Shingrix vaccine: Up to date  SCREENING: -Mammogram: Up to date  - Colonoscopy: Up to date   PATIENT COUNSELING:   Advised to take 1 mg of folate supplement per day if capable of pregnancy.   Sexuality: Discussed sexually transmitted diseases, partner selection, use of condoms, avoidance of unintended pregnancy  and contraceptive alternatives.   Advised to avoid cigarette smoking.  I discussed with the patient that most people either abstain from alcohol or drink within safe limits (<=14/week and <=4 drinks/occasion for males, <=7/weeks and <= 3 drinks/occasion for females) and that the risk for alcohol disorders and other health effects rises proportionally with the number of drinks per week and how often a drinker exceeds daily limits.  Discussed cessation/primary prevention of drug use and availability of treatment for abuse.   Diet: Encouraged to adjust caloric intake to maintain  or achieve ideal body weight, to reduce intake of dietary saturated fat and total fat, to limit sodium intake by avoiding high sodium foods and not adding table salt, and to maintain adequate dietary potassium and calcium preferably from fresh fruits, vegetables, and low-fat dairy products.    stressed the importance of regular exercise  Injury prevention: Discussed safety belts, safety  helmets, smoke detector, smoking near bedding or upholstery.   Dental health: Discussed importance of regular tooth brushing, flossing, and dental visits.    NEXT PREVENTATIVE PHYSICAL DUE IN 1 YEAR. Return in about 3 months (around 09/10/2021).

## 2021-06-10 NOTE — Assessment & Plan Note (Signed)
Under good control on current regimen. Continue current regimen. Continue to monitor. Call with any concerns. Refills given. Labs drawn today.   

## 2021-06-10 NOTE — Assessment & Plan Note (Signed)
Under good control on current regimen. Continue current regimen. Continue to monitor. Call with any concerns. Refills given.   

## 2021-06-10 NOTE — Assessment & Plan Note (Signed)
Needs new referral to dermatology. Call with any concerns. Continue to monitor.

## 2021-06-11 LAB — CBC WITH DIFFERENTIAL/PLATELET
Basophils Absolute: 0 10*3/uL (ref 0.0–0.2)
Basos: 1 %
EOS (ABSOLUTE): 0.2 10*3/uL (ref 0.0–0.4)
Eos: 4 %
Hematocrit: 41 % (ref 34.0–46.6)
Hemoglobin: 13.4 g/dL (ref 11.1–15.9)
Immature Grans (Abs): 0 10*3/uL (ref 0.0–0.1)
Immature Granulocytes: 1 %
Lymphocytes Absolute: 0.5 10*3/uL — ABNORMAL LOW (ref 0.7–3.1)
Lymphs: 13 %
MCH: 30.7 pg (ref 26.6–33.0)
MCHC: 32.7 g/dL (ref 31.5–35.7)
MCV: 94 fL (ref 79–97)
Monocytes Absolute: 0.3 10*3/uL (ref 0.1–0.9)
Monocytes: 8 %
Neutrophils Absolute: 3 10*3/uL (ref 1.4–7.0)
Neutrophils: 73 %
Platelets: 232 10*3/uL (ref 150–450)
RBC: 4.37 x10E6/uL (ref 3.77–5.28)
RDW: 12.2 % (ref 11.7–15.4)
WBC: 4.1 10*3/uL (ref 3.4–10.8)

## 2021-06-11 LAB — COMPREHENSIVE METABOLIC PANEL
ALT: 16 IU/L (ref 0–32)
AST: 20 IU/L (ref 0–40)
Albumin/Globulin Ratio: 1.8 (ref 1.2–2.2)
Albumin: 4 g/dL (ref 3.8–4.8)
Alkaline Phosphatase: 81 IU/L (ref 44–121)
BUN/Creatinine Ratio: 35 — ABNORMAL HIGH (ref 12–28)
BUN: 33 mg/dL — ABNORMAL HIGH (ref 8–27)
Bilirubin Total: 0.6 mg/dL (ref 0.0–1.2)
CO2: 23 mmol/L (ref 20–29)
Calcium: 9.1 mg/dL (ref 8.7–10.3)
Chloride: 106 mmol/L (ref 96–106)
Creatinine, Ser: 0.94 mg/dL (ref 0.57–1.00)
Globulin, Total: 2.2 g/dL (ref 1.5–4.5)
Glucose: 76 mg/dL (ref 65–99)
Potassium: 4.3 mmol/L (ref 3.5–5.2)
Sodium: 142 mmol/L (ref 134–144)
Total Protein: 6.2 g/dL (ref 6.0–8.5)
eGFR: 66 mL/min/{1.73_m2} (ref >59)

## 2021-06-11 LAB — LIPID PANEL W/O CHOL/HDL RATIO
Cholesterol, Total: 191 mg/dL (ref 100–199)
HDL: 77 mg/dL (ref >39)
LDL Chol Calc (NIH): 97 mg/dL (ref 0–99)
Triglycerides: 96 mg/dL (ref 0–149)
VLDL Cholesterol Cal: 17 mg/dL (ref 5–40)

## 2021-06-11 LAB — TSH: TSH: 2.19 u[IU]/mL (ref 0.450–4.500)

## 2021-06-13 ENCOUNTER — Other Ambulatory Visit: Payer: Self-pay | Admitting: Family Medicine

## 2021-06-13 ENCOUNTER — Other Ambulatory Visit: Payer: Self-pay

## 2021-06-13 ENCOUNTER — Other Ambulatory Visit: Payer: PPO

## 2021-06-13 LAB — MICROSCOPIC EXAMINATION
Bacteria, UA: NONE SEEN
RBC, Urine: NONE SEEN /hpf (ref 0–2)

## 2021-06-13 LAB — URINALYSIS, ROUTINE W REFLEX MICROSCOPIC
Bilirubin, UA: NEGATIVE
Glucose, UA: NEGATIVE
Ketones, UA: NEGATIVE
Leukocytes,UA: NEGATIVE
Nitrite, UA: NEGATIVE
Protein,UA: NEGATIVE
RBC, UA: NEGATIVE
Specific Gravity, UA: >1.03 — ABNORMAL HIGH (ref 1.005–1.030)
Urobilinogen, Ur: 4 mg/dL — ABNORMAL HIGH (ref 0.2–1.0)
pH, UA: 6 (ref 5.0–7.5)

## 2021-06-13 LAB — MICROALBUMIN, URINE WAIVED
Creatinine, Urine Waived: 200 mg/dL (ref 10–300)
Microalb, Ur Waived: 30 mg/L — ABNORMAL HIGH (ref 0–19)
Microalb/Creat Ratio: <30 mg/g (ref <30)

## 2021-06-13 NOTE — Telephone Encounter (Signed)
Approved on 06/10/21 for #90 tabs and one refill. This request is no longer needed.

## 2021-06-14 DIAGNOSIS — D2262 Melanocytic nevi of left upper limb, including shoulder: Secondary | ICD-10-CM | POA: Diagnosis not present

## 2021-06-14 DIAGNOSIS — L821 Other seborrheic keratosis: Secondary | ICD-10-CM | POA: Diagnosis not present

## 2021-06-14 DIAGNOSIS — D2272 Melanocytic nevi of left lower limb, including hip: Secondary | ICD-10-CM | POA: Diagnosis not present

## 2021-06-14 DIAGNOSIS — L920 Granuloma annulare: Secondary | ICD-10-CM | POA: Diagnosis not present

## 2021-06-14 DIAGNOSIS — C44319 Basal cell carcinoma of skin of other parts of face: Secondary | ICD-10-CM | POA: Diagnosis not present

## 2021-06-14 DIAGNOSIS — D2261 Melanocytic nevi of right upper limb, including shoulder: Secondary | ICD-10-CM | POA: Diagnosis not present

## 2021-06-14 DIAGNOSIS — D485 Neoplasm of uncertain behavior of skin: Secondary | ICD-10-CM | POA: Diagnosis not present

## 2021-06-14 DIAGNOSIS — D225 Melanocytic nevi of trunk: Secondary | ICD-10-CM | POA: Diagnosis not present

## 2021-06-14 DIAGNOSIS — D2271 Melanocytic nevi of right lower limb, including hip: Secondary | ICD-10-CM | POA: Diagnosis not present

## 2021-07-16 ENCOUNTER — Telehealth: Payer: Self-pay | Admitting: Family Medicine

## 2021-07-16 NOTE — Telephone Encounter (Signed)
Copied from [place] [id number]. Topic: Medicare AWV >> Jul 16, 2021  9:51 AM Cher Nakai R wrote: Reason for CRM:  Left message for patient to call back and schedule the Medicare Annual Wellness Visit (AWV) virtually or by telephone.  Last AWV 03/22/2020  Please schedule at anytime with [facility]-Nurse Health Advisor.  45 minute appointment  Any questions, please call me at [phone]

## 2021-08-29 DIAGNOSIS — C44111 Basal cell carcinoma of skin of unspecified eyelid, including canthus: Secondary | ICD-10-CM | POA: Diagnosis not present

## 2021-08-29 DIAGNOSIS — L988 Other specified disorders of the skin and subcutaneous tissue: Secondary | ICD-10-CM | POA: Diagnosis not present

## 2021-08-29 DIAGNOSIS — L578 Other skin changes due to chronic exposure to nonionizing radiation: Secondary | ICD-10-CM | POA: Diagnosis not present

## 2021-08-29 DIAGNOSIS — L814 Other melanin hyperpigmentation: Secondary | ICD-10-CM | POA: Diagnosis not present

## 2021-09-12 ENCOUNTER — Other Ambulatory Visit: Payer: Self-pay

## 2021-09-12 ENCOUNTER — Ambulatory Visit (INDEPENDENT_AMBULATORY_CARE_PROVIDER_SITE_OTHER): Payer: PPO | Admitting: Family Medicine

## 2021-09-12 ENCOUNTER — Encounter: Payer: Self-pay | Admitting: Family Medicine

## 2021-09-12 VITALS — BP 128/80 | HR 69 | Temp 98.2°F | Wt 221.6 lb

## 2021-09-12 DIAGNOSIS — F5081 Binge eating disorder: Secondary | ICD-10-CM

## 2021-09-12 DIAGNOSIS — Z23 Encounter for immunization: Secondary | ICD-10-CM | POA: Diagnosis not present

## 2021-09-12 MED ORDER — LISDEXAMFETAMINE DIMESYLATE 60 MG PO CAPS: 60.0000 mg | ORAL_CAPSULE | ORAL | 0 refills | Status: DC

## 2021-09-12 MED ORDER — LISDEXAMFETAMINE DIMESYLATE 60 MG PO CAPS
60.0000 mg | ORAL_CAPSULE | ORAL | 0 refills | Status: DC
Start: 2021-10-12 — End: 2021-12-20

## 2021-09-12 NOTE — Progress Notes (Signed)
BP 128/80   Pulse 69   Temp 98.2 F (36.8 C)   Wt 221 lb 9.6 oz (100.5 kg)   SpO2 98%   BMI 33.01 kg/m    Subjective:    Patient ID: Mckenzie Webster, female    DOB: Aug 18, 1951, 70 y.o.   MRN: [id number]  HPI: Mckenzie Webster is a 70 y.o. female  Chief Complaint  Patient presents with   Binge eating disorder   Mckenzie Webster has been feeling well. Vyvanse has been helping with her binge eating. She notes that she's up a little on her weight and is going to work on getting more exercise. No side effects. She is generally feeling well. No other concerns or complaints at this time.   Relevant past medical, surgical, family and social history reviewed and updated as indicated. Interim medical history since our last visit reviewed. Allergies and medications reviewed and updated.  Review of Systems  Constitutional: Negative.   Respiratory: Negative.    Cardiovascular: Negative.   Gastrointestinal: Negative.   Musculoskeletal: Negative.   Neurological: Negative.   Psychiatric/Behavioral: Negative.     Per HPI unless specifically indicated above     Objective:    BP 128/80   Pulse 69   Temp 98.2 F (36.8 C)   Wt 221 lb 9.6 oz (100.5 kg)   SpO2 98%   BMI 33.01 kg/m   Wt Readings from Last 3 Encounters:  09/12/21 221 lb 9.6 oz (100.5 kg)  06/10/21 212 lb 6.4 oz (96.3 kg)  05/09/21 215 lb (97.5 kg)    Physical Exam Vitals and nursing note reviewed.  Constitutional:      General: She is not in acute distress.    Appearance: Normal appearance. She is not ill-appearing, toxic-appearing or diaphoretic.  HENT:     Head: Normocephalic and atraumatic.     Right Ear: External ear normal.     Left Ear: External ear normal.     Nose: Nose normal.     Mouth/Throat:     Mouth: Mucous membranes are moist.     Pharynx: Oropharynx is clear.  Eyes:     General: No scleral icterus.       Right eye: No discharge.        Left eye: No discharge.     Extraocular Movements: Extraocular  movements intact.     Conjunctiva/sclera: Conjunctivae normal.     Pupils: Pupils are equal, round, and reactive to light.  Cardiovascular:     Rate and Rhythm: Normal rate and regular rhythm.     Pulses: Normal pulses.     Heart sounds: Normal heart sounds. No murmur heard.   No friction rub. No gallop.  Pulmonary:     Effort: Pulmonary effort is normal. No respiratory distress.     Breath sounds: Normal breath sounds. No stridor. No wheezing, rhonchi or rales.  Chest:     Chest wall: No tenderness.  Musculoskeletal:        General: Normal range of motion.     Cervical back: Normal range of motion and neck supple.  Skin:    General: Skin is warm and dry.     Capillary Refill: Capillary refill takes less than 2 seconds.     Coloration: Skin is not jaundiced or pale.     Findings: No bruising, erythema, lesion or rash.  Neurological:     General: No focal deficit present.     Mental Status: She is alert and oriented to person,  place, and time. Mental status is at baseline.  Psychiatric:        Mood and Affect: Mood normal.        Behavior: Behavior normal.        Thought Content: Thought content normal.        Judgment: Judgment normal.    Results for orders placed or performed in visit on 06/10/21  Microscopic Examination   Urine  Result Value Ref Range   WBC, UA 0-5 0 - 5 /hpf   RBC None seen 0 - 2 /hpf   Epithelial Cells (non renal) 0-10 0 - 10 /hpf   Mucus, UA Present (A) Not Estab.   Bacteria, UA None seen None seen/Few  CBC with Differential/Platelet  Result Value Ref Range   WBC 4.1 3.4 - 10.8 x10E3/uL   RBC 4.37 3.77 - 5.28 x10E6/uL   Hemoglobin 13.4 11.1 - 15.9 g/dL   Hematocrit 41.0 34.0 - 46.6 %   MCV 94 79 - 97 fL   MCH 30.7 26.6 - 33.0 pg   MCHC 32.7 31.5 - 35.7 g/dL   RDW 12.2 11.7 - 15.4 %   Platelets 232 150 - 450 x10E3/uL   Neutrophils 73 Not Estab. %   Lymphs 13 Not Estab. %   Monocytes 8 Not Estab. %   Eos 4 Not Estab. %   Basos 1 Not Estab. %    Neutrophils Absolute 3.0 1.4 - 7.0 x10E3/uL   Lymphocytes Absolute 0.5 (L) 0.7 - 3.1 x10E3/uL   Monocytes Absolute 0.3 0.1 - 0.9 x10E3/uL   EOS (ABSOLUTE) 0.2 0.0 - 0.4 x10E3/uL   Basophils Absolute 0.0 0.0 - 0.2 x10E3/uL   Immature Granulocytes 1 Not Estab. %   Immature Grans (Abs) 0.0 0.0 - 0.1 x10E3/uL  Comprehensive metabolic panel  Result Value Ref Range   Glucose 76 65 - 99 mg/dL   BUN 33 (H) 8 - 27 mg/dL   Creatinine, Ser 0.94 0.57 - 1.00 mg/dL   eGFR 66 >59 mL/min/1.73   BUN/Creatinine Ratio 35 (H) 12 - 28   Sodium 142 134 - 144 mmol/L   Potassium 4.3 3.5 - 5.2 mmol/L   Chloride 106 96 - 106 mmol/L   CO2 23 20 - 29 mmol/L   Calcium 9.1 8.7 - 10.3 mg/dL   Total Protein 6.2 6.0 - 8.5 g/dL   Albumin 4.0 3.8 - 4.8 g/dL   Globulin, Total 2.2 1.5 - 4.5 g/dL   Albumin/Globulin Ratio 1.8 1.2 - 2.2   Bilirubin Total 0.6 0.0 - 1.2 mg/dL   Alkaline Phosphatase 81 44 - 121 IU/L   AST 20 0 - 40 IU/L   ALT 16 0 - 32 IU/L  Lipid Panel w/o Chol/HDL Ratio  Result Value Ref Range   Cholesterol, Total 191 100 - 199 mg/dL   Triglycerides 96 0 - 149 mg/dL   HDL 77 >39 mg/dL   VLDL Cholesterol Cal 17 5 - 40 mg/dL   LDL Chol Calc (NIH) 97 0 - 99 mg/dL  Urinalysis, Routine w reflex microscopic  Result Value Ref Range   Specific Gravity, UA >1.030 (H) 1.005 - 1.030   pH, UA 6.0 5.0 - 7.5   Color, UA Yellow Yellow   Appearance Ur Clear Clear   Leukocytes,UA Negative Negative   Protein,UA Negative Negative/Trace   Glucose, UA Negative Negative   Ketones, UA Negative Negative   RBC, UA Negative Negative   Bilirubin, UA Negative Negative   Urobilinogen, Ur 4.0 (H) 0.2 -  1.0 mg/dL   Nitrite, UA Negative Negative   Microscopic Examination See below:   TSH  Result Value Ref Range   TSH 2.190 0.450 - 4.500 uIU/mL  Microalbumin, Urine Waived  Result Value Ref Range   Microalb, Ur Waived 30 (H) 0 - 19 mg/L   Creatinine, Urine Waived 200 10 - 300 mg/dL   Microalb/Creat Ratio <30 <30  mg/g      Assessment & Plan:   Problem List Items Addressed This Visit       Other   Binge eating disorder - Primary    Under good control on current regimen. Continue current regimen. Continue to monitor. Call with any concerns. Refills given for 3 months. Follow up 3 months.        Other Visit Diagnoses     Need for influenza vaccination       Relevant Orders   Flu Vaccine QUAD High Dose(Fluad)        Follow up plan: Return in about 3 months (around 12/13/2021), or Before 12/11/21.

## 2021-09-12 NOTE — Assessment & Plan Note (Signed)
Under good control on current regimen. Continue current regimen. Continue to monitor. Call with any concerns. Refills given for 3 months. Follow up 3 months.    

## 2021-09-29 ENCOUNTER — Telehealth: Payer: Self-pay | Admitting: Family Medicine

## 2021-09-29 NOTE — Telephone Encounter (Signed)
Copied from [place] [id number]. Topic: Medicare AWV >> Sep 29, 2021  2:10 PM Lavonia Drafts wrote: Reason for CRM:  Left message for patient to call back and schedule the Medicare Annual Wellness Visit (AWV) virtually or by telephone.  Last AWV 03/22/20  Please schedule at anytime with [facility]-Nurse Health Advisor.  45 minute appointment  Any questions, please call me at [phone]

## 2021-11-15 DIAGNOSIS — D225 Melanocytic nevi of trunk: Secondary | ICD-10-CM | POA: Diagnosis not present

## 2021-11-15 DIAGNOSIS — Z85828 Personal history of other malignant neoplasm of skin: Secondary | ICD-10-CM | POA: Diagnosis not present

## 2021-11-15 DIAGNOSIS — Z08 Encounter for follow-up examination after completed treatment for malignant neoplasm: Secondary | ICD-10-CM | POA: Diagnosis not present

## 2021-11-15 DIAGNOSIS — L821 Other seborrheic keratosis: Secondary | ICD-10-CM | POA: Diagnosis not present

## 2021-11-17 ENCOUNTER — Telehealth: Payer: Self-pay | Admitting: Family Medicine

## 2021-11-17 NOTE — Telephone Encounter (Signed)
Copied from [place] [id number]. Topic: Medicare AWV >> Nov 17, 2021 10:43 AM Lavonia Drafts wrote: Reason for CRM: Left message for patient to call back and schedule the Medicare Annual Wellness Visit (AWV) virtually or by telephone.  Last AWV 03/22/20  Please schedule at anytime with [facility]-Nurse Health Advisor.  45 minute appointment  Any questions, please call me at [phone]

## 2021-12-13 ENCOUNTER — Ambulatory Visit: Payer: PPO | Admitting: Family Medicine

## 2021-12-20 ENCOUNTER — Other Ambulatory Visit: Payer: Self-pay

## 2021-12-20 ENCOUNTER — Ambulatory Visit (INDEPENDENT_AMBULATORY_CARE_PROVIDER_SITE_OTHER): Payer: PPO | Admitting: Family Medicine

## 2021-12-20 ENCOUNTER — Encounter: Payer: Self-pay | Admitting: Family Medicine

## 2021-12-20 VITALS — BP 126/79 | HR 61 | Temp 98.1°F | Ht 67.75 in | Wt 219.2 lb

## 2021-12-20 DIAGNOSIS — D509 Iron deficiency anemia, unspecified: Secondary | ICD-10-CM | POA: Diagnosis not present

## 2021-12-20 DIAGNOSIS — F339 Major depressive disorder, recurrent, unspecified: Secondary | ICD-10-CM

## 2021-12-20 DIAGNOSIS — F5081 Binge eating disorder: Secondary | ICD-10-CM | POA: Diagnosis not present

## 2021-12-20 DIAGNOSIS — I700 Atherosclerosis of aorta: Secondary | ICD-10-CM

## 2021-12-20 DIAGNOSIS — I129 Hypertensive chronic kidney disease with stage 1 through stage 4 chronic kidney disease, or unspecified chronic kidney disease: Secondary | ICD-10-CM

## 2021-12-20 DIAGNOSIS — M25562 Pain in left knee: Secondary | ICD-10-CM | POA: Diagnosis not present

## 2021-12-20 DIAGNOSIS — M7061 Trochanteric bursitis, right hip: Secondary | ICD-10-CM | POA: Diagnosis not present

## 2021-12-20 MED ORDER — LISDEXAMFETAMINE DIMESYLATE 60 MG PO CAPS
60.0000 mg | ORAL_CAPSULE | ORAL | 0 refills | Status: DC
Start: 2022-01-19 — End: 2022-03-20

## 2021-12-20 MED ORDER — LISDEXAMFETAMINE DIMESYLATE 60 MG PO CAPS: 60.0000 mg | ORAL_CAPSULE | ORAL | 0 refills | Status: DC

## 2021-12-20 MED ORDER — LISINOPRIL 10 MG PO TABS: 10.0000 mg | ORAL_TABLET | Freq: Every day | ORAL | 1 refills | Status: DC

## 2021-12-20 MED ORDER — LISDEXAMFETAMINE DIMESYLATE 60 MG PO CAPS
60.0000 mg | ORAL_CAPSULE | ORAL | 0 refills | Status: DC
Start: 2021-12-20 — End: 2022-03-20

## 2021-12-20 MED ORDER — FAMOTIDINE 20 MG PO TABS: 20.0000 mg | ORAL_TABLET | Freq: Two times a day (BID) | ORAL | 1 refills | Status: DC

## 2021-12-20 MED ORDER — FLUOXETINE HCL 20 MG PO CAPS: 60.0000 mg | ORAL_CAPSULE | Freq: Every day | ORAL | 1 refills | Status: DC

## 2021-12-20 NOTE — Assessment & Plan Note (Signed)
Checking labs today. Continue to monitor. Call with any concerns.

## 2021-12-20 NOTE — Assessment & Plan Note (Signed)
Rechecking labs today. Await results. Treat as needed.  °

## 2021-12-20 NOTE — Assessment & Plan Note (Signed)
Under good control on current regimen. Continue current regimen. Continue to monitor. Call with any concerns. Refills given. Labs drawn today.   

## 2021-12-20 NOTE — Assessment & Plan Note (Signed)
Under good control on current regimen. Continue current regimen. Continue to monitor. Call with any concerns. Refills given for 3 months. Follow up 3 months.    

## 2021-12-20 NOTE — Assessment & Plan Note (Signed)
Under good control on current regimen. Continue current regimen. Continue to monitor. Call with any concerns. Refills given.   

## 2021-12-20 NOTE — Progress Notes (Signed)
BP 126/79    Pulse 61    Temp 98.1 F (36.7 C) (Oral)    Ht 5' 7.75" (1.721 m)    Wt 219 lb 3.2 oz (99.4 kg)    SpO2 100%    BMI 33.58 kg/m    Subjective:    Patient ID: Mckenzie Webster, female    DOB: 02/19/1951, 71 y.o.   MRN: [id number]  HPI: Mckenzie Webster is a 71 y.o. female  Chief Complaint  Patient presents with   Anemia   Hip Pain    Patient states she is having discomfort in her right hip. Patient states she was given injections in the past.    Fall    Patient states she was walking down some steps and someone called her to hand her something and she loss balance. Patient states she caught her self with left knee. Patient denies taking any over the counter medication to help with the discomfort. Patient states if she was to have to get down on her knee she feels the discomfort. Patient state she noticed some swelling.    Continues with pain in her hip. Started hurting again in the last couple of months. It's aching and sore and hurts to lay on it. Nothing is making it better. Laying on it makes it worse. Pain does not radiate. It's constant.   [place] about 2.5 weeks ago. Has been having pain in her L knee since then. She has been having swelling. It only hurts when she puts pressure on it. Not bothering her otherwise.   Binge eating is doing OK. Weight stable. Handling her diet well. No other concerns at this time. No side effects.   HYPERTENSION Hypertension status: controlled  Satisfied with current treatment? yes Duration of hypertension: chronic BP monitoring frequency:  not checking BP medication side effects:  no Medication compliance: excellent compliance Previous BP meds:lisinopril Aspirin: no Recurrent headaches: no Visual changes: no Palpitations: no Dyspnea: no Chest pain: no Lower extremity edema: no Dizzy/lightheaded: no  DEPRESSION Mood status: controlled Satisfied with current treatment?: yes Symptom severity: mild  Duration of current treatment :  chronic Side effects: no Medication compliance: excellent compliance Psychotherapy/counseling: no  Previous psychiatric medications: prozac Depressed mood: no Anxious mood: no Anhedonia: no Significant weight loss or gain: no Insomnia: no  Fatigue: no Feelings of worthlessness or guilt: no Impaired concentration/indecisiveness: no Suicidal ideations: no Hopelessness: no Crying spells: no Depression screen [facility] 2/9 12/20/2021 09/12/2021 06/10/2021 03/03/2021 12/07/2020  Decreased Interest 0 0 0 0 0  Down, Depressed, Hopeless 0 0 0 0 0  PHQ - 2 Score 0 0 0 0 0  Altered sleeping 0 0 0 0 0  Tired, decreased energy 0 0 0 0 0  Change in appetite 0 2 0 0 1  Feeling bad or failure about yourself  0 0 0 0 0  Trouble concentrating 0 0 0 0 0  Moving slowly or fidgety/restless 0 0 0 0 0  Suicidal thoughts 0 0 0 0 0  PHQ-9 Score 0 2 0 0 1  Difficult doing work/chores Not difficult at all - Not difficult at all - Not difficult at all  Some recent data might be hidden     Relevant past medical, surgical, family and social history reviewed and updated as indicated. Interim medical history since our last visit reviewed. Allergies and medications reviewed and updated.  Review of Systems  Constitutional: Negative.   Respiratory: Negative.    Cardiovascular: Negative.  Gastrointestinal: Negative.   Musculoskeletal:  Positive for arthralgias and myalgias. Negative for back pain, gait problem, joint swelling, neck pain and neck stiffness.  Skin: Negative.   Neurological: Negative.   Psychiatric/Behavioral: Negative.     Per HPI unless specifically indicated above     Objective:    BP 126/79    Pulse 61    Temp 98.1 F (36.7 C) (Oral)    Ht 5' 7.75" (1.721 m)    Wt 219 lb 3.2 oz (99.4 kg)    SpO2 100%    BMI 33.58 kg/m   Wt Readings from Last 3 Encounters:  12/20/21 219 lb 3.2 oz (99.4 kg)  09/12/21 221 lb 9.6 oz (100.5 kg)  06/10/21 212 lb 6.4 oz (96.3 kg)    Physical Exam Vitals and  nursing note reviewed.  Constitutional:      General: She is not in acute distress.    Appearance: Normal appearance. She is not ill-appearing, toxic-appearing or diaphoretic.  HENT:     Head: Normocephalic and atraumatic.     Right Ear: External ear normal.     Left Ear: External ear normal.     Nose: Nose normal.     Mouth/Throat:     Mouth: Mucous membranes are moist.     Pharynx: Oropharynx is clear.  Eyes:     General: No scleral icterus.       Right eye: No discharge.        Left eye: No discharge.     Extraocular Movements: Extraocular movements intact.     Conjunctiva/sclera: Conjunctivae normal.     Pupils: Pupils are equal, round, and reactive to light.  Cardiovascular:     Rate and Rhythm: Normal rate and regular rhythm.     Pulses: Normal pulses.     Heart sounds: Normal heart sounds. No murmur heard.   No friction rub. No gallop.  Pulmonary:     Effort: Pulmonary effort is normal. No respiratory distress.     Breath sounds: Normal breath sounds. No stridor. No wheezing, rhonchi or rales.  Chest:     Chest wall: No tenderness.  Musculoskeletal:        General: Swelling (suprapatellar L knee effusion) and tenderness (over L trochanteric bursa) present. Normal range of motion.     Cervical back: Normal range of motion and neck supple.  Skin:    General: Skin is warm and dry.     Capillary Refill: Capillary refill takes less than 2 seconds.     Coloration: Skin is not jaundiced or pale.     Findings: No bruising, erythema, lesion or rash.  Neurological:     General: No focal deficit present.     Mental Status: She is alert and oriented to person, place, and time. Mental status is at baseline.  Psychiatric:        Mood and Affect: Mood normal.        Behavior: Behavior normal.        Thought Content: Thought content normal.        Judgment: Judgment normal.    Results for orders placed or performed in visit on 06/10/21  Microscopic Examination   Urine  Result  Value Ref Range   WBC, UA 0-5 0 - 5 /hpf   RBC None seen 0 - 2 /hpf   Epithelial Cells (non renal) 0-10 0 - 10 /hpf   Mucus, UA Present (A) Not Estab.   Bacteria, UA None seen None seen/Few  CBC  with Differential/Platelet  Result Value Ref Range   WBC 4.1 3.4 - 10.8 x10E3/uL   RBC 4.37 3.77 - 5.28 x10E6/uL   Hemoglobin 13.4 11.1 - 15.9 g/dL   Hematocrit 41.0 34.0 - 46.6 %   MCV 94 79 - 97 fL   MCH 30.7 26.6 - 33.0 pg   MCHC 32.7 31.5 - 35.7 g/dL   RDW 12.2 11.7 - 15.4 %   Platelets 232 150 - 450 x10E3/uL   Neutrophils 73 Not Estab. %   Lymphs 13 Not Estab. %   Monocytes 8 Not Estab. %   Eos 4 Not Estab. %   Basos 1 Not Estab. %   Neutrophils Absolute 3.0 1.4 - 7.0 x10E3/uL   Lymphocytes Absolute 0.5 (L) 0.7 - 3.1 x10E3/uL   Monocytes Absolute 0.3 0.1 - 0.9 x10E3/uL   EOS (ABSOLUTE) 0.2 0.0 - 0.4 x10E3/uL   Basophils Absolute 0.0 0.0 - 0.2 x10E3/uL   Immature Granulocytes 1 Not Estab. %   Immature Grans (Abs) 0.0 0.0 - 0.1 x10E3/uL  Comprehensive metabolic panel  Result Value Ref Range   Glucose 76 65 - 99 mg/dL   BUN 33 (H) 8 - 27 mg/dL   Creatinine, Ser 0.94 0.57 - 1.00 mg/dL   eGFR 66 >59 mL/min/1.73   BUN/Creatinine Ratio 35 (H) 12 - 28   Sodium 142 134 - 144 mmol/L   Potassium 4.3 3.5 - 5.2 mmol/L   Chloride 106 96 - 106 mmol/L   CO2 23 20 - 29 mmol/L   Calcium 9.1 8.7 - 10.3 mg/dL   Total Protein 6.2 6.0 - 8.5 g/dL   Albumin 4.0 3.8 - 4.8 g/dL   Globulin, Total 2.2 1.5 - 4.5 g/dL   Albumin/Globulin Ratio 1.8 1.2 - 2.2   Bilirubin Total 0.6 0.0 - 1.2 mg/dL   Alkaline Phosphatase 81 44 - 121 IU/L   AST 20 0 - 40 IU/L   ALT 16 0 - 32 IU/L  Lipid Panel w/o Chol/HDL Ratio  Result Value Ref Range   Cholesterol, Total 191 100 - 199 mg/dL   Triglycerides 96 0 - 149 mg/dL   HDL 77 >39 mg/dL   VLDL Cholesterol Cal 17 5 - 40 mg/dL   LDL Chol Calc (NIH) 97 0 - 99 mg/dL  Urinalysis, Routine w reflex microscopic  Result Value Ref Range   Specific Gravity, UA >1.030  (H) 1.005 - 1.030   pH, UA 6.0 5.0 - 7.5   Color, UA Yellow Yellow   Appearance Ur Clear Clear   Leukocytes,UA Negative Negative   Protein,UA Negative Negative/Trace   Glucose, UA Negative Negative   Ketones, UA Negative Negative   RBC, UA Negative Negative   Bilirubin, UA Negative Negative   Urobilinogen, Ur 4.0 (H) 0.2 - 1.0 mg/dL   Nitrite, UA Negative Negative   Microscopic Examination See below:   TSH  Result Value Ref Range   TSH 2.190 0.450 - 4.500 uIU/mL  Microalbumin, Urine Waived  Result Value Ref Range   Microalb, Ur Waived 30 (H) 0 - 19 mg/L   Creatinine, Urine Waived 200 10 - 300 mg/dL   Microalb/Creat Ratio <30 <30 mg/g      Assessment & Plan:   Problem List Items Addressed This Visit       Cardiovascular and Mediastinum   Aortic atherosclerosis ([place])    Checking labs today. Continue to monitor. Call with any concerns.       Relevant Medications   lisinopril (ZESTRIL) 10 MG tablet  Other Relevant Orders   Comprehensive metabolic panel   Lipid Panel w/o Chol/HDL Ratio     Genitourinary   Benign hypertensive renal disease    Under good control on current regimen. Continue current regimen. Continue to monitor. Call with any concerns. Refills given. Labs drawn today.       Relevant Orders   Comprehensive metabolic panel     Other   Iron deficiency anemia    Rechecking labs today. Await results. Treat as needed.       Relevant Orders   Comprehensive metabolic panel   CBC with Differential/Platelet   Binge eating disorder - Primary    Under good control on current regimen. Continue current regimen. Continue to monitor. Call with any concerns. Refills given for 3 months. Follow up 3 months.        Relevant Orders   Comprehensive metabolic panel   Depression, recurrent ([place])    Under good control on current regimen. Continue current regimen. Continue to monitor. Call with any concerns. Refills given.        Relevant Medications   FLUoxetine  (PROZAC) 20 MG capsule   Other Relevant Orders   Comprehensive metabolic panel   Other Visit Diagnoses     Acute pain of left knee       Suprapatellar efusion. Will treat with voltaren and obtain x-ray. Call with any concerns or if not improving.    Relevant Orders   DG Knee Complete 4 Views Left   Trochanteric bursitis of right hip       Injected today under semi-sterile conditions with 0.5cc triamcinalone and 3cc 1% lidocaine with good results. Call with any concerns.         Follow up plan: Return in about 3 months (around 03/19/2022).

## 2021-12-21 ENCOUNTER — Ambulatory Visit (INDEPENDENT_AMBULATORY_CARE_PROVIDER_SITE_OTHER): Payer: PPO | Admitting: *Deleted

## 2021-12-21 DIAGNOSIS — Z0000 Encounter for general adult medical examination without abnormal findings: Secondary | ICD-10-CM | POA: Diagnosis not present

## 2021-12-21 LAB — COMPREHENSIVE METABOLIC PANEL
ALT: 18 IU/L (ref 0–32)
AST: 22 IU/L (ref 0–40)
Albumin/Globulin Ratio: 2 (ref 1.2–2.2)
Albumin: 4.2 g/dL (ref 3.8–4.8)
Alkaline Phosphatase: 81 IU/L (ref 44–121)
BUN/Creatinine Ratio: 26 (ref 12–28)
BUN: 23 mg/dL (ref 8–27)
Bilirubin Total: 0.4 mg/dL (ref 0.0–1.2)
CO2: 23 mmol/L (ref 20–29)
Calcium: 9.1 mg/dL (ref 8.7–10.3)
Chloride: 103 mmol/L (ref 96–106)
Creatinine, Ser: 0.87 mg/dL (ref 0.57–1.00)
Globulin, Total: 2.1 g/dL (ref 1.5–4.5)
Glucose: 84 mg/dL (ref 70–99)
Potassium: 4.4 mmol/L (ref 3.5–5.2)
Sodium: 141 mmol/L (ref 134–144)
Total Protein: 6.3 g/dL (ref 6.0–8.5)
eGFR: 72 mL/min/{1.73_m2} (ref >59)

## 2021-12-21 LAB — CBC WITH DIFFERENTIAL/PLATELET
Basophils Absolute: 0 10*3/uL (ref 0.0–0.2)
Basos: 1 %
EOS (ABSOLUTE): 0.2 10*3/uL (ref 0.0–0.4)
Eos: 5 %
Hematocrit: 41.1 % (ref 34.0–46.6)
Hemoglobin: 13.6 g/dL (ref 11.1–15.9)
Immature Grans (Abs): 0 10*3/uL (ref 0.0–0.1)
Immature Granulocytes: 0 %
Lymphocytes Absolute: 0.8 10*3/uL (ref 0.7–3.1)
Lymphs: 21 %
MCH: 29.6 pg (ref 26.6–33.0)
MCHC: 33.1 g/dL (ref 31.5–35.7)
MCV: 89 fL (ref 79–97)
Monocytes Absolute: 0.4 10*3/uL (ref 0.1–0.9)
Monocytes: 10 %
Neutrophils Absolute: 2.3 10*3/uL (ref 1.4–7.0)
Neutrophils: 63 %
Platelets: 239 10*3/uL (ref 150–450)
RBC: 4.6 x10E6/uL (ref 3.77–5.28)
RDW: 12.3 % (ref 11.7–15.4)
WBC: 3.7 10*3/uL (ref 3.4–10.8)

## 2021-12-21 LAB — LIPID PANEL W/O CHOL/HDL RATIO
Cholesterol, Total: 183 mg/dL (ref 100–199)
HDL: 82 mg/dL (ref >39)
LDL Chol Calc (NIH): 91 mg/dL (ref 0–99)
Triglycerides: 50 mg/dL (ref 0–149)
VLDL Cholesterol Cal: 10 mg/dL (ref 5–40)

## 2021-12-21 NOTE — Patient Instructions (Signed)
Mckenzie Webster , Thank you for taking time to come for your Medicare Wellness Visit. I appreciate your ongoing commitment to your health goals. Please review the following plan we discussed and let me know if I can assist you in the future.   Screening recommendations/referrals: Colonoscopy: up to date  Mammogram: Education provided Bone Density: Education provided Recommended yearly ophthalmology/optometry visit for glaucoma screening and checkup Recommended yearly dental visit for hygiene and checkup  Vaccinations: Influenza vaccine: up to date Pneumococcal vaccine: up to date Tdap vaccine: up to date Shingles vaccine: up to date    Advanced directives: Education provided  Conditions/risks identified:   Next appointment: 03-20-2022 @ 8:40  [facility] 65 Years and Older, Female Preventive care refers to lifestyle choices and visits with your health care provider that can promote health and wellness. What does preventive care include? A yearly physical exam. This is also called an annual well check. Dental exams once or twice a year. Routine eye exams. Ask your health care provider how often you should have your eyes checked. Personal lifestyle choices, including: Daily care of your teeth and gums. Regular physical activity. Eating a healthy diet. Avoiding tobacco and drug use. Limiting alcohol use. Practicing safe sex. Taking low-dose aspirin every day. Taking vitamin and mineral supplements as recommended by your health care provider. What happens during an annual well check? The services and screenings done by your health care provider during your annual well check will depend on your age, overall health, lifestyle risk factors, and family history of disease. Counseling  Your health care provider may ask you questions about your: Alcohol use. Tobacco use. Drug use. Emotional well-being. Home and relationship well-being. Sexual activity. Eating  habits. History of falls. Memory and ability to understand (cognition). Work and work Statistician. Reproductive health. Screening  You may have the following tests or measurements: Height, weight, and BMI. Blood pressure. Lipid and cholesterol levels. These may be checked every 5 years, or more frequently if you are over 57 years old. Skin check. Lung cancer screening. You may have this screening every year starting at age 24 if you have a 30-pack-year history of smoking and currently smoke or have quit within the past 15 years. Fecal occult blood test (FOBT) of the stool. You may have this test every year starting at age 59. Flexible sigmoidoscopy or colonoscopy. You may have a sigmoidoscopy every 5 years or a colonoscopy every 10 years starting at age 25. Hepatitis C blood test. Hepatitis B blood test. Sexually transmitted disease (STD) testing. Diabetes screening. This is done by checking your blood sugar (glucose) after you have not eaten for a while (fasting). You may have this done every 1-3 years. Bone density scan. This is done to screen for osteoporosis. You may have this done starting at age 52. Mammogram. This may be done every 1-2 years. Talk to your health care provider about how often you should have regular mammograms. Talk with your health care provider about your test results, treatment options, and if necessary, the need for more tests. Vaccines  Your health care provider may recommend certain vaccines, such as: Influenza vaccine. This is recommended every year. Tetanus, diphtheria, and acellular pertussis (Tdap, Td) vaccine. You may need a Td booster every 10 years. Zoster vaccine. You may need this after age 82. Pneumococcal 13-valent conjugate (PCV13) vaccine. One dose is recommended after age 25. Pneumococcal polysaccharide (PPSV23) vaccine. One dose is recommended after age 67. Talk to your health care  provider about which screenings and vaccines you need and how  often you need them. This information is not intended to replace advice given to you by your health care provider. Make sure you discuss any questions you have with your health care provider. Document Released: 11/12/2015 Document Revised: 07/05/2016 Document Reviewed: 08/17/2015 Elsevier Interactive Patient Education  2017 [place] Prevention in the Home Falls can cause injuries. They can happen to people of all ages. There are many things you can do to make your home safe and to help prevent falls. What can I do on the outside of my home? Regularly fix the edges of walkways and driveways and fix any cracks. Remove anything that might make you trip as you walk through a door, such as a raised step or threshold. Trim any bushes or trees on the path to your home. Use bright outdoor lighting. Clear any walking paths of anything that might make someone trip, such as rocks or tools. Regularly check to see if handrails are loose or broken. Make sure that both sides of any steps have handrails. Any raised decks and porches should have guardrails on the edges. Have any leaves, snow, or ice cleared regularly. Use sand or salt on walking paths during winter. Clean up any spills in your garage right away. This includes oil or grease spills. What can I do in the bathroom? Use night lights. Install grab bars by the toilet and in the tub and shower. Do not use towel bars as grab bars. Use non-skid mats or decals in the tub or shower. If you need to sit down in the shower, use a plastic, non-slip stool. Keep the floor dry. Clean up any water that spills on the floor as soon as it happens. Remove soap buildup in the tub or shower regularly. Attach bath mats securely with double-sided non-slip rug tape. Do not have throw rugs and other things on the floor that can make you trip. What can I do in the bedroom? Use night lights. Make sure that you have a light by your bed that is easy to  reach. Do not use any sheets or blankets that are too big for your bed. They should not hang down onto the floor. Have a firm chair that has side arms. You can use this for support while you get dressed. Do not have throw rugs and other things on the floor that can make you trip. What can I do in the kitchen? Clean up any spills right away. Avoid walking on wet floors. Keep items that you use a lot in easy-to-reach places. If you need to reach something above you, use a strong step stool that has a grab bar. Keep electrical cords out of the way. Do not use floor polish or wax that makes floors slippery. If you must use wax, use non-skid floor wax. Do not have throw rugs and other things on the floor that can make you trip. What can I do with my stairs? Do not leave any items on the stairs. Make sure that there are handrails on both sides of the stairs and use them. Fix handrails that are broken or loose. Make sure that handrails are as long as the stairways. Check any carpeting to make sure that it is firmly attached to the stairs. Fix any carpet that is loose or worn. Avoid having throw rugs at the top or bottom of the stairs. If you do have throw rugs, attach them to the  floor with carpet tape. Make sure that you have a light switch at the top of the stairs and the bottom of the stairs. If you do not have them, ask someone to add them for you. What else can I do to help prevent falls? Wear shoes that: Do not have high heels. Have rubber bottoms. Are comfortable and fit you well. Are closed at the toe. Do not wear sandals. If you use a stepladder: Make sure that it is fully opened. Do not climb a closed stepladder. Make sure that both sides of the stepladder are locked into place. Ask someone to hold it for you, if possible. Clearly mark and make sure that you can see: Any grab bars or handrails. First and last steps. Where the edge of each step is. Use tools that help you move  around (mobility aids) if they are needed. These include: Canes. Walkers. Scooters. Crutches. Turn on the lights when you go into a dark area. Replace any light bulbs as soon as they burn out. Set up your furniture so you have a clear path. Avoid moving your furniture around. If any of your floors are uneven, fix them. If there are any pets around you, be aware of where they are. Review your medicines with your doctor. Some medicines can make you feel dizzy. This can increase your chance of falling. Ask your doctor what other things that you can do to help prevent falls. This information is not intended to replace advice given to you by your health care provider. Make sure you discuss any questions you have with your health care provider. Document Released: 08/12/2009 Document Revised: 03/23/2016 Document Reviewed: 11/20/2014 Elsevier Interactive Patient Education  2017 [employer].

## 2021-12-21 NOTE — Progress Notes (Signed)
Subjective:   Mckenzie Webster is a 71 y.o. female who presents for Medicare Annual (Subsequent) preventive examination.  I connected with  Mckenzie Webster on 12/21/21 by a telephone enabled telemedicine application and verified that I am speaking with the correct person using two identifiers.   I discussed the limitations of evaluation and management by telemedicine. The patient expressed understanding and agreed to proceed.  Patient location: home  Provider location:   Tele-Health not in office    Review of Systems     Cardiac Risk Factors include: advanced age (>49men, >33 women)     Objective:    Today's Vitals   There is no height or weight on file to calculate BMI.  Advanced Directives 12/21/2021 05/09/2021 12/04/2018 11/28/2017 04/27/2017 11/24/2016 04/07/2016  Does Patient Have a Medical Advance Directive? No No No Yes No No No  Does patient want to make changes to medical advance directive? - - - Yes (MAU/Ambulatory/Procedural Areas - Information given) - - -  Would patient like information on creating a medical advance directive? No - Patient declined - Yes (MAU/Ambulatory/Procedural Areas - Information given) - - Yes (MAU/Ambulatory/Procedural Areas - Information given) -  Pre-existing out of facility DNR order (yellow form or pink MOST form) - - - - Physician notified to receive inpatient order - -    Current Medications (verified) Outpatient Encounter Medications as of 12/21/2021  Medication Sig   albuterol (VENTOLIN HFA) 108 (90 Base) MCG/ACT inhaler Inhale 2 puffs into the lungs every 6 (six) hours as needed for wheezing or shortness of breath.   cetirizine (ZYRTEC) 10 MG tablet Take 10 mg by mouth daily.   famotidine (PEPCID) 20 MG tablet Take 1 tablet (20 mg total) by mouth 2 (two) times daily.   ferrous sulfate 325 (65 FE) MG EC tablet Take 1 tablet (325 mg total) by mouth daily.   FLUoxetine (PROZAC) 20 MG capsule Take 3 capsules (60 mg total) by mouth daily.    fluticasone (FLONASE) 50 MCG/ACT nasal spray Place 2 sprays into both nostrils daily.    hydroxychloroquine (PLAQUENIL) 200 MG tablet Take 200 mg by mouth 2 (two) times daily.   [START ON 02/18/2022] lisdexamfetamine (VYVANSE) 60 MG capsule Take 1 capsule (60 mg total) by mouth every morning.   [START ON 01/19/2022] lisdexamfetamine (VYVANSE) 60 MG capsule Take 1 capsule (60 mg total) by mouth every morning.   lisdexamfetamine (VYVANSE) 60 MG capsule Take 1 capsule (60 mg total) by mouth every morning.   lisinopril (ZESTRIL) 10 MG tablet Take 1 tablet (10 mg total) by mouth daily.   naproxen (NAPROXEN DR) 500 MG EC tablet Take 1 tablet (500 mg total) by mouth 2 (two) times daily with a meal.   omeprazole (PRILOSEC) 20 MG capsule Take 1 capsule (20 mg total) by mouth daily.   No facility-administered encounter medications on file as of 12/21/2021.    Allergies (verified) Patient has no known allergies.   History: Past Medical History:  Diagnosis Date   Anemia    Cataract    Dry eye syndrome    Positive colorectal cancer screening using Cologuard test 02/14/2017   Stomach ulcer    Past Surgical History:  Procedure Laterality Date   COLONOSCOPY WITH PROPOFOL N/A 04/27/2017   Procedure: COLONOSCOPY WITH PROPOFOL;  Surgeon: Jonathon Bellows, MD;  [facility]COPY;  Service: Endoscopy;  Laterality: N/A;   COLONOSCOPY WITH PROPOFOL N/A 05/09/2021   Procedure: COLONOSCOPY WITH PROPOFOL;  Surgeon: Jonathon Bellows, MD;  Location:  [facility] ENDOSCOPY;  Service: Gastroenterology;  Laterality: N/A;   EYE SURGERY     Cataract   FOOT SURGERY     Change the alignment of the toes to stop the formation of corns   gastric bypass  1981   Family History  Problem Relation Age of Onset   Heart disease Mother    COPD Mother    Heart disease Father    Hypertension Father    Heart disease Maternal Grandfather    Alzheimer's disease Paternal Grandmother    Breast cancer Neg Hx    Social History    Socioeconomic History   Marital status: Divorced    Spouse name: Not on file   Number of children: Not on file   Years of education: Not on file   Highest education level: Some college, no degree  Occupational History   Occupation: Tax inspector part time   Tobacco Use   Smoking status: Former    Types: Cigarettes    Quit date: 10/30/1989    Years since quitting: 32.1   Smokeless tobacco: Never  Vaping Use   Vaping Use: Never used  Substance and Sexual Activity   Alcohol use: Yes    Comment: on occasion   Drug use: No   Sexual activity: Not Currently  Other Topics Concern   Not on file  Social History Narrative   Not on file   Social Determinants of Health   Financial Resource Strain: Low Risk    Difficulty of Paying Living Expenses: Not hard at all  Food Insecurity: No Food Insecurity   Worried About Charity fundraiser in the Last Year: Never true   [place] in the Last Year: Never true  Transportation Needs: No Transportation Needs   Lack of Transportation (Medical): No   Lack of Transportation (Non-Medical): No  Physical Activity: Inactive   Days of Exercise per Week: 0 days   Minutes of Exercise per Session: 0 min  Stress: No Stress Concern Present   Feeling of Stress : Not at all  Social Connections: Moderately Isolated   Frequency of Communication with Friends and Family: More than three times a week   Frequency of Social Gatherings with Friends and Family: More than three times a week   Attends Religious Services: More than 4 times per year   Active Member of [employer] or Organizations: No   Attends Music therapist: Never   Marital Status: Divorced    Tobacco Counseling Counseling given: Not Answered   Clinical Intake:  Pre-visit preparation completed: No  Pain : No/denies pain     Nutritional Risks: None Diabetes: No  How often do you need to have someone help you when you read instructions, pamphlets, or other written materials  from your doctor or pharmacy?: 1 - Never  Diabetic? no  Interpreter Needed?: No  Information entered by :: Leroy Kennedy LPN   Activities of Daily Living In your present state of health, do you have any difficulty performing the following activities: 12/21/2021 06/10/2021  Hearing? N Y  Vision? N N  Difficulty concentrating or making decisions? N N  Walking or climbing stairs? N N  Dressing or bathing? N N  Doing errands, shopping? N N  Preparing Food and eating ? N -  Using the Toilet? N -  In the past six months, have you accidently leaked urine? N -  Do you have problems with loss of bowel control? N -  Managing your Medications? N -  Managing your Finances? N -  Housekeeping or managing your Housekeeping? N -  Some recent data might be hidden    Patient Care Team: Valerie Roys, DO as PCP - General (Family Medicine) Conception Chancy, DDS (Dentistry)  Indicate any recent Medical Services you may have received from other than [facility] providers in the past year (date may be approximate).     Assessment:   This is a routine wellness examination for [place].  Hearing/Vision screen Hearing Screening - Comments:: No trouble hearing Vision Screening - Comments:: Has had cataract surgery in past [facility]   Dietary issues and exercise activities discussed: Current Exercise Habits: The patient does not participate in regular exercise at present, Exercise limited by: None identified   Goals Addressed             This Visit's Progress    DIET - INCREASE WATER INTAKE   Not on track    Recommend drinking at least 6-8 glasses of water a day      Patient Stated       Keep drinking 6-8 ounces water daily        Depression Screen PHQ 2/9 Scores 12/21/2021 12/20/2021 09/12/2021 06/10/2021 03/03/2021 12/07/2020 09/14/2020  PHQ - 2 Score 0 0 0 0 0 0 0  PHQ- 9 Score 0 0 2 0 0 1 1    Fall Risk Fall Risk  12/21/2021 06/10/2021 04/08/2020 03/22/2020 09/09/2019  Falls in the past year?  1 0 0 0 0  Number falls in past yr: 0 0 - 0 0  Injury with Fall? 1 0 - 0 0  Risk for fall due to : - No Fall Risks - - -  Follow up Falls evaluation completed;Education provided;Falls prevention discussed Falls evaluation completed - - -    FALL RISK PREVENTION PERTAINING TO THE HOME:  Any stairs in or around the home? Yes  If so, are there any without handrails? No  Home free of loose throw rugs in walkways, pet beds, electrical cords, etc? Yes  Adequate lighting in your home to reduce risk of falls? Yes   ASSISTIVE DEVICES UTILIZED TO PREVENT FALLS:  Life alert? No  Use of a cane, walker or w/c? No  Grab bars in the bathroom? No  Shower chair or bench in shower? No  Elevated toilet seat or a handicapped toilet? Yes   TIMED UP AND GO:  Was the test performed? No .    Cognitive Function:  Normal cognitive status assessed by direct observation by this Nurse Health Advisor. No abnormalities found.       6CIT Screen 12/04/2018 11/28/2017 11/24/2016  What Year? 0 points 0 points 0 points  What month? 0 points 0 points 0 points  What time? 0 points 0 points 0 points  Count back from 20 0 points 0 points 0 points  Months in reverse 0 points 0 points 0 points  Repeat phrase 0 points 0 points 0 points  Total Score 0 0 0    Immunizations Immunization History  Administered Date(s) Administered   Fluad Quad(high Dose 65+) 08/15/2019, 09/14/2020, 09/12/2021   Influenza, High Dose Seasonal PF 08/17/2017, 07/31/2018   Influenza,inj,Quad PF,6+ Mos 11/24/2016   Influenza-Unspecified 08/17/2017, 07/31/2018   PFIZER(Purple Top)SARS-COV-2 Vaccination 12/15/2019, 01/13/2020   Pneumococcal Conjugate-13 11/24/2016   Pneumococcal Polysaccharide-23 11/28/2017   Tdap 02/16/2016   Zoster Recombinat (Shingrix) 02/14/2017, 08/17/2017   Zoster, Live 02/14/2017    TDAP status: Up to date  Flu Vaccine status: Up  to date  Pneumococcal vaccine status: Up to date  Covid-19 vaccine status:  Information provided on how to obtain vaccines.   Qualifies for Shingles Vaccine? No   Zostavax completed Yes   Shingrix Completed?: Yes  Screening Tests Health Maintenance  Topic Date Due   DEXA SCAN  01/11/2020   COVID-19 Vaccine (3 - Pfizer risk series) 01/05/2022 (Originally 02/10/2020)   MAMMOGRAM  05/18/2023   COLONOSCOPY (Pts 45-1yrs Insurance coverage will need to be confirmed)  05/09/2024   TETANUS/TDAP  02/15/2026   Pneumonia Vaccine 59+ Years old  Completed   INFLUENZA VACCINE  Completed   Hepatitis C Screening  Completed   Zoster Vaccines- Shingrix  Completed   HPV VACCINES  Aged Out    Health Maintenance  Health Maintenance Due  Topic Date Due   DEXA SCAN  01/11/2020    Colorectal cancer screening: Type of screening: Colonoscopy. Completed 2022. Repeat every 3 years  Mammogram status: Ordered  . Pt provided with contact info and advised to call to schedule appt.   Bone Density status: Ordered  . Pt provided with contact info and advised to call to schedule appt.  Lung Cancer Screening: (Low Dose CT Chest recommended if Age 73-80 years, 30 pack-year currently smoking OR have quit w/in 15years.) does not qualify.   Lung Cancer Screening Referral:   Additional Screening:  Hepatitis C Screening: does not qualify; Completed 2018  Vision Screening: Recommended annual ophthalmology exams for early detection of glaucoma and other disorders of the eye. Is the patient up to date with their annual eye exam?  No  Who is the provider or what is the name of the office in which the patient attends annual eye exams? [place] If pt is not established with a provider, would they like to be referred to a provider to establish care? No .   Dental Screening: Recommended annual dental exams for proper oral hygiene  Community Resource Referral / Chronic Care Management: CRR required this visit?  No   CCM required this visit?  No      Plan:     I have personally  reviewed and noted the following in the patients chart:   Medical and social history Use of alcohol, tobacco or illicit drugs  Current medications and supplements including opioid prescriptions.  Functional ability and status Nutritional status Physical activity Advanced directives List of other physicians Hospitalizations, surgeries, and ER visits in previous 12 months Vitals Screenings to include cognitive, depression, and falls Referrals and appointments  In addition, I have reviewed and discussed with patient certain preventive protocols, quality metrics, and best practice recommendations. A written personalized care plan for preventive services as well as general preventive health recommendations were provided to patient.     Leroy Kennedy, LPN   12/10/1733   Nurse Notes:

## 2021-12-22 ENCOUNTER — Other Ambulatory Visit: Payer: Self-pay

## 2021-12-22 ENCOUNTER — Ambulatory Visit
Admission: RE | Admit: 2021-12-22 | Discharge: 2021-12-22 | Disposition: A | Discharge status: Home / Self Care | Payer: PPO | Attending: Family Medicine | Admitting: Family Medicine

## 2021-12-22 ENCOUNTER — Ambulatory Visit
Admission: RE | Admit: 2021-12-22 | Discharge: 2021-12-22 | Disposition: A | Discharge status: Home / Self Care | Payer: PPO | Source: Ambulatory Visit | Attending: Family Medicine | Admitting: Family Medicine

## 2021-12-22 DIAGNOSIS — M25562 Pain in left knee: Secondary | ICD-10-CM | POA: Diagnosis not present

## 2021-12-22 DIAGNOSIS — M7989 Other specified soft tissue disorders: Secondary | ICD-10-CM | POA: Diagnosis not present

## 2021-12-24 ENCOUNTER — Other Ambulatory Visit: Payer: Self-pay

## 2021-12-24 ENCOUNTER — Encounter: Payer: Self-pay | Admitting: Emergency Medicine

## 2021-12-24 ENCOUNTER — Ambulatory Visit
Admission: EM | Admit: 2021-12-24 | Discharge: 2021-12-24 | Disposition: A | Discharge status: Home / Self Care | Payer: PPO | Attending: Physician Assistant | Admitting: Physician Assistant

## 2021-12-24 DIAGNOSIS — J069 Acute upper respiratory infection, unspecified: Secondary | ICD-10-CM | POA: Diagnosis not present

## 2021-12-24 LAB — POCT INFLUENZA A/B
Influenza A, POC: NEGATIVE
Influenza B, POC: NEGATIVE

## 2021-12-24 MED ORDER — PROMETHAZINE-DM 6.25-15 MG/5ML PO SYRP: 5.0000 mL | ORAL_SOLUTION | Freq: Four times a day (QID) | ORAL | 0 refills | Status: DC | PRN

## 2021-12-24 MED ORDER — BENZONATATE 100 MG PO CAPS: 100.0000 mg | ORAL_CAPSULE | Freq: Three times a day (TID) | ORAL | 0 refills | Status: DC

## 2021-12-24 NOTE — ED Triage Notes (Signed)
Pt here with cough, congestion, sore throat, headache and fever x 2 days.

## 2021-12-24 NOTE — Discharge Instructions (Addendum)
Can take tessalon pearls every 8 hours as needed for cough Can take cough syrup every 4-6 hours as needed for cough.  Recommend Mucinex. Take tylenol as needed for headache and body aches. Rest, drink plenty of fluids Return for evaluation if symptoms become worse or follow up with your primary care physician

## 2021-12-24 NOTE — ED Provider Notes (Signed)
Roderic Palau    CSN: [id number] Arrival date & time: 12/24/21  1254      History   Chief Complaint Chief Complaint  Patient presents with   Cough   Nasal Congestion   Sore Throat   Generalized Body Aches   Fever   Headache    HPI Mckenzie Webster is a 71 y.o. female.   Pt complains of congestion, cough, sore throat, body aches, HA, fever that started two days ago.  Reports subjective fever at home.  She denies shortness of breath, wheezing, n/v/d.  She reports trouble sleeping due to cough.  She has taken Aleve with some relief.  She has had her flu test.    Past Medical History:  Diagnosis Date   Anemia    Cataract    Dry eye syndrome    Positive colorectal cancer screening using Cologuard test 02/14/2017   Stomach ulcer     Patient Active Problem List   Diagnosis Date Noted   Aortic atherosclerosis ([place]) 01/19/2021   Benign hypertensive renal disease 12/07/2020   Lung nodule seen on imaging study 09/21/2020   Depression, recurrent ([place]) 03/24/2020   Drug rash 06/04/2019   Osteoporosis 11/28/2017   Binge eating disorder 01/04/2016   Iron deficiency anemia    GERD without esophagitis 01/29/2015    Past Surgical History:  Procedure Laterality Date   COLONOSCOPY WITH PROPOFOL N/A 04/27/2017   Procedure: COLONOSCOPY WITH PROPOFOL;  Surgeon: Jonathon Bellows, MD;  Location: [facility] ENDOSCOPY;  Service: Endoscopy;  Laterality: N/A;   COLONOSCOPY WITH PROPOFOL N/A 05/09/2021   Procedure: COLONOSCOPY WITH PROPOFOL;  Surgeon: Jonathon Bellows, MD;  Location: [facility] ENDOSCOPY;  Service: Gastroenterology;  Laterality: N/A;   EYE SURGERY     Cataract   FOOT SURGERY     Change the alignment of the toes to stop the formation of corns   gastric bypass  1981    OB History   No obstetric history on file.      Home Medications    Prior to Admission medications   Medication Sig Start Date End Date Taking? Authorizing Provider  benzonatate (TESSALON) 100 MG capsule Take 1  capsule (100 mg total) by mouth every 8 (eight) hours. 12/24/21  Yes Ward, Lenise Arena, PA-C  promethazine-dextromethorphan (PROMETHAZINE-DM) 6.25-15 MG/5ML syrup Take 5 mLs by mouth 4 (four) times daily as needed for cough. 12/24/21  Yes Ward, Lenise Arena, PA-C  albuterol (VENTOLIN HFA) 108 (90 Base) MCG/ACT inhaler Inhale 2 puffs into the lungs every 6 (six) hours as needed for wheezing or shortness of breath. 03/08/21   Johnson, Megan P, DO  cetirizine (ZYRTEC) 10 MG tablet Take 10 mg by mouth daily.    [provider]  famotidine (PEPCID) 20 MG tablet Take 1 tablet (20 mg total) by mouth 2 (two) times daily. 12/20/21   Johnson, Megan P, DO  ferrous sulfate 325 (65 FE) MG EC tablet Take 1 tablet (325 mg total) by mouth daily. 06/10/21   Johnson, Megan P, DO  FLUoxetine (PROZAC) 20 MG capsule Take 3 capsules (60 mg total) by mouth daily. 12/20/21   Johnson, Megan P, DO  fluticasone (FLONASE) 50 MCG/ACT nasal spray Place 2 sprays into both nostrils daily.     [provider]  hydroxychloroquine (PLAQUENIL) 200 MG tablet Take 200 mg by mouth 2 (two) times daily. 12/03/20   [provider]  lisdexamfetamine (VYVANSE) 60 MG capsule Take 1 capsule (60 mg total) by mouth every morning. 02/18/22 03/20/22  Johnson, Megan P, DO  lisdexamfetamine (VYVANSE) 60 MG capsule Take 1 capsule (60 mg total) by mouth every morning. 01/19/22 02/18/22  Park Liter P, DO  lisdexamfetamine (VYVANSE) 60 MG capsule Take 1 capsule (60 mg total) by mouth every morning. 12/20/21 01/19/22  Park Liter P, DO  lisinopril (ZESTRIL) 10 MG tablet Take 1 tablet (10 mg total) by mouth daily. 12/20/21   Park Liter P, DO  naproxen (NAPROXEN DR) 500 MG EC tablet Take 1 tablet (500 mg total) by mouth 2 (two) times daily with a meal. 04/13/21   Cannady, Jolene T, NP  omeprazole (PRILOSEC) 20 MG capsule Take 1 capsule (20 mg total) by mouth daily. 06/10/21   Valerie Roys, DO    Family History Family History  Problem  Relation Age of Onset   Heart disease Mother    COPD Mother    Heart disease Father    Hypertension Father    Heart disease Maternal Grandfather    Alzheimer's disease Paternal Grandmother    Breast cancer Neg Hx     Social History Social History   Tobacco Use   Smoking status: Former    Types: Cigarettes    Quit date: 10/30/1989    Years since quitting: 32.1   Smokeless tobacco: Never  Vaping Use   Vaping Use: Never used  Substance Use Topics   Alcohol use: Yes    Comment: on occasion   Drug use: No     Allergies   Patient has no known allergies.   Review of Systems Review of Systems  Constitutional:  Positive for chills and fever.  HENT:  Positive for congestion and sore throat. Negative for ear pain.   Eyes:  Negative for pain and visual disturbance.  Respiratory:  Positive for cough. Negative for shortness of breath and wheezing.   Cardiovascular:  Negative for chest pain and palpitations.  Gastrointestinal:  Negative for abdominal pain, diarrhea, nausea and vomiting.  Genitourinary:  Negative for dysuria and hematuria.  Musculoskeletal:  Positive for myalgias. Negative for arthralgias and back pain.  Skin:  Negative for color change and rash.  Neurological:  Negative for seizures and syncope.  All other systems reviewed and are negative.   Physical Exam Triage Vital Signs ED Triage Vitals  Enc Vitals Group     BP 12/24/21 1315 124/78     Pulse Rate 12/24/21 1315 84     Resp 12/24/21 1315 (!) 22     Temp 12/24/21 1315 100.2 F (37.9 C)     Temp Source 12/24/21 1315 Oral     SpO2 12/24/21 1315 97 %     Weight --      Height --      Head Circumference --      Peak Flow --      Pain Score 12/24/21 1316 5     Pain Loc --      Pain Edu? --      Excl. in [place]? --    No data found.  Updated Vital Signs BP 124/78    Pulse 84    Temp 100.2 F (37.9 C) (Oral)    Resp (!) 22    SpO2 97%   Visual Acuity Right Eye Distance:   Left Eye Distance:    Bilateral Distance:    Right Eye Near:   Left Eye Near:    Bilateral Near:     Physical Exam Vitals and nursing note reviewed.  Constitutional:      General:  She is not in acute distress.    Appearance: She is well-developed.  HENT:     Head: Normocephalic and atraumatic.  Eyes:     Conjunctiva/sclera: Conjunctivae normal.  Cardiovascular:     Rate and Rhythm: Normal rate and regular rhythm.     Heart sounds: No murmur heard. Pulmonary:     Effort: Pulmonary effort is normal. No respiratory distress.     Breath sounds: Normal breath sounds.  Abdominal:     Palpations: Abdomen is soft.     Tenderness: There is no abdominal tenderness.  Musculoskeletal:        General: No swelling.     Cervical back: Neck supple.  Skin:    General: Skin is warm and dry.     Capillary Refill: Capillary refill takes less than 2 seconds.  Neurological:     Mental Status: She is alert.  Psychiatric:        Mood and Affect: Mood normal.     UC Treatments / Results  Labs (all labs ordered are listed, but only abnormal results are displayed) Labs Reviewed  POCT INFLUENZA A/B    EKG   Radiology No results found.  Procedures Procedures (including critical care time)  Medications Ordered in UC Medications - No data to display  Initial Impression / Assessment and Plan / UC Course  I have reviewed the triage vital signs and the nursing notes.  Pertinent labs & imaging results that were available during my care of the patient were reviewed by me and considered in my medical decision making (see chart for details).     URI, flu negative.  Cough syrup and tessalon pearls prescribed.  Supportive care discussed.  Return precautions discussed.  Final Clinical Impressions(s) / UC Diagnoses   Final diagnoses:  Acute upper respiratory infection     Discharge Instructions      Can take tessalon pearls every 8 hours as needed for cough Can take cough syrup every 4-6 hours as needed  for cough.  Recommend Mucinex. Take tylenol as needed for headache and body aches. Rest, drink plenty of fluids Return for evaluation if symptoms become worse or follow up with your primary care physician      ED Prescriptions     Medication Sig Dispense Auth. Provider   promethazine-dextromethorphan (PROMETHAZINE-DM) 6.25-15 MG/5ML syrup Take 5 mLs by mouth 4 (four) times daily as needed for cough. 118 mL Ward, Janett Billow Z, PA-C   benzonatate (TESSALON) 100 MG capsule Take 1 capsule (100 mg total) by mouth every 8 (eight) hours. 21 capsule Ward, Lenise Arena, PA-C      PDMP not reviewed this encounter.   Ward, Lenise Arena, PA-C 12/24/21 1347

## 2021-12-26 ENCOUNTER — Encounter: Payer: Self-pay | Admitting: Family Medicine

## 2021-12-26 LAB — NOVEL CORONAVIRUS, NAA: SARS-CoV-2, NAA: NOT DETECTED

## 2022-01-02 NOTE — Telephone Encounter (Unsigned)
Copied from [place] [id number]. Topic: General - Other ?>> Jan 02, 2022  2:02 PM [place], [place] wrote: ?Reason for CRM: pt calling back trying to get status of auth on Vyvanse ?

## 2022-01-05 NOTE — Addendum Note (Signed)
Encounter addended by: Annie Paras on: 01/05/2022 9:34 AM  Actions taken: Letter saved

## 2022-01-06 ENCOUNTER — Telehealth: Payer: Self-pay | Admitting: Family Medicine

## 2022-01-06 NOTE — Telephone Encounter (Signed)
PA has been approved for Vyvanse  mg capsule  ?Reference key : [id number] ?Patient is aware copay is still about $100 for 30 day supply. Patient will call pharmacy to have filled.  ?

## 2022-01-06 NOTE — Telephone Encounter (Signed)
Copied from [place]. Topic: General - Other ?>> Jan 06, 2022 10:23 AM Tessa Lerner A wrote: ?Reason for CRM: The patient is calling to check on the status of the prior authorization for their lisdexamfetamine (VYVANSE) 60 MG capsule [[id number]] ? ?Please contact further when possible ?

## 2022-01-06 NOTE — Telephone Encounter (Signed)
Per CoverMyMeds medication has been approved, per pharmacy medication still needs authorization. Will call insurance to verify. Patient is aware.  ?

## 2022-03-20 ENCOUNTER — Encounter: Payer: Self-pay | Admitting: Family Medicine

## 2022-03-20 ENCOUNTER — Ambulatory Visit (INDEPENDENT_AMBULATORY_CARE_PROVIDER_SITE_OTHER): Payer: PPO | Admitting: Family Medicine

## 2022-03-20 VITALS — BP 116/69 | HR 68 | Temp 97.8°F | Wt 221.4 lb

## 2022-03-20 DIAGNOSIS — F5081 Binge eating disorder: Secondary | ICD-10-CM | POA: Diagnosis not present

## 2022-03-20 DIAGNOSIS — Z6833 Body mass index (BMI) 33.0-33.9, adult: Secondary | ICD-10-CM

## 2022-03-20 DIAGNOSIS — E6609 Other obesity due to excess calories: Secondary | ICD-10-CM | POA: Diagnosis not present

## 2022-03-20 DIAGNOSIS — F339 Major depressive disorder, recurrent, unspecified: Secondary | ICD-10-CM

## 2022-03-20 DIAGNOSIS — E663 Overweight: Secondary | ICD-10-CM | POA: Insufficient documentation

## 2022-03-20 MED ORDER — LISDEXAMFETAMINE DIMESYLATE 60 MG PO CAPS: 60.0000 mg | ORAL_CAPSULE | ORAL | 0 refills | Status: DC

## 2022-03-20 MED ORDER — LISDEXAMFETAMINE DIMESYLATE 60 MG PO CAPS
60.0000 mg | ORAL_CAPSULE | ORAL | 0 refills | Status: DC
Start: 2022-03-20 — End: 2022-04-10

## 2022-03-20 NOTE — Assessment & Plan Note (Signed)
Would like to come off her prozac as she doesn't feel like it's helping. Concern that it's helping, just not enough. Will try weaning off- if she starts feeling worse, will let us know. Continue to monitor.

## 2022-03-20 NOTE — Assessment & Plan Note (Signed)
Under good control on current regimen. Continue current regimen. Continue to monitor. Call with any concerns. Refills given for 3 months. Follow up 3 months.    

## 2022-03-20 NOTE — Progress Notes (Signed)
BP 116/69   Pulse 68   Temp 97.8 F (36.6 C)   Wt 221 lb 6.4 oz (100.4 kg)   SpO2 97%   BMI 33.91 kg/m    Subjective:    Patient ID: Mckenzie Webster, female    DOB: 1951-01-20, 71 y.o.   MRN: [id number]  HPI: Mckenzie Webster is a 71 y.o. 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 She has been noticing that she has been staying in bed for hours as she doesn't have any motivation. She would like to come off the prozac as she doesn't seem to think that it's working. She again notes that her vyvanse seems to be a better help for her mood. She feels like her vyvanse is helping her to avoid binging, but once it wears off she starts binging again.   DEPRESSION Mood status: exacerbated Satisfied with current treatment?: no Symptom severity: moderate  Duration of current treatment : years Side effects: no Medication compliance: good compliance Psychotherapy/counseling: no  Previous psychiatric medications: prozac Depressed mood: yes Anxious mood: yes Anhedonia: no Significant weight loss or gain: no Insomnia: no  Fatigue: yes Feelings of worthlessness or guilt: no Impaired concentration/indecisiveness: no Suicidal ideations: no Hopelessness: no Crying spells: no    03/20/2022    8:47 AM 12/21/2021    8:31 AM 12/20/2021   11:04 AM 09/12/2021    8:33 AM 06/10/2021    8:37 AM  Depression screen PHQ 2/9  Decreased Interest 2 0 0 0 0  Down, Depressed, Hopeless 2 0 0 0 0  PHQ - 2 Score 4 0 0 0 0  Altered sleeping 2 0 0 0 0  Tired, decreased energy 2 0 0 0 0  Change in appetite 3 0 0 2 0  Feeling bad or failure about yourself  0 0 0 0 0  Trouble concentrating 0 0 0 0 0  Moving slowly or fidgety/restless 0 0 0 0 0  Suicidal thoughts 0 0 0 0 0  PHQ-9 Score 11 0 0 2 0  Difficult doing work/chores  Not difficult at all Not difficult at all  Not difficult at all   OBESITY Duration: chronic Previous attempts  at weight loss: yes Complications of obesity: HTN, Depression, GERD Current weight: 221lbs Weight loss goal: 20lbs Weight loss to date: none Requesting obesity pharmacotherapy: yes Current weight loss supplements/medications: no   Relevant past medical, surgical, family and social history reviewed and updated as indicated. Interim medical history since our last visit reviewed. Allergies and medications reviewed and updated.  Review of Systems  Constitutional: Negative.   Respiratory: Negative.    Cardiovascular: Negative.   Gastrointestinal: Negative.   Musculoskeletal: Negative.   Skin: Negative.   Neurological: Negative.   Psychiatric/Behavioral:  Positive for decreased concentration and dysphoric mood. Negative for agitation, behavioral problems, confusion, hallucinations, self-injury, sleep disturbance and suicidal ideas. The patient is not nervous/anxious and is not hyperactive.    Per HPI unless specifically indicated above     Objective:    BP 116/69   Pulse 68   Temp 97.8 F (36.6 C)   Wt 221 lb 6.4 oz (100.4 kg)   SpO2 97%   BMI 33.91 kg/m   Wt Readings from Last 3 Encounters:  03/20/22 221 lb 6.4 oz (100.4 kg)  12/20/21 219 lb 3.2 oz (99.4 kg)  09/12/21 221 lb 9.6 oz (100.5 kg)  Physical Exam Vitals and nursing note reviewed.  Constitutional:      General: She is not in acute distress.    Appearance: Normal appearance. She is obese. She is not ill-appearing, toxic-appearing or diaphoretic.  HENT:     Head: Normocephalic and atraumatic.     Right Ear: External ear normal.     Left Ear: External ear normal.     Nose: Nose normal.     Mouth/Throat:     Mouth: Mucous membranes are moist.     Pharynx: Oropharynx is clear.  Eyes:     General: No scleral icterus.       Right eye: No discharge.        Left eye: No discharge.     Extraocular Movements: Extraocular movements intact.     Conjunctiva/sclera: Conjunctivae normal.     Pupils: Pupils are equal,  round, and reactive to light.  Cardiovascular:     Rate and Rhythm: Normal rate and regular rhythm.     Pulses: Normal pulses.     Heart sounds: Normal heart sounds. No murmur heard.   No friction rub. No gallop.  Pulmonary:     Effort: Pulmonary effort is normal. No respiratory distress.     Breath sounds: Normal breath sounds. No stridor. No wheezing, rhonchi or rales.  Chest:     Chest wall: No tenderness.  Musculoskeletal:        General: Normal range of motion.     Cervical back: Normal range of motion and neck supple.  Skin:    General: Skin is warm and dry.     Capillary Refill: Capillary refill takes less than 2 seconds.     Coloration: Skin is not jaundiced or pale.     Findings: No bruising, erythema, lesion or rash.  Neurological:     General: No focal deficit present.     Mental Status: She is alert and oriented to person, place, and time. Mental status is at baseline.  Psychiatric:        Mood and Affect: Mood normal.        Behavior: Behavior normal.        Thought Content: Thought content normal.        Judgment: Judgment normal.    Results for orders placed or performed during the hospital encounter of 12/24/21  Novel Coronavirus, NAA ([facility])   Specimen: Nasopharyngeal Swab; Nasopharyngeal(NP) swabs in vial transport medium   Nasopharynge  Patient  Result Value Ref Range   SARS-CoV-2, NAA Not Detected Not Detected  POCT Influenza A/B  Result Value Ref Range   Influenza A, POC Negative Negative   Influenza B, POC Negative Negative      Assessment & Plan:   Problem List Items Addressed This Visit       Other   Binge eating disorder    Under good control on current regimen. Continue current regimen. Continue to monitor. Call with any concerns. Refills given for 3 months. Follow up 3 months.         Depression, recurrent ([place]) - Primary    Would like to come off her prozac as she doesn't feel like it's helping. Concern that it's helping, just not  enough. Will try weaning off- if she starts feeling worse, will let us know. Continue to monitor.        Class 1 obesity due to excess calories with serious comorbidity and body mass index (BMI) of 33.0 to 33.9 in adult    Interested  in starting wegovy. Will check on insurance coverage. Await results.        Relevant Medications   lisdexamfetamine (VYVANSE) 60 MG capsule   lisdexamfetamine (VYVANSE) 60 MG capsule (Start on 04/19/2022)   lisdexamfetamine (VYVANSE) 60 MG capsule (Start on 05/19/2022)     Follow up plan: Return in about 3 months (around 06/20/2022) for physical.

## 2022-03-20 NOTE — Assessment & Plan Note (Signed)
Interested in starting wegovy. Will check on insurance coverage. Await results.

## 2022-03-20 NOTE — Patient Instructions (Addendum)
To come off the prozac: take '40mg'$  for 5 days, then 20 mg for 5 days, then stop   Call to check to see if Mancel Parsons is covered by your insurance.

## 2022-04-08 ENCOUNTER — Other Ambulatory Visit: Payer: Self-pay | Admitting: Family Medicine

## 2022-04-10 ENCOUNTER — Other Ambulatory Visit: Payer: Self-pay | Admitting: Family Medicine

## 2022-04-10 MED ORDER — LISDEXAMFETAMINE DIMESYLATE 60 MG PO CAPS
60.0000 mg | ORAL_CAPSULE | ORAL | 0 refills | Status: DC
Start: 2022-05-19 — End: 2022-06-16

## 2022-04-10 MED ORDER — LISDEXAMFETAMINE DIMESYLATE 60 MG PO CAPS: 60.0000 mg | ORAL_CAPSULE | ORAL | 0 refills | Status: DC

## 2022-04-10 MED ORDER — LISDEXAMFETAMINE DIMESYLATE 60 MG PO CAPS
60.0000 mg | ORAL_CAPSULE | ORAL | 0 refills | Status: DC
Start: 2022-04-19 — End: 2022-06-16

## 2022-04-10 NOTE — Telephone Encounter (Signed)
Pt calling b/c her Rx  lisdexamfetamine (VYVANSE) 60 MG capsule was sent to mail order - [facility] ([place], [place].  They do not fill that med for her.  Pt states she is out of medication and needs this sent to  [place], [place]

## 2022-04-10 NOTE — Telephone Encounter (Signed)
Requested medication (s) are due for refill today: Yes  Requested medication (s) are on the active medication list: Yes  Last refill:  03/20/22  Future visit scheduled: Yes  Notes to clinic:  See request.    Requested Prescriptions  Pending Prescriptions Disp Refills   VYVANSE 60 MG capsule [Pharmacy Med Name: VYVANSE 60 MG CAP] 30 capsule     Sig: TAKE 1 CAPSULE BY MOUTH EVERY MORNING     Not Delegated - Psychiatry:  Stimulants/ADHD Failed - 04/10/2022 10:32 AM      Failed - This refill cannot be delegated      Failed - Urine Drug Screen completed in last 360 days      Passed - Last BP in normal range    BP Readings from Last 1 Encounters:  03/20/22 116/69         Passed - Last Heart Rate in normal range    Pulse Readings from Last 1 Encounters:  03/20/22 68         Passed - Valid encounter within last 6 months    Recent Outpatient Visits           3 weeks ago Depression, recurrent ([address])   [place], Megan P, DO   3 months ago Binge eating disorder   [employer], Megan P, DO   7 months ago Binge eating disorder   [employer], Megan P, DO   10 months ago Routine general medical examination at a health care facility   [facility], [place], DO   1 year ago Binge eating disorder   [place], Megan P, DO       Future Appointments             In 2 months Johnson, Megan P, DO [place]             VYVANSE 60 MG capsule [employer] Med Name: VYVANSE 60 MG CAP] 30 capsule     Sig: TAKE 1 [place]     Not Delegated - Psychiatry:  Stimulants/ADHD Failed - 04/10/2022 10:32 AM      Failed - This refill cannot be delegated      Failed - Urine Drug Screen completed in last 360 days      Passed - Last BP in normal range    BP Readings from Last 1 Encounters:  03/20/22 116/69         Passed - Last Heart Rate in  normal range    Pulse Readings from Last 1 Encounters:  03/20/22 68         Passed - Valid encounter within last 6 months    Recent Outpatient Visits           3 weeks ago Depression, recurrent ([place])   [place], Megan P, DO   3 months ago Binge eating disorder   [employer], Megan P, DO   7 months ago Binge eating disorder   [employer], Megan P, DO   10 months ago Routine general medical examination at a health care facility   [place], [place], DO   1 year ago Binge eating disorder   [place], DO       Future Appointments             In 2 months Johnson, Megan P, DO  [place]

## 2022-04-11 NOTE — Telephone Encounter (Signed)
Prescriptions are at the pharmacy. Please refuse.

## 2022-04-27 ENCOUNTER — Encounter: Payer: Self-pay | Admitting: Family Medicine

## 2022-06-09 ENCOUNTER — Encounter: Payer: Self-pay | Admitting: Family Medicine

## 2022-06-16 MED ORDER — LISDEXAMFETAMINE DIMESYLATE 50 MG PO CAPS: 50.0000 mg | ORAL_CAPSULE | ORAL | 0 refills | Status: DC

## 2022-06-16 NOTE — Telephone Encounter (Signed)
Patient would like to have the 50g that is teed up for your signature

## 2022-06-20 ENCOUNTER — Encounter: Payer: Self-pay | Admitting: Family Medicine

## 2022-06-20 ENCOUNTER — Ambulatory Visit (INDEPENDENT_AMBULATORY_CARE_PROVIDER_SITE_OTHER): Payer: PPO | Admitting: Family Medicine

## 2022-06-20 VITALS — BP 120/79 | HR 67 | Temp 98.0°F | Wt 222.5 lb

## 2022-06-20 DIAGNOSIS — Z13820 Encounter for screening for osteoporosis: Secondary | ICD-10-CM

## 2022-06-20 DIAGNOSIS — F5081 Binge eating disorder: Secondary | ICD-10-CM

## 2022-06-20 DIAGNOSIS — R2689 Other abnormalities of gait and mobility: Secondary | ICD-10-CM

## 2022-06-20 DIAGNOSIS — D692 Other nonthrombocytopenic purpura: Secondary | ICD-10-CM | POA: Diagnosis not present

## 2022-06-20 DIAGNOSIS — I700 Atherosclerosis of aorta: Secondary | ICD-10-CM | POA: Diagnosis not present

## 2022-06-20 DIAGNOSIS — D509 Iron deficiency anemia, unspecified: Secondary | ICD-10-CM | POA: Diagnosis not present

## 2022-06-20 DIAGNOSIS — Z0000 Encounter for general adult medical examination without abnormal findings: Secondary | ICD-10-CM | POA: Diagnosis not present

## 2022-06-20 DIAGNOSIS — Z1231 Encounter for screening mammogram for malignant neoplasm of breast: Secondary | ICD-10-CM

## 2022-06-20 DIAGNOSIS — I129 Hypertensive chronic kidney disease with stage 1 through stage 4 chronic kidney disease, or unspecified chronic kidney disease: Secondary | ICD-10-CM | POA: Diagnosis not present

## 2022-06-20 DIAGNOSIS — F339 Major depressive disorder, recurrent, unspecified: Secondary | ICD-10-CM | POA: Diagnosis not present

## 2022-06-20 LAB — URINALYSIS, ROUTINE W REFLEX MICROSCOPIC
Bilirubin, UA: NEGATIVE
Glucose, UA: NEGATIVE
Ketones, UA: NEGATIVE
Leukocytes,UA: NEGATIVE
Nitrite, UA: NEGATIVE
Protein,UA: NEGATIVE
RBC, UA: NEGATIVE
Specific Gravity, UA: >1.03 — ABNORMAL HIGH (ref 1.005–1.030)
Urobilinogen, Ur: 0.2 mg/dL (ref 0.2–1.0)
pH, UA: 5.5 (ref 5.0–7.5)

## 2022-06-20 LAB — MICROALBUMIN, URINE WAIVED
Creatinine, Urine Waived: 200 mg/dL (ref 10–300)
Microalb, Ur Waived: 30 mg/L — ABNORMAL HIGH (ref 0–19)
Microalb/Creat Ratio: <30 mg/g (ref <30)

## 2022-06-20 NOTE — Patient Instructions (Addendum)
Please call to schedule your mammogram and bone density: [facility]: [id number], [place], [place] [address] Phone: [phone]  [facility] [address]. [place],  [place]  [address] Phone: [phone]

## 2022-06-20 NOTE — Progress Notes (Signed)
BP 120/79   Pulse 67   Temp 98 F (36.7 C)   Wt 222 lb 8 oz (100.9 kg)   SpO2 98%   BMI 34.08 kg/m    Subjective:    Patient ID: Mckenzie Webster, female    DOB: 08-Nov-1950, 71 y.o.   MRN: [id number]  HPI: Mckenzie Webster is a 71 y.o. female presenting on 06/20/2022 for comprehensive medical examination. Current medical complaints include:  Binge eating:   HYPERTENSION Hypertension status: controlled  Satisfied with current treatment? yes Duration of hypertension: chronic BP monitoring frequency:  not checking BP medication side effects:  no Medication compliance: excellent compliance Previous BP meds:lisinopril Aspirin: no Recurrent headaches: no Visual changes: no Palpitations: no Dyspnea: no Chest pain: no Lower extremity edema: no Dizzy/lightheaded: no  DEPRESSION- stopped her prozac, feeling much better Mood status: better Satisfied with current treatment?: no Symptom severity: mild  Duration of current treatment : chronic Side effects: no Medication compliance: stopped medicine and feeling better Psychotherapy/counseling: no  Previous psychiatric medications: prozac Depressed mood: no Anxious mood: no Anhedonia: no Significant weight loss or gain: no Insomnia: no  Fatigue: no Feelings of worthlessness or guilt: no Impaired concentration/indecisiveness: no Suicidal ideations: no Hopelessness: no Crying spells: no    06/20/2022    9:18 AM 03/20/2022    8:47 AM 12/21/2021    8:31 AM 12/20/2021   11:04 AM 09/12/2021    8:33 AM  Depression screen PHQ 2/9  Decreased Interest 0 2 0 0 0  Down, Depressed, Hopeless 0 2 0 0 0  PHQ - 2 Score 0 4 0 0 0  Altered sleeping 0 2 0 0 0  Tired, decreased energy 0 2 0 0 0  Change in appetite 0 3 0 0 2  Feeling bad or failure about yourself  0 0 0 0 0  Trouble concentrating 0 0 0 0 0  Moving slowly or fidgety/restless 0 0 0 0 0  Suicidal thoughts 0 0 0 0 0  PHQ-9 Score 0 11 0 0 2  Difficult doing work/chores Not  difficult at all  Not difficult at all Not difficult at all    Menopausal Symptoms: no  Depression Screen done today and results listed below:     06/20/2022    9:18 AM 03/20/2022    8:47 AM 12/21/2021    8:31 AM 12/20/2021   11:04 AM 09/12/2021    8:33 AM  Depression screen PHQ 2/9  Decreased Interest 0 2 0 0 0  Down, Depressed, Hopeless 0 2 0 0 0  PHQ - 2 Score 0 4 0 0 0  Altered sleeping 0 2 0 0 0  Tired, decreased energy 0 2 0 0 0  Change in appetite 0 3 0 0 2  Feeling bad or failure about yourself  0 0 0 0 0  Trouble concentrating 0 0 0 0 0  Moving slowly or fidgety/restless 0 0 0 0 0  Suicidal thoughts 0 0 0 0 0  PHQ-9 Score 0 11 0 0 2  Difficult doing work/chores Not difficult at all  Not difficult at all Not difficult at all    Past Medical History:  Past Medical History:  Diagnosis Date   Anemia    Cataract    Dry eye syndrome    Positive colorectal cancer screening using Cologuard test 02/14/2017   Stomach ulcer     Surgical History:  Past Surgical History:  Procedure Laterality Date   COLONOSCOPY WITH PROPOFOL  N/A 04/27/2017   Procedure: COLONOSCOPY WITH PROPOFOL;  Surgeon: Jonathon Bellows, MD;  Location[facility] ENDOSCOPY;  Service: Endoscopy;  Laterality: N/A;   COLONOSCOPY WITH PROPOFOL N/A 05/09/2021   Procedure: COLONOSCOPY WITH PROPOFOL;  Surgeon: Jonathon Bellows, MD;  Location: [facility] ENDOSCOPY;  Service: Gastroenterology;  Laterality: N/A;   EYE SURGERY     Cataract   FOOT SURGERY     Change the alignment of the toes to stop the formation of corns   gastric bypass  1981    Medications:  Current Outpatient Medications on File Prior to Visit  Medication Sig   cetirizine (ZYRTEC) 10 MG tablet Take 10 mg by mouth daily.   fluticasone (FLONASE) 50 MCG/ACT nasal spray Place 2 sprays into both nostrils daily.    hydroxychloroquine (PLAQUENIL) 200 MG tablet Take 200 mg by mouth 2 (two) times daily.   lisdexamfetamine (VYVANSE) 50 MG capsule Take 1 capsule (50 mg total)  by mouth every morning.   [START ON 07/16/2022] lisdexamfetamine (VYVANSE) 50 MG capsule Take 1 capsule (50 mg total) by mouth every morning.   [START ON 08/15/2022] lisdexamfetamine (VYVANSE) 50 MG capsule Take 1 capsule (50 mg total) by mouth every morning.   No current facility-administered medications on file prior to visit.    Allergies:  No Known Allergies  Social History:  Social History   Socioeconomic History   Marital status: Divorced    Spouse name: Not on file   Number of children: Not on file   Years of education: Not on file   Highest education level: Some college, no degree  Occupational History   Occupation: Tax inspector part time   Tobacco Use   Smoking status: Former    Types: Cigarettes    Quit date: 10/30/1989    Years since quitting: 32.6   Smokeless tobacco: Never  Vaping Use   Vaping Use: Never used  Substance and Sexual Activity   Alcohol use: Yes    Comment: on occasion   Drug use: No   Sexual activity: Not Currently  Other Topics Concern   Not on file  Social History Narrative   Not on file   Social Determinants of Health   Financial Resource Strain: Low Risk  (12/21/2021)   Overall Financial Resource Strain (CARDIA)    Difficulty of Paying Living Expenses: Not hard at all  Food Insecurity: No Food Insecurity (12/21/2021)   Hunger Vital Sign    Worried About Running Out of Food in the Last Year: Never true    [place] in the Last Year: Never true  Transportation Needs: No Transportation Needs (12/21/2021)   PRAPARE - Hydrologist (Medical): No    Lack of Transportation (Non-Medical): No  Physical Activity: Inactive (12/21/2021)   Exercise Vital Sign    Days of Exercise per Week: 0 days    Minutes of Exercise per Session: 0 min  Stress: No Stress Concern Present (12/21/2021)   Pillow    Feeling of Stress : Not at all  Social Connections:  Moderately Isolated (12/21/2021)   Social Connection and Isolation Panel [NHANES]    Frequency of Communication with Friends and Family: More than three times a week    Frequency of Social Gatherings with Friends and Family: More than three times a week    Attends Religious Services: More than 4 times per year    Active Member of Clubs or Organizations: No  Attends Archivist Meetings: Never    Marital Status: Divorced  Human resources officer Violence: Not At Risk (12/21/2021)   Humiliation, Afraid, Rape, and Kick questionnaire    Fear of Current or Ex-Partner: No    Emotionally Abused: No    Physically Abused: No    Sexually Abused: No   Social History   Tobacco Use  Smoking Status Former   Types: Cigarettes   Quit date: 10/30/1989   Years since quitting: 32.6  Smokeless Tobacco Never   Social History   Substance and Sexual Activity  Alcohol Use Yes   Comment: on occasion    Family History:  Family History  Problem Relation Age of Onset   Heart disease Mother    COPD Mother    Heart disease Father    Hypertension Father    Heart disease Maternal Grandfather    Alzheimer's disease Paternal Grandmother    Breast cancer Neg Hx     Past medical history, surgical history, medications, allergies, family history and social history reviewed with patient today and changes made to appropriate areas of the chart.   Review of Systems  Constitutional: Negative.   HENT:  Positive for hearing loss. Negative for congestion, ear discharge, ear pain, nosebleeds, sinus pain, sore throat and tinnitus.        Drainage, unpleasnt taste/smell to mucous  Eyes: Negative.   Respiratory:  Positive for cough and wheezing. Negative for hemoptysis, sputum production, shortness of breath and stridor.   Cardiovascular: Negative.   Gastrointestinal: Negative.   Genitourinary: Negative.   Musculoskeletal: Negative.   Skin:  Positive for rash. Negative for itching.  Neurological: Negative.         Issues with balance  Endo/Heme/Allergies:  Positive for environmental allergies. Negative for polydipsia. Bruises/bleeds easily.  Psychiatric/Behavioral: Negative.     All other ROS negative except what is listed above and in the HPI.      Objective:    BP 120/79   Pulse 67   Temp 98 F (36.7 C)   Wt 222 lb 8 oz (100.9 kg)   SpO2 98%   BMI 34.08 kg/m   Wt Readings from Last 3 Encounters:  06/20/22 222 lb 8 oz (100.9 kg)  03/20/22 221 lb 6.4 oz (100.4 kg)  12/20/21 219 lb 3.2 oz (99.4 kg)    Physical Exam Vitals and nursing note reviewed.  Constitutional:      General: She is not in acute distress.    Appearance: Normal appearance. She is obese. She is not ill-appearing, toxic-appearing or diaphoretic.  HENT:     Head: Normocephalic and atraumatic.     Right Ear: Tympanic membrane, ear canal and external ear normal. There is no impacted cerumen.     Left Ear: Tympanic membrane, ear canal and external ear normal. There is no impacted cerumen.     Nose: Nose normal. No congestion or rhinorrhea.     Mouth/Throat:     Mouth: Mucous membranes are moist.     Pharynx: Oropharynx is clear. No oropharyngeal exudate or posterior oropharyngeal erythema.  Eyes:     General: No scleral icterus.       Right eye: No discharge.        Left eye: No discharge.     Extraocular Movements: Extraocular movements intact.     Conjunctiva/sclera: Conjunctivae normal.     Pupils: Pupils are equal, round, and reactive to light.  Neck:     Vascular: No carotid bruit.  Cardiovascular:  Rate and Rhythm: Normal rate and regular rhythm.     Pulses: Normal pulses.     Heart sounds: No murmur heard.    No friction rub. No gallop.  Pulmonary:     Effort: Pulmonary effort is normal. No respiratory distress.     Breath sounds: Normal breath sounds. No stridor. No wheezing, rhonchi or rales.  Chest:     Chest wall: No tenderness.  Abdominal:     General: Abdomen is flat. Bowel sounds are  normal. There is no distension.     Palpations: Abdomen is soft. There is no mass.     Tenderness: There is no abdominal tenderness. There is no right CVA tenderness, left CVA tenderness, guarding or rebound.     Hernia: No hernia is present.  Genitourinary:    Comments: Breast and pelvic exams deferred with shared decision making Musculoskeletal:        General: No swelling, tenderness, deformity or signs of injury.     Cervical back: Normal range of motion and neck supple. No rigidity. No muscular tenderness.     Right lower leg: No edema.     Left lower leg: No edema.  Lymphadenopathy:     Cervical: No cervical adenopathy.  Skin:    General: Skin is warm and dry.     Capillary Refill: Capillary refill takes less than 2 seconds.     Coloration: Skin is not jaundiced or pale.     Findings: No bruising, erythema, lesion or rash.  Neurological:     General: No focal deficit present.     Mental Status: She is alert and oriented to person, place, and time. Mental status is at baseline.     Cranial Nerves: No cranial nerve deficit.     Sensory: No sensory deficit.     Motor: No weakness.     Coordination: Coordination normal.     Gait: Gait normal.     Deep Tendon Reflexes: Reflexes normal.  Psychiatric:        Mood and Affect: Mood normal.        Behavior: Behavior normal.        Thought Content: Thought content normal.        Judgment: Judgment normal.     Results for orders placed or performed in visit on 06/20/22  CBC with Differential/Platelet  Result Value Ref Range   WBC 5.4 3.4 - 10.8 x10E3/uL   RBC 4.53 3.77 - 5.28 x10E6/uL   Hemoglobin 13.2 11.1 - 15.9 g/dL   Hematocrit 41.1 34.0 - 46.6 %   MCV 91 79 - 97 fL   MCH 29.1 26.6 - 33.0 pg   MCHC 32.1 31.5 - 35.7 g/dL   RDW 13.2 11.7 - 15.4 %   Platelets 271 150 - 450 x10E3/uL   Neutrophils 69 Not Estab. %   Lymphs 17 Not Estab. %   Monocytes 8 Not Estab. %   Eos 5 Not Estab. %   Basos 1 Not Estab. %   Neutrophils  Absolute 3.7 1.4 - 7.0 x10E3/uL   Lymphocytes Absolute 0.9 0.7 - 3.1 x10E3/uL   Monocytes Absolute 0.4 0.1 - 0.9 x10E3/uL   EOS (ABSOLUTE) 0.3 0.0 - 0.4 x10E3/uL   Basophils Absolute 0.0 0.0 - 0.2 x10E3/uL   Immature Granulocytes 0 Not Estab. %   Immature Grans (Abs) 0.0 0.0 - 0.1 x10E3/uL  Comprehensive metabolic panel  Result Value Ref Range   Glucose 91 70 - 99 mg/dL   BUN 21 8 -  27 mg/dL   Creatinine, Ser 0.95 0.57 - 1.00 mg/dL   eGFR 64 >59 mL/min/1.73   BUN/Creatinine Ratio 22 12 - 28   Sodium 142 134 - 144 mmol/L   Potassium 4.5 3.5 - 5.2 mmol/L   Chloride 107 (H) 96 - 106 mmol/L   CO2 22 20 - 29 mmol/L   Calcium 9.1 8.7 - 10.3 mg/dL   Total Protein 6.4 6.0 - 8.5 g/dL   Albumin 4.1 3.9 - 4.9 g/dL   Globulin, Total 2.3 1.5 - 4.5 g/dL   Albumin/Globulin Ratio 1.8 1.2 - 2.2   Bilirubin Total 0.4 0.0 - 1.2 mg/dL   Alkaline Phosphatase 101 44 - 121 IU/L   AST 22 0 - 40 IU/L   ALT 16 0 - 32 IU/L  Lipid Panel w/o Chol/HDL Ratio  Result Value Ref Range   Cholesterol, Total 195 100 - 199 mg/dL   Triglycerides 61 0 - 149 mg/dL   HDL 79 >39 mg/dL   VLDL Cholesterol Cal 11 5 - 40 mg/dL   LDL Chol Calc (NIH) 105 (H) 0 - 99 mg/dL  Urinalysis, Routine w reflex microscopic  Result Value Ref Range   Specific Gravity, UA >1.030 (H) 1.005 - 1.030   pH, UA 5.5 5.0 - 7.5   Color, UA Yellow Yellow   Appearance Ur Clear Clear   Leukocytes,UA Negative Negative   Protein,UA Negative Negative/Trace   Glucose, UA Negative Negative   Ketones, UA Negative Negative   RBC, UA Negative Negative   Bilirubin, UA Negative Negative   Urobilinogen, Ur 0.2 0.2 - 1.0 mg/dL   Nitrite, UA Negative Negative  TSH  Result Value Ref Range   TSH 4.070 0.450 - 4.500 uIU/mL  Microalbumin, Urine Waived  Result Value Ref Range   Microalb, Ur Waived 30 (H) 0 - 19 mg/L   Creatinine, Urine Waived 200 10 - 300 mg/dL   Microalb/Creat Ratio <30 <30 mg/g      Assessment & Plan:   Problem List Items  Addressed This Visit       Cardiovascular and Mediastinum   Aortic atherosclerosis ([place])    Will keep BP and cholesterol under good control. Continue to monitor.       Relevant Medications   lisinopril (ZESTRIL) 10 MG tablet   Other Relevant Orders   CBC with Differential/Platelet (Completed)   Comprehensive metabolic panel (Completed)   Lipid Panel w/o Chol/HDL Ratio (Completed)   Senile purpura ([place])    Reassured patient. Continue to monitor.       Relevant Medications   lisinopril (ZESTRIL) 10 MG tablet     Genitourinary   Benign hypertensive renal disease    Under good control on current regimen. Continue current regimen. Continue to monitor. Call with any concerns. Refills given. Labs drawn today.        Relevant Orders   CBC with Differential/Platelet (Completed)   Comprehensive metabolic panel (Completed)   Urinalysis, Routine w reflex microscopic (Completed)   TSH (Completed)   Microalbumin, Urine Waived (Completed)     Other   Iron deficiency anemia    Rechecking labs today. Await results. Treat as needed.       Relevant Medications   ferrous sulfate 325 (65 FE) MG EC tablet   Other Relevant Orders   CBC with Differential/Platelet (Completed)   Comprehensive metabolic panel (Completed)   Binge eating disorder    Doing well. Vyvanse 60 not available. Has been taking vyvanse 50. Continue current regimen. Continue to monitor.  Refills up to date      Relevant Orders   CBC with Differential/Platelet (Completed)   Comprehensive metabolic panel (Completed)   Depression, recurrent ([place])    Doing well off medicine. Continue to monitor. Call with any concerns.       Relevant Orders   CBC with Differential/Platelet (Completed)   Comprehensive metabolic panel (Completed)   TSH (Completed)   Other Visit Diagnoses     Routine general medical examination at a health care facility    -  Primary   Vaccines up to date. Screening labs checked today. DEXA and  mammogram ordered today. Colonoscopy up to date. Continue diet and exercise. Call with any concerns.   Balance problem       Referral to PT discussed. She would like to hold off for now.    Encounter for screening mammogram for malignant neoplasm of breast       Mammogram ordered today.   Relevant Orders   MM 3D SCREEN BREAST BILATERAL   Screening for osteoporosis       DEXA ordered today.   Relevant Orders   DG Bone Density        Follow up plan: Return for 3 months before 11/16.   LABORATORY TESTING:  - Pap smear: not applicable  IMMUNIZATIONS:   - Tdap: Tetanus vaccination status reviewed: last tetanus booster within 10 years. - Influenza: Postponed to flu season - Pneumovax: Up to date - Prevnar: Up to date - COVID: Up to date - HPV: Not applicable - Shingrix vaccine: Up to date  SCREENING: -Mammogram: Ordered today  - Colonoscopy: Not applicable  - Bone Density: Ordered today   PATIENT COUNSELING:   Advised to take 1 mg of folate supplement per day if capable of pregnancy.   Sexuality: Discussed sexually transmitted diseases, partner selection, use of condoms, avoidance of unintended pregnancy  and contraceptive alternatives.   Advised to avoid cigarette smoking.  I discussed with the patient that most people either abstain from alcohol or drink within safe limits (<=14/week and <=4 drinks/occasion for males, <=7/weeks and <= 3 drinks/occasion for females) and that the risk for alcohol disorders and other health effects rises proportionally with the number of drinks per week and how often a drinker exceeds daily limits.  Discussed cessation/primary prevention of drug use and availability of treatment for abuse.   Diet: Encouraged to adjust caloric intake to maintain  or achieve ideal body weight, to reduce intake of dietary saturated fat and total fat, to limit sodium intake by avoiding high sodium foods and not adding table salt, and to maintain adequate dietary  potassium and calcium preferably from fresh fruits, vegetables, and low-fat dairy products.    stressed the importance of regular exercise  Injury prevention: Discussed safety belts, safety helmets, smoke detector, smoking near bedding or upholstery.   Dental health: Discussed importance of regular tooth brushing, flossing, and dental visits.    NEXT PREVENTATIVE PHYSICAL DUE IN 1 YEAR. Return for 3 months before 11/16.

## 2022-06-21 LAB — CBC WITH DIFFERENTIAL/PLATELET
Basophils Absolute: 0 10*3/uL (ref 0.0–0.2)
Basos: 1 %
EOS (ABSOLUTE): 0.3 10*3/uL (ref 0.0–0.4)
Eos: 5 %
Hematocrit: 41.1 % (ref 34.0–46.6)
Hemoglobin: 13.2 g/dL (ref 11.1–15.9)
Immature Grans (Abs): 0 10*3/uL (ref 0.0–0.1)
Immature Granulocytes: 0 %
Lymphocytes Absolute: 0.9 10*3/uL (ref 0.7–3.1)
Lymphs: 17 %
MCH: 29.1 pg (ref 26.6–33.0)
MCHC: 32.1 g/dL (ref 31.5–35.7)
MCV: 91 fL (ref 79–97)
Monocytes Absolute: 0.4 10*3/uL (ref 0.1–0.9)
Monocytes: 8 %
Neutrophils Absolute: 3.7 10*3/uL (ref 1.4–7.0)
Neutrophils: 69 %
Platelets: 271 10*3/uL (ref 150–450)
RBC: 4.53 x10E6/uL (ref 3.77–5.28)
RDW: 13.2 % (ref 11.7–15.4)
WBC: 5.4 10*3/uL (ref 3.4–10.8)

## 2022-06-21 LAB — COMPREHENSIVE METABOLIC PANEL
ALT: 16 IU/L (ref 0–32)
AST: 22 IU/L (ref 0–40)
Albumin/Globulin Ratio: 1.8 (ref 1.2–2.2)
Albumin: 4.1 g/dL (ref 3.9–4.9)
Alkaline Phosphatase: 101 IU/L (ref 44–121)
BUN/Creatinine Ratio: 22 (ref 12–28)
BUN: 21 mg/dL (ref 8–27)
Bilirubin Total: 0.4 mg/dL (ref 0.0–1.2)
CO2: 22 mmol/L (ref 20–29)
Calcium: 9.1 mg/dL (ref 8.7–10.3)
Chloride: 107 mmol/L — ABNORMAL HIGH (ref 96–106)
Creatinine, Ser: 0.95 mg/dL (ref 0.57–1.00)
Globulin, Total: 2.3 g/dL (ref 1.5–4.5)
Glucose: 91 mg/dL (ref 70–99)
Potassium: 4.5 mmol/L (ref 3.5–5.2)
Sodium: 142 mmol/L (ref 134–144)
Total Protein: 6.4 g/dL (ref 6.0–8.5)
eGFR: 64 mL/min/{1.73_m2} (ref >59)

## 2022-06-21 LAB — LIPID PANEL W/O CHOL/HDL RATIO
Cholesterol, Total: 195 mg/dL (ref 100–199)
HDL: 79 mg/dL (ref >39)
LDL Chol Calc (NIH): 105 mg/dL — ABNORMAL HIGH (ref 0–99)
Triglycerides: 61 mg/dL (ref 0–149)
VLDL Cholesterol Cal: 11 mg/dL (ref 5–40)

## 2022-06-21 LAB — TSH: TSH: 4.07 u[IU]/mL (ref 0.450–4.500)

## 2022-06-22 MED ORDER — FERROUS SULFATE 325 (65 FE) MG PO TBEC: 325.0000 mg | DELAYED_RELEASE_TABLET | Freq: Every day | ORAL | 3 refills | Status: DC

## 2022-06-22 MED ORDER — FAMOTIDINE 20 MG PO TABS: 20.0000 mg | ORAL_TABLET | Freq: Two times a day (BID) | ORAL | 1 refills | Status: DC

## 2022-06-22 MED ORDER — NAPROXEN 500 MG PO TBEC
500.0000 mg | DELAYED_RELEASE_TABLET | Freq: Two times a day (BID) | ORAL | 3 refills | Status: DC
Start: 2022-06-22 — End: 2022-09-08

## 2022-06-22 MED ORDER — ALBUTEROL SULFATE HFA 108 (90 BASE) MCG/ACT IN AERS: 2.0000 | INHALATION_SPRAY | Freq: Four times a day (QID) | RESPIRATORY_TRACT | 1 refills | Status: DC | PRN

## 2022-06-22 MED ORDER — OMEPRAZOLE 20 MG PO CPDR: 20.0000 mg | DELAYED_RELEASE_CAPSULE | Freq: Every day | ORAL | 3 refills | Status: DC

## 2022-06-22 MED ORDER — LISINOPRIL 10 MG PO TABS: 10.0000 mg | ORAL_TABLET | Freq: Every day | ORAL | 1 refills | Status: DC

## 2022-06-23 NOTE — Assessment & Plan Note (Signed)
Will keep BP and cholesterol under good control. Continue to monitor.  

## 2022-06-23 NOTE — Assessment & Plan Note (Signed)
Doing well off medicine. Continue to monitor. Call with any concerns.  

## 2022-06-23 NOTE — Assessment & Plan Note (Signed)
Reassured patient. Continue to monitor.  

## 2022-06-23 NOTE — Assessment & Plan Note (Signed)
Under good control on current regimen. Continue current regimen. Continue to monitor. Call with any concerns. Refills given. Labs drawn today.   

## 2022-06-23 NOTE — Assessment & Plan Note (Signed)
Doing well. Vyvanse 60 not available. Has been taking vyvanse 50. Continue current regimen. Continue to monitor. Refills up to date

## 2022-06-23 NOTE — Assessment & Plan Note (Signed)
Rechecking labs today. Await results. Treat as needed.  °

## 2022-08-28 ENCOUNTER — Ambulatory Visit
Admission: RE | Admit: 2022-08-28 | Discharge: 2022-08-28 | Disposition: A | Discharge status: Home / Self Care | Payer: PPO | Source: Ambulatory Visit | Attending: Family Medicine | Admitting: Family Medicine

## 2022-08-28 DIAGNOSIS — Z1231 Encounter for screening mammogram for malignant neoplasm of breast: Secondary | ICD-10-CM | POA: Diagnosis not present

## 2022-08-28 DIAGNOSIS — Z13820 Encounter for screening for osteoporosis: Secondary | ICD-10-CM

## 2022-08-28 DIAGNOSIS — Z780 Asymptomatic menopausal state: Secondary | ICD-10-CM | POA: Diagnosis not present

## 2022-08-28 DIAGNOSIS — M810 Age-related osteoporosis without current pathological fracture: Secondary | ICD-10-CM | POA: Diagnosis not present

## 2022-09-08 ENCOUNTER — Encounter: Payer: Self-pay | Admitting: Family Medicine

## 2022-09-08 ENCOUNTER — Ambulatory Visit (INDEPENDENT_AMBULATORY_CARE_PROVIDER_SITE_OTHER): Payer: PPO | Admitting: Family Medicine

## 2022-09-08 VITALS — BP 119/79 | HR 67 | Temp 97.4°F | Wt 221.8 lb

## 2022-09-08 DIAGNOSIS — Z23 Encounter for immunization: Secondary | ICD-10-CM | POA: Diagnosis not present

## 2022-09-08 DIAGNOSIS — M7989 Other specified soft tissue disorders: Secondary | ICD-10-CM | POA: Diagnosis not present

## 2022-09-08 DIAGNOSIS — F5081 Binge eating disorder: Secondary | ICD-10-CM

## 2022-09-08 DIAGNOSIS — M5441 Lumbago with sciatica, right side: Secondary | ICD-10-CM | POA: Diagnosis not present

## 2022-09-08 MED ORDER — LISDEXAMFETAMINE DIMESYLATE 60 MG PO CAPS: 60.0000 mg | ORAL_CAPSULE | ORAL | 0 refills | Status: DC

## 2022-09-08 MED ORDER — NAPROXEN 500 MG PO TABS: 500.0000 mg | ORAL_TABLET | Freq: Two times a day (BID) | ORAL | 1 refills | Status: DC

## 2022-09-08 MED ORDER — LISDEXAMFETAMINE DIMESYLATE 60 MG PO CAPS
60.0000 mg | ORAL_CAPSULE | ORAL | 0 refills | Status: DC
Start: 2022-10-08 — End: 2022-12-12

## 2022-09-08 NOTE — Progress Notes (Unsigned)
BP 119/79   Pulse 67   Temp (!) 97.4 F (36.3 C)   Wt 221 lb 12.8 oz (100.6 kg)   SpO2 96%   BMI 33.97 kg/m    Subjective:    Patient ID: Mckenzie Webster, female    DOB: 02-23-1951, 71 y.o.   MRN: [id number]  HPI: Mckenzie Webster is a 71 y.o. female  Chief Complaint  Patient presents with   Depression   Binge eating disorder     Relevant past medical, surgical, family and social history reviewed and updated as indicated. Interim medical history since our last visit reviewed. Allergies and medications reviewed and updated.  Review of Systems  Constitutional: Negative.   Respiratory: Negative.    Cardiovascular: Negative.   Gastrointestinal: Negative.   Musculoskeletal:  Positive for back pain. Negative for arthralgias, gait problem, joint swelling, myalgias, neck pain and neck stiffness.  Psychiatric/Behavioral: Negative.      Per HPI unless specifically indicated above     Objective:    BP 119/79   Pulse 67   Temp (!) 97.4 F (36.3 C)   Wt 221 lb 12.8 oz (100.6 kg)   SpO2 96%   BMI 33.97 kg/m   Wt Readings from Last 3 Encounters:  09/08/22 221 lb 12.8 oz (100.6 kg)  06/20/22 222 lb 8 oz (100.9 kg)  03/20/22 221 lb 6.4 oz (100.4 kg)    Physical Exam Vitals and nursing note reviewed.  Constitutional:      General: She is not in acute distress.    Appearance: Normal appearance. She is not ill-appearing, toxic-appearing or diaphoretic.  HENT:     Head: Normocephalic and atraumatic.     Right Ear: External ear normal.     Left Ear: External ear normal.     Nose: Nose normal.     Mouth/Throat:     Mouth: Mucous membranes are moist.     Pharynx: Oropharynx is clear.  Eyes:     General: No scleral icterus.       Right eye: No discharge.        Left eye: No discharge.     Extraocular Movements: Extraocular movements intact.     Conjunctiva/sclera: Conjunctivae normal.     Pupils: Pupils are equal, round, and reactive to light.  Cardiovascular:     Rate  and Rhythm: Normal rate and regular rhythm.     Pulses: Normal pulses.     Heart sounds: Normal heart sounds. No murmur heard.    No friction rub. No gallop.  Pulmonary:     Effort: Pulmonary effort is normal. No respiratory distress.     Breath sounds: Normal breath sounds. No stridor. No wheezing, rhonchi or rales.  Chest:     Chest wall: No tenderness.  Musculoskeletal:        General: Normal range of motion.     Cervical back: Normal range of motion and neck supple.  Skin:    General: Skin is warm and dry.     Capillary Refill: Capillary refill takes less than 2 seconds.     Coloration: Skin is not jaundiced or pale.     Findings: No bruising, erythema, lesion or rash.  Neurological:     General: No focal deficit present.     Mental Status: She is alert and oriented to person, place, and time. Mental status is at baseline.  Psychiatric:        Mood and Affect: Mood normal.  Behavior: Behavior normal.        Thought Content: Thought content normal.        Judgment: Judgment normal.     Results for orders placed or performed in visit on 06/20/22  CBC with Differential/Platelet  Result Value Ref Range   WBC 5.4 3.4 - 10.8 x10E3/uL   RBC 4.53 3.77 - 5.28 x10E6/uL   Hemoglobin 13.2 11.1 - 15.9 g/dL   Hematocrit 41.1 34.0 - 46.6 %   MCV 91 79 - 97 fL   MCH 29.1 26.6 - 33.0 pg   MCHC 32.1 31.5 - 35.7 g/dL   RDW 13.2 11.7 - 15.4 %   Platelets 271 150 - 450 x10E3/uL   Neutrophils 69 Not Estab. %   Lymphs 17 Not Estab. %   Monocytes 8 Not Estab. %   Eos 5 Not Estab. %   Basos 1 Not Estab. %   Neutrophils Absolute 3.7 1.4 - 7.0 x10E3/uL   Lymphocytes Absolute 0.9 0.7 - 3.1 x10E3/uL   Monocytes Absolute 0.4 0.1 - 0.9 x10E3/uL   EOS (ABSOLUTE) 0.3 0.0 - 0.4 x10E3/uL   Basophils Absolute 0.0 0.0 - 0.2 x10E3/uL   Immature Granulocytes 0 Not Estab. %   Immature Grans (Abs) 0.0 0.0 - 0.1 x10E3/uL  Comprehensive metabolic panel  Result Value Ref Range   Glucose 91 70 -  99 mg/dL   BUN 21 8 - 27 mg/dL   Creatinine, Ser 0.95 0.57 - 1.00 mg/dL   eGFR 64 >59 mL/min/1.73   BUN/Creatinine Ratio 22 12 - 28   Sodium 142 134 - 144 mmol/L   Potassium 4.5 3.5 - 5.2 mmol/L   Chloride 107 (H) 96 - 106 mmol/L   CO2 22 20 - 29 mmol/L   Calcium 9.1 8.7 - 10.3 mg/dL   Total Protein 6.4 6.0 - 8.5 g/dL   Albumin 4.1 3.9 - 4.9 g/dL   Globulin, Total 2.3 1.5 - 4.5 g/dL   Albumin/Globulin Ratio 1.8 1.2 - 2.2   Bilirubin Total 0.4 0.0 - 1.2 mg/dL   Alkaline Phosphatase 101 44 - 121 IU/L   AST 22 0 - 40 IU/L   ALT 16 0 - 32 IU/L  Lipid Panel w/o Chol/HDL Ratio  Result Value Ref Range   Cholesterol, Total 195 100 - 199 mg/dL   Triglycerides 61 0 - 149 mg/dL   HDL 79 >39 mg/dL   VLDL Cholesterol Cal 11 5 - 40 mg/dL   LDL Chol Calc (NIH) 105 (H) 0 - 99 mg/dL  Urinalysis, Routine w reflex microscopic  Result Value Ref Range   Specific Gravity, UA >1.030 (H) 1.005 - 1.030   pH, UA 5.5 5.0 - 7.5   Color, UA Yellow Yellow   Appearance Ur Clear Clear   Leukocytes,UA Negative Negative   Protein,UA Negative Negative/Trace   Glucose, UA Negative Negative   Ketones, UA Negative Negative   RBC, UA Negative Negative   Bilirubin, UA Negative Negative   Urobilinogen, Ur 0.2 0.2 - 1.0 mg/dL   Nitrite, UA Negative Negative  TSH  Result Value Ref Range   TSH 4.070 0.450 - 4.500 uIU/mL  Microalbumin, Urine Waived  Result Value Ref Range   Microalb, Ur Waived 30 (H) 0 - 19 mg/L   Creatinine, Urine Waived 200 10 - 300 mg/dL   Microalb/Creat Ratio <30 <30 mg/g      Assessment & Plan:   Problem List Items Addressed This Visit   None Visit Diagnoses     Need  for influenza vaccination    -  Primary   Relevant Orders   Flu Vaccine QUAD High Dose(Fluad)        Follow up plan: No follow-ups on file.

## 2022-09-10 NOTE — Assessment & Plan Note (Signed)
Under good control on current regimen. Continue current regimen. Continue to monitor. Call with any concerns. Refills given for 3 months. Follow up 3 months.    

## 2022-09-11 ENCOUNTER — Ambulatory Visit: Payer: Self-pay | Admitting: *Deleted

## 2022-09-11 NOTE — Telephone Encounter (Signed)
Message from Luciana Axe sent at 09/11/2022  2:33 PM EST  Summary: Clairification for medicaiton   Calling to receive clarity on naproxen (NAPROSYN) 500 MG tablet [[id number]] script and the other is for 06/22/22 DR. Reference Number [id number]          Call History   Type Contact Phone/Fax User  09/11/2022 02:30 PM EST Phone (Incoming) Herbalist ([place], [place] (Pharmacy) [phone] Luciana Axe

## 2022-09-11 NOTE — Telephone Encounter (Signed)
Information for pharmacy forwarded to [facility] for clarification.

## 2022-09-12 NOTE — Telephone Encounter (Signed)
Spoke with Adonis Brook from [facility] to clarify patient's prescription for Naproxen 500 MG. Adonis Brook verbalized understanding and has no further questions at this time.

## 2022-09-13 ENCOUNTER — Ambulatory Visit
Admission: RE | Admit: 2022-09-13 | Discharge: 2022-09-13 | Disposition: A | Discharge status: Home / Self Care | Payer: PPO | Source: Ambulatory Visit | Attending: Family Medicine | Admitting: Family Medicine

## 2022-09-13 DIAGNOSIS — R2242 Localized swelling, mass and lump, left lower limb: Secondary | ICD-10-CM | POA: Diagnosis not present

## 2022-09-13 DIAGNOSIS — M7989 Other specified soft tissue disorders: Secondary | ICD-10-CM | POA: Diagnosis not present

## 2022-11-17 DIAGNOSIS — D2272 Melanocytic nevi of left lower limb, including hip: Secondary | ICD-10-CM | POA: Diagnosis not present

## 2022-11-17 DIAGNOSIS — Z85828 Personal history of other malignant neoplasm of skin: Secondary | ICD-10-CM | POA: Diagnosis not present

## 2022-11-17 DIAGNOSIS — L821 Other seborrheic keratosis: Secondary | ICD-10-CM | POA: Diagnosis not present

## 2022-11-17 DIAGNOSIS — D2261 Melanocytic nevi of right upper limb, including shoulder: Secondary | ICD-10-CM | POA: Diagnosis not present

## 2022-11-17 DIAGNOSIS — L920 Granuloma annulare: Secondary | ICD-10-CM | POA: Diagnosis not present

## 2022-12-12 ENCOUNTER — Ambulatory Visit (INDEPENDENT_AMBULATORY_CARE_PROVIDER_SITE_OTHER): Payer: PPO | Admitting: Family Medicine

## 2022-12-12 ENCOUNTER — Encounter: Payer: Self-pay | Admitting: Family Medicine

## 2022-12-12 VITALS — BP 125/82 | HR 69 | Temp 97.9°F | Ht 67.75 in | Wt 221.0 lb

## 2022-12-12 DIAGNOSIS — F5081 Binge eating disorder: Secondary | ICD-10-CM | POA: Diagnosis not present

## 2022-12-12 DIAGNOSIS — D692 Other nonthrombocytopenic purpura: Secondary | ICD-10-CM

## 2022-12-12 DIAGNOSIS — B351 Tinea unguium: Secondary | ICD-10-CM

## 2022-12-12 DIAGNOSIS — I129 Hypertensive chronic kidney disease with stage 1 through stage 4 chronic kidney disease, or unspecified chronic kidney disease: Secondary | ICD-10-CM

## 2022-12-12 DIAGNOSIS — H9193 Unspecified hearing loss, bilateral: Secondary | ICD-10-CM

## 2022-12-12 DIAGNOSIS — I700 Atherosclerosis of aorta: Secondary | ICD-10-CM

## 2022-12-12 DIAGNOSIS — F339 Major depressive disorder, recurrent, unspecified: Secondary | ICD-10-CM | POA: Diagnosis not present

## 2022-12-12 MED ORDER — LISINOPRIL 10 MG PO TABS: 10.0000 mg | ORAL_TABLET | Freq: Every day | ORAL | 1 refills | Status: DC

## 2022-12-12 MED ORDER — FAMOTIDINE 20 MG PO TABS: 20.0000 mg | ORAL_TABLET | Freq: Two times a day (BID) | ORAL | 1 refills | Status: DC

## 2022-12-12 MED ORDER — LISDEXAMFETAMINE DIMESYLATE 60 MG PO CAPS: 60.0000 mg | ORAL_CAPSULE | ORAL | 0 refills | Status: DC

## 2022-12-12 NOTE — Assessment & Plan Note (Signed)
Stable off medicine. Continue current regimen. Call with any concerns.

## 2022-12-12 NOTE — Assessment & Plan Note (Signed)
Under good control on current regimen. Continue current regimen. Continue to monitor. Call with any concerns. Refills given. Labs drawn today.   

## 2022-12-12 NOTE — Progress Notes (Signed)
BP 125/82   Pulse 69   Temp 97.9 F (36.6 C) (Oral)   Ht 5' 7.75" (1.721 m)   Wt 221 lb (100.2 kg)   SpO2 100%   BMI 33.85 kg/m    Subjective:    Patient ID: Mckenzie Webster, female    DOB: 1950-12-19, 72 y.o.   MRN: [id number]  HPI: Mckenzie Webster is a 72 y.o. female  Chief Complaint  Patient presents with  . Hip Pain    Patient says she is noticing some pain in her R hip when she is laying on her hip.   . Nail Problem    Patient says has noticed some issues with her toenails and keeps hearing it may be a fungus.   [name] Kitchen Hearing Problem   Binge eating has been doing OK. She notes that she continues to have an issue at night. Weight has been stable.   HYPERTENSION / HYPERLIPIDEMIA Satisfied with current treatment? yes Duration of hypertension: chronic BP monitoring frequency: not checking BP medication side effects: no Past BP meds: lisinopril Duration of hyperlipidemia: chronic Cholesterol medication side effects: N/A Cholesterol supplements: none Past cholesterol medications: none Medication compliance: excellent compliance Aspirin: no Recent stressors: no Recurrent headaches: no Visual changes: no Palpitations: no Dyspnea: no Chest pain: no Lower extremity edema: no Dizzy/lightheaded: no  DEPRESSION Mood status: stable Satisfied with current treatment?: yes Symptom severity: mild  Duration of current treatment : years Side effects: no Medication compliance: excellent compliance Psychotherapy/counseling: no  Previous psychiatric medications: prozac Depressed mood: no Anxious mood: no Anhedonia: no Significant weight loss or gain: no Insomnia: no  Fatigue: no Feelings of worthlessness or guilt: no Impaired concentration/indecisiveness: no Suicidal ideations: no Hopelessness: no Crying spells: no    12/12/2022    9:06 AM 09/08/2022    9:30 AM 06/20/2022    9:18 AM 03/20/2022    8:47 AM 12/21/2021    8:31 AM  Depression screen PHQ 2/9  Decreased  Interest 0 0 0 2 0  Down, Depressed, Hopeless 0 0 0 2 0  PHQ - 2 Score 0 0 0 4 0  Altered sleeping 0 0 0 2 0  Tired, decreased energy 0 0 0 2 0  Change in appetite 0 3 0 3 0  Feeling bad or failure about yourself  0 0 0 0 0  Trouble concentrating 0 0 0 0 0  Moving slowly or fidgety/restless 0 0 0 0 0  Suicidal thoughts 0 0 0 0 0  PHQ-9 Score 0 3 0 11 0  Difficult doing work/chores Not difficult at all Not difficult at all Not difficult at all  Not difficult at all    Relevant past medical, surgical, family and social history reviewed and updated as indicated. Interim medical history since our last visit reviewed. Allergies and medications reviewed and updated.  Review of Systems  Constitutional:  Positive for fatigue. Negative for activity change, appetite change, chills, diaphoresis, fever and unexpected weight change.  Respiratory: Negative.    Cardiovascular: Negative.   Gastrointestinal: Negative.   Musculoskeletal:  Positive for arthralgias. Negative for back pain, gait problem, joint swelling, myalgias, neck pain and neck stiffness.  Neurological:  Positive for light-headedness. Negative for dizziness, tremors, seizures, syncope, facial asymmetry, speech difficulty, weakness, numbness and headaches.  Psychiatric/Behavioral: Negative.      Per HPI unless specifically indicated above     Objective:    BP 125/82   Pulse 69   Temp 97.9 F (36.6 C) (  Oral)   Ht 5' 7.75" (1.721 m)   Wt 221 lb (100.2 kg)   SpO2 100%   BMI 33.85 kg/m   Wt Readings from Last 3 Encounters:  12/12/22 221 lb (100.2 kg)  09/08/22 221 lb 12.8 oz (100.6 kg)  06/20/22 222 lb 8 oz (100.9 kg)    Physical Exam Vitals and nursing note reviewed.  Constitutional:      General: She is not in acute distress.    Appearance: Normal appearance. She is not ill-appearing, toxic-appearing or diaphoretic.  HENT:     Head: Normocephalic and atraumatic.     Right Ear: External ear normal.     Left Ear:  External ear normal.     Nose: Nose normal.     Mouth/Throat:     Mouth: Mucous membranes are moist.     Pharynx: Oropharynx is clear.  Eyes:     General: No scleral icterus.       Right eye: No discharge.        Left eye: No discharge.     Extraocular Movements: Extraocular movements intact.     Conjunctiva/sclera: Conjunctivae normal.     Pupils: Pupils are equal, round, and reactive to light.  Cardiovascular:     Rate and Rhythm: Normal rate and regular rhythm.     Pulses: Normal pulses.     Heart sounds: Normal heart sounds. No murmur heard.    No friction rub. No gallop.  Pulmonary:     Effort: Pulmonary effort is normal. No respiratory distress.     Breath sounds: Normal breath sounds. No stridor. No wheezing, rhonchi or rales.  Chest:     Chest wall: No tenderness.  Musculoskeletal:        General: Normal range of motion.     Cervical back: Normal range of motion and neck supple.  Skin:    General: Skin is warm and dry.     Capillary Refill: Capillary refill takes less than 2 seconds.     Coloration: Skin is not jaundiced or pale.     Findings: No bruising, erythema, lesion or rash.  Neurological:     General: No focal deficit present.     Mental Status: She is alert and oriented to person, place, and time. Mental status is at baseline.  Psychiatric:        Mood and Affect: Mood normal.        Behavior: Behavior normal.        Thought Content: Thought content normal.        Judgment: Judgment normal.    Results for orders placed or performed in visit on 06/20/22  CBC with Differential/Platelet  Result Value Ref Range   WBC 5.4 3.4 - 10.8 x10E3/uL   RBC 4.53 3.77 - 5.28 x10E6/uL   Hemoglobin 13.2 11.1 - 15.9 g/dL   Hematocrit 41.1 34.0 - 46.6 %   MCV 91 79 - 97 fL   MCH 29.1 26.6 - 33.0 pg   MCHC 32.1 31.5 - 35.7 g/dL   RDW 13.2 11.7 - 15.4 %   Platelets 271 150 - 450 x10E3/uL   Neutrophils 69 Not Estab. %   Lymphs 17 Not Estab. %   Monocytes 8 Not Estab. %    Eos 5 Not Estab. %   Basos 1 Not Estab. %   Neutrophils Absolute 3.7 1.4 - 7.0 x10E3/uL   Lymphocytes Absolute 0.9 0.7 - 3.1 x10E3/uL   Monocytes Absolute 0.4 0.1 - 0.9 x10E3/uL  EOS (ABSOLUTE) 0.3 0.0 - 0.4 x10E3/uL   Basophils Absolute 0.0 0.0 - 0.2 x10E3/uL   Immature Granulocytes 0 Not Estab. %   Immature Grans (Abs) 0.0 0.0 - 0.1 x10E3/uL  Comprehensive metabolic panel  Result Value Ref Range   Glucose 91 70 - 99 mg/dL   BUN 21 8 - 27 mg/dL   Creatinine, Ser 0.95 0.57 - 1.00 mg/dL   eGFR 64 >59 mL/min/1.73   BUN/Creatinine Ratio 22 12 - 28   Sodium 142 134 - 144 mmol/L   Potassium 4.5 3.5 - 5.2 mmol/L   Chloride 107 (H) 96 - 106 mmol/L   CO2 22 20 - 29 mmol/L   Calcium 9.1 8.7 - 10.3 mg/dL   Total Protein 6.4 6.0 - 8.5 g/dL   Albumin 4.1 3.9 - 4.9 g/dL   Globulin, Total 2.3 1.5 - 4.5 g/dL   Albumin/Globulin Ratio 1.8 1.2 - 2.2   Bilirubin Total 0.4 0.0 - 1.2 mg/dL   Alkaline Phosphatase 101 44 - 121 IU/L   AST 22 0 - 40 IU/L   ALT 16 0 - 32 IU/L  Lipid Panel w/o Chol/HDL Ratio  Result Value Ref Range   Cholesterol, Total 195 100 - 199 mg/dL   Triglycerides 61 0 - 149 mg/dL   HDL 79 >39 mg/dL   VLDL Cholesterol Cal 11 5 - 40 mg/dL   LDL Chol Calc (NIH) 105 (H) 0 - 99 mg/dL  Urinalysis, Routine w reflex microscopic  Result Value Ref Range   Specific Gravity, UA >1.030 (H) 1.005 - 1.030   pH, UA 5.5 5.0 - 7.5   Color, UA Yellow Yellow   Appearance Ur Clear Clear   Leukocytes,UA Negative Negative   Protein,UA Negative Negative/Trace   Glucose, UA Negative Negative   Ketones, UA Negative Negative   RBC, UA Negative Negative   Bilirubin, UA Negative Negative   Urobilinogen, Ur 0.2 0.2 - 1.0 mg/dL   Nitrite, UA Negative Negative  TSH  Result Value Ref Range   TSH 4.070 0.450 - 4.500 uIU/mL  Microalbumin, Urine Waived  Result Value Ref Range   Microalb, Ur Waived 30 (H) 0 - 19 mg/L   Creatinine, Urine Waived 200 10 - 300 mg/dL   Microalb/Creat Ratio <30 <30  mg/g      Assessment & Plan:   Problem List Items Addressed This Visit   None    Follow up plan: No follow-ups on file.

## 2022-12-12 NOTE — Assessment & Plan Note (Signed)
Reassured patient. Continue to monitor.  

## 2022-12-12 NOTE — Assessment & Plan Note (Signed)
Rechecking labs today. Await results. Treat as needed.  °

## 2022-12-13 ENCOUNTER — Encounter: Payer: Self-pay | Admitting: Family Medicine

## 2022-12-13 LAB — COMPREHENSIVE METABOLIC PANEL
ALT: 13 IU/L (ref 0–32)
AST: 18 IU/L (ref 0–40)
Albumin/Globulin Ratio: 1.8 (ref 1.2–2.2)
Albumin: 4 g/dL (ref 3.8–4.8)
Alkaline Phosphatase: 106 IU/L (ref 44–121)
BUN/Creatinine Ratio: 31 — ABNORMAL HIGH (ref 12–28)
BUN: 26 mg/dL (ref 8–27)
Bilirubin Total: 0.3 mg/dL (ref 0.0–1.2)
CO2: 22 mmol/L (ref 20–29)
Calcium: 8.8 mg/dL (ref 8.7–10.3)
Chloride: 106 mmol/L (ref 96–106)
Creatinine, Ser: 0.83 mg/dL (ref 0.57–1.00)
Globulin, Total: 2.2 g/dL (ref 1.5–4.5)
Glucose: 87 mg/dL (ref 70–99)
Potassium: 4.4 mmol/L (ref 3.5–5.2)
Sodium: 142 mmol/L (ref 134–144)
Total Protein: 6.2 g/dL (ref 6.0–8.5)
eGFR: 75 mL/min/{1.73_m2} (ref >59)

## 2022-12-13 LAB — LIPID PANEL W/O CHOL/HDL RATIO
Cholesterol, Total: 162 mg/dL (ref 100–199)
HDL: 76 mg/dL (ref >39)
LDL Chol Calc (NIH): 74 mg/dL (ref 0–99)
Triglycerides: 62 mg/dL (ref 0–149)
VLDL Cholesterol Cal: 12 mg/dL (ref 5–40)

## 2022-12-13 NOTE — Assessment & Plan Note (Signed)
Under good control on current regimen. Continue current regimen. Continue to monitor. Call with any concerns. Refills given for 3 months. Follow up 3 months.    

## 2022-12-18 ENCOUNTER — Telehealth: Payer: Self-pay | Admitting: Family Medicine

## 2022-12-18 NOTE — Telephone Encounter (Signed)
Contacted [name] to schedule their annual wellness visit. Appointment made for 12/26/2022.  [facility] Direct Dial: [phone]

## 2022-12-19 ENCOUNTER — Encounter: Payer: Self-pay | Admitting: Internal Medicine

## 2022-12-26 ENCOUNTER — Ambulatory Visit (INDEPENDENT_AMBULATORY_CARE_PROVIDER_SITE_OTHER): Payer: PPO

## 2022-12-26 VITALS — Ht 67.75 in | Wt 221.0 lb

## 2022-12-26 DIAGNOSIS — Z0000 Encounter for general adult medical examination without abnormal findings: Secondary | ICD-10-CM

## 2022-12-26 NOTE — Patient Instructions (Signed)
Mckenzie Webster , Thank you for taking time to come for your Medicare Wellness Visit. I appreciate your ongoing commitment to your health goals. Please review the following plan we discussed and let me know if I can assist you in the future.   These are the goals we discussed:  Goals      DIET - EAT MORE FRUITS AND VEGETABLES     DIET - INCREASE WATER INTAKE     Recommend drinking at least 6-8 glasses of water a day      Patient Stated     Keep drinking 6-8 ounces water daily         This is a list of the screening recommended for you and due dates:  Health Maintenance  Topic Date Due   COVID-19 Vaccine (3 - Pfizer risk series) 02/10/2020   Mammogram  08/29/2023   Medicare Annual Wellness Visit  12/27/2023   Colon Cancer Screening  05/09/2024   DEXA scan (bone density measurement)  08/28/2025   DTaP/Tdap/Td vaccine (2 - Td or Tdap) 02/15/2026   Pneumonia Vaccine  Completed   Flu Shot  Completed   Hepatitis C Screening: USPSTF Recommendation to screen - Ages 71-79 yo.  Completed   Zoster (Shingles) Vaccine  Completed   HPV Vaccine  Aged Out    Advanced directives: no  Conditions/risks identified: none  Next appointment: Follow up in one year for your annual wellness visit 12/31/23 @ 3:00 pm by phone   Preventive Care 65 Years and Older, Female Preventive care refers to lifestyle choices and visits with your health care provider that can promote health and wellness. What does preventive care include? A yearly physical exam. This is also called an annual well check. Dental exams once or twice a year. Routine eye exams. Ask your health care provider how often you should have your eyes checked. Personal lifestyle choices, including: Daily care of your teeth and gums. Regular physical activity. Eating a healthy diet. Avoiding tobacco and drug use. Limiting alcohol use. Practicing safe sex. Taking low-dose aspirin every day. Taking vitamin and mineral supplements as  recommended by your health care provider. What happens during an annual well check? The services and screenings done by your health care provider during your annual well check will depend on your age, overall health, lifestyle risk factors, and family history of disease. Counseling  Your health care provider may ask you questions about your: Alcohol use. Tobacco use. Drug use. Emotional well-being. Home and relationship well-being. Sexual activity. Eating habits. History of falls. Memory and ability to understand (cognition). Work and work Statistician. Reproductive health. Screening  You may have the following tests or measurements: Height, weight, and BMI. Blood pressure. Lipid and cholesterol levels. These may be checked every 5 years, or more frequently if you are over 46 years old. Skin check. Lung cancer screening. You may have this screening every year starting at age 44 if you have a 30-pack-year history of smoking and currently smoke or have quit within the past 15 years. Fecal occult blood test (FOBT) of the stool. You may have this test every year starting at age 71. Flexible sigmoidoscopy or colonoscopy. You may have a sigmoidoscopy every 5 years or a colonoscopy every 10 years starting at age 41. Hepatitis C blood test. Hepatitis B blood test. Sexually transmitted disease (STD) testing. Diabetes screening. This is done by checking your blood sugar (glucose) after you have not eaten for a while (fasting). You may have this done every 1-3  years. Bone density scan. This is done to screen for osteoporosis. You may have this done starting at age 48. Mammogram. This may be done every 1-2 years. Talk to your health care provider about how often you should have regular mammograms. Talk with your health care provider about your test results, treatment options, and if necessary, the need for more tests. Vaccines  Your health care provider may recommend certain vaccines, such  as: Influenza vaccine. This is recommended every year. Tetanus, diphtheria, and acellular pertussis (Tdap, Td) vaccine. You may need a Td booster every 10 years. Zoster vaccine. You may need this after age 35. Pneumococcal 13-valent conjugate (PCV13) vaccine. One dose is recommended after age 58. Pneumococcal polysaccharide (PPSV23) vaccine. One dose is recommended after age 66. Talk to your health care provider about which screenings and vaccines you need and how often you need them. This information is not intended to replace advice given to you by your health care provider. Make sure you discuss any questions you have with your health care provider. Document Released: 11/12/2015 Document Revised: 07/05/2016 Document Reviewed: 08/17/2015 Elsevier Interactive Patient Education  2017 [place] Prevention in the Home Falls can cause injuries. They can happen to people of all ages. There are many things you can do to make your home safe and to help prevent falls. What can I do on the outside of my home? Regularly fix the edges of walkways and driveways and fix any cracks. Remove anything that might make you trip as you walk through a door, such as a raised step or threshold. Trim any bushes or trees on the path to your home. Use bright outdoor lighting. Clear any walking paths of anything that might make someone trip, such as rocks or tools. Regularly check to see if handrails are loose or broken. Make sure that both sides of any steps have handrails. Any raised decks and porches should have guardrails on the edges. Have any leaves, snow, or ice cleared regularly. Use sand or salt on walking paths during winter. Clean up any spills in your garage right away. This includes oil or grease spills. What can I do in the bathroom? Use night lights. Install grab bars by the toilet and in the tub and shower. Do not use towel bars as grab bars. Use non-skid mats or decals in the tub or  shower. If you need to sit down in the shower, use a plastic, non-slip stool. Keep the floor dry. Clean up any water that spills on the floor as soon as it happens. Remove soap buildup in the tub or shower regularly. Attach bath mats securely with double-sided non-slip rug tape. Do not have throw rugs and other things on the floor that can make you trip. What can I do in the bedroom? Use night lights. Make sure that you have a light by your bed that is easy to reach. Do not use any sheets or blankets that are too big for your bed. They should not hang down onto the floor. Have a firm chair that has side arms. You can use this for support while you get dressed. Do not have throw rugs and other things on the floor that can make you trip. What can I do in the kitchen? Clean up any spills right away. Avoid walking on wet floors. Keep items that you use a lot in easy-to-reach places. If you need to reach something above you, use a strong step stool that has a grab  bar. Keep electrical cords out of the way. Do not use floor polish or wax that makes floors slippery. If you must use wax, use non-skid floor wax. Do not have throw rugs and other things on the floor that can make you trip. What can I do with my stairs? Do not leave any items on the stairs. Make sure that there are handrails on both sides of the stairs and use them. Fix handrails that are broken or loose. Make sure that handrails are as long as the stairways. Check any carpeting to make sure that it is firmly attached to the stairs. Fix any carpet that is loose or worn. Avoid having throw rugs at the top or bottom of the stairs. If you do have throw rugs, attach them to the floor with carpet tape. Make sure that you have a light switch at the top of the stairs and the bottom of the stairs. If you do not have them, ask someone to add them for you. What else can I do to help prevent falls? Wear shoes that: Do not have high heels. Have  rubber bottoms. Are comfortable and fit you well. Are closed at the toe. Do not wear sandals. If you use a stepladder: Make sure that it is fully opened. Do not climb a closed stepladder. Make sure that both sides of the stepladder are locked into place. Ask someone to hold it for you, if possible. Clearly mark and make sure that you can see: Any grab bars or handrails. First and last steps. Where the edge of each step is. Use tools that help you move around (mobility aids) if they are needed. These include: Canes. Walkers. Scooters. Crutches. Turn on the lights when you go into a dark area. Replace any light bulbs as soon as they burn out. Set up your furniture so you have a clear path. Avoid moving your furniture around. If any of your floors are uneven, fix them. If there are any pets around you, be aware of where they are. Review your medicines with your doctor. Some medicines can make you feel dizzy. This can increase your chance of falling. Ask your doctor what other things that you can do to help prevent falls. This information is not intended to replace advice given to you by your health care provider. Make sure you discuss any questions you have with your health care provider. Document Released: 08/12/2009 Document Revised: 03/23/2016 Document Reviewed: 11/20/2014 Elsevier Interactive Patient Education  2017 [employer].

## 2022-12-26 NOTE — Progress Notes (Signed)
I connected with  Mckenzie Webster on 12/26/22 by a audio enabled telemedicine application and verified that I am speaking with the correct person using two identifiers.  Patient Location: Home  Provider Location: Office/Clinic  I discussed the limitations of evaluation and management by telemedicine. The patient expressed understanding and agreed to proceed.  Subjective:   Mckenzie Webster is a 72 y.o. female who presents for Medicare Annual (Subsequent) preventive examination.  Review of Systems     Cardiac Risk Factors include: advanced age (>29mn, >>41women)     Objective:    Today's Vitals   12/26/22 0828  Weight: 221 lb (100.2 kg)  Height: 5' 7.75" (1.721 m)   Body mass index is 33.85 kg/m.     12/26/2022    8:19 AM 12/21/2021    8:32 AM 05/09/2021    6:48 AM 12/04/2018    8:25 AM 11/28/2017    8:48 AM 04/27/2017    7:45 AM 11/24/2016   10:17 AM  Advanced Directives  Does Patient Have a Medical Advance Directive? No No No No Yes No No  Does patient want to make changes to medical advance directive?     Yes (MAU/Ambulatory/Procedural Areas - Information given)    Would patient like information on creating a medical advance directive? No - Patient declined No - Patient declined  Yes (MAU/Ambulatory/Procedural Areas - Information given)   Yes (MAU/Ambulatory/Procedural Areas - Information given)  Pre-existing out of facility DNR order (yellow form or pink MOST form)      Physician notified to receive inpatient order     Current Medications (verified) Outpatient Encounter Medications as of 12/26/2022  Medication Sig   albuterol (VENTOLIN HFA) 108 (90 Base) MCG/ACT inhaler Inhale 2 puffs into the lungs every 6 (six) hours as needed for wheezing or shortness of breath.   cetirizine (ZYRTEC) 10 MG tablet Take 10 mg by mouth daily.   ferrous sulfate 325 (65 FE) MG EC tablet Take 1 tablet (325 mg total) by mouth daily.   fluticasone (FLONASE) 50 MCG/ACT nasal spray Place 2 sprays  into both nostrils daily.    hydroxychloroquine (PLAQUENIL) 200 MG tablet Take 200 mg by mouth 2 (two) times daily.   lisdexamfetamine (VYVANSE) 60 MG capsule Take 1 capsule (60 mg total) by mouth every morning.   [START ON 02/10/2023] lisdexamfetamine (VYVANSE) 60 MG capsule Take 1 capsule (60 mg total) by mouth every morning.   lisinopril (ZESTRIL) 10 MG tablet Take 1 tablet (10 mg total) by mouth daily.   naproxen (NAPROSYN) 500 MG tablet Take 1 tablet (500 mg total) by mouth 2 (two) times daily with a meal.   [DISCONTINUED] lisdexamfetamine (VYVANSE) 60 MG capsule Take 1 capsule (60 mg total) by mouth every morning.   famotidine (PEPCID) 20 MG tablet Take 1 tablet (20 mg total) by mouth 2 (two) times daily.   No facility-administered encounter medications on file as of 12/26/2022.    Allergies (verified) Patient has no known allergies.   History: Past Medical History:  Diagnosis Date   Anemia    Cataract    Dry eye syndrome    Positive colorectal cancer screening using Cologuard test 02/14/2017   Stomach ulcer    Past Surgical History:  Procedure Laterality Date   COLONOSCOPY WITH PROPOFOL N/A 04/27/2017   Procedure: COLONOSCOPY WITH PROPOFOL;  Surgeon: AJonathon Bellows MD;  [facility];  Service: Endoscopy;  Laterality: N/A;   COLONOSCOPY WITH PROPOFOL N/A 05/09/2021   Procedure: COLONOSCOPY WITH PROPOFOL;  Surgeon: Jonathon Bellows, MD;  Location: [facility] ENDOSCOPY;  Service: Gastroenterology;  Laterality: N/A;   EYE SURGERY     Cataract   FOOT SURGERY     Change the alignment of the toes to stop the formation of corns   gastric bypass  1981   Family History  Problem Relation Age of Onset   Heart disease Mother    COPD Mother    Heart disease Father    Hypertension Father    Heart disease Maternal Grandfather    Alzheimer's disease Paternal Grandmother    Breast cancer Neg Hx    Social History   Socioeconomic History   Marital status: Divorced    Spouse name: Not  on file   Number of children: Not on file   Years of education: Not on file   Highest education level: Some college, no degree  Occupational History   Occupation: Tax inspector part time   Tobacco Use   Smoking status: Former    Types: Cigarettes    Quit date: 10/30/1989    Years since quitting: 33.1   Smokeless tobacco: Never  Vaping Use   Vaping Use: Never used  Substance and Sexual Activity   Alcohol use: Yes    Comment: on occasion   Drug use: No   Sexual activity: Not Currently  Other Topics Concern   Not on file  Social History Narrative   Not on file   Social Determinants of Health   Financial Resource Strain: [place]  (12/26/2022)   Overall Financial Resource Strain (CARDIA)    Difficulty of Paying Living Expenses: Not hard at all  Food Insecurity: No Food Insecurity (12/26/2022)   Hunger Vital Sign    Worried About Running Out of Food in the Last Year: Never true    [place] in the Last Year: Never true  Transportation Needs: No Transportation Needs (12/26/2022)   PRAPARE - Hydrologist (Medical): No    Lack of Transportation (Non-Medical): No  Physical Activity: Inactive (12/26/2022)   Exercise Vital Sign    Days of Exercise per Week: 0 days    Minutes of Exercise per Session: 0 min  Stress: No Stress Concern Present (12/26/2022)   [place]    Feeling of Stress : Not at all  Social Connections: Moderately Isolated (12/26/2022)   Social Connection and Isolation Panel [NHANES]    Frequency of Communication with Friends and Family: More than three times a week    Frequency of Social Gatherings with Friends and Family: Three times a week    Attends Religious Services: More than 4 times per year    Active Member of Clubs or Organizations: No    Attends Archivist Meetings: Never    Marital Status: Divorced    Tobacco Counseling Counseling given: Not  Answered   Clinical Intake:  Pre-visit preparation completed: Yes  Pain : No/denies pain     Nutritional Risks: None Diabetes: No  How often do you need to have someone help you when you read instructions, pamphlets, or other written materials from your doctor or pharmacy?: 1 - Never  Diabetic?no  Interpreter Needed?: No  Information entered by :: Kirke Shaggy, LPN   Activities of Daily Living    12/26/2022    8:20 AM 12/25/2022    9:03 AM  In your present state of health, do you have any difficulty performing the following activities:  Hearing?  0 0  Vision? 0 0  Difficulty concentrating or making decisions? 0 0  Walking or climbing stairs? 0 0  Dressing or bathing? 0 0  Doing errands, shopping? 0 0  Preparing Food and eating ? N N  Using the Toilet? N N  In the past six months, have you accidently leaked urine? N N  Do you have problems with loss of bowel control? N N  Managing your Medications? N N  Managing your Finances? N N  Housekeeping or managing your Housekeeping? N N    Patient Care Team: Valerie Roys, DO as PCP - General (Family Medicine) Conception Chancy, DDS (Dentistry)  Indicate any recent Medical Services you may have received from other than [facility] providers in the past year (date may be approximate).     Assessment:   This is a routine wellness examination for [place].  Hearing/Vision screen Hearing Screening - Comments:: No aids, going to see an audiologist though Vision Screening - Comments:: [facility]  Dietary issues and exercise activities discussed: Current Exercise Habits: The patient does not participate in regular exercise at present   Goals Addressed             This Visit's Progress    DIET - EAT MORE FRUITS AND VEGETABLES         Depression Screen    12/26/2022    8:17 AM 12/12/2022    9:06 AM 09/08/2022    9:30 AM 06/20/2022    9:18 AM 03/20/2022    8:47 AM 12/21/2021    8:31 AM 12/20/2021   11:04 AM  PHQ  2/9 Scores  PHQ - 2 Score 0 0 0 0 4 0 0  PHQ- 9 Score 0 0 3 0 11 0 0    Fall Risk    12/26/2022    8:20 AM 12/25/2022    9:03 AM 12/12/2022    9:05 AM 06/20/2022    9:19 AM 12/21/2021    8:19 AM  Fall Risk   Falls in the past year? 0 0 0 1 1  Number falls in past yr: 0 0 0 0 0  Injury with Fall? 0 0 0 1 1  Risk for fall due to : No Fall Risks  No Fall Risks History of fall(s)   Follow up Falls prevention discussed;Falls evaluation completed  Falls evaluation completed Falls evaluation completed Falls evaluation completed;Education provided;Falls prevention discussed    FALL RISK PREVENTION PERTAINING TO THE HOME:  Any stairs in or around the home? Yes  If so, are there any without handrails? No  Home free of loose throw rugs in walkways, pet beds, electrical cords, etc? Yes  Adequate lighting in your home to reduce risk of falls? Yes   ASSISTIVE DEVICES UTILIZED TO PREVENT FALLS:  Life alert? No  Use of a cane, walker or w/c? No  Grab bars in the bathroom? No  Shower chair or bench in shower? Yes  Elevated toilet seat or a handicapped toilet? Yes    Cognitive Function:        12/26/2022    8:29 AM 12/26/2022    8:24 AM 12/04/2018    8:33 AM 11/28/2017    8:47 AM 11/24/2016   10:20 AM  6CIT Screen  What Year? 0 points 0 points 0 points 0 points 0 points  What month? 0 points 0 points 0 points 0 points 0 points  What time?  0 points 0 points 0 points 0 points  Count back  from 20  0 points 0 points 0 points 0 points  Months in reverse  0 points 0 points 0 points 0 points  Repeat phrase  0 points 0 points 0 points 0 points  Total Score  0 points 0 points 0 points 0 points    Immunizations Immunization History  Administered Date(s) Administered   Fluad Quad(high Dose 65+) 08/15/2019, 09/14/2020, 09/12/2021, 09/08/2022   Influenza, High Dose Seasonal PF 08/17/2017, 07/31/2018   Influenza,inj,Quad PF,6+ Mos 11/24/2016   Influenza-Unspecified 08/17/2017, 07/31/2018    PFIZER(Purple Top)SARS-COV-2 Vaccination 12/15/2019, 01/13/2020   Pneumococcal Conjugate-13 11/24/2016   Pneumococcal Polysaccharide-23 11/28/2017   Tdap 02/16/2016   Zoster Recombinat (Shingrix) 02/14/2017, 08/17/2017   Zoster, Live 02/14/2017    TDAP status: Up to date  Flu Vaccine status: Up to date  Pneumococcal vaccine status: Up to date  Covid-19 vaccine status: Completed vaccines  Qualifies for Shingles Vaccine? Yes   Zostavax completed Yes   Shingrix Completed?: Yes  Screening Tests Health Maintenance  Topic Date Due   COVID-19 Vaccine (3 - Pfizer risk series) 02/10/2020   MAMMOGRAM  08/29/2023   Medicare Annual Wellness (AWV)  12/27/2023   COLONOSCOPY (Pts 45-48yr Insurance coverage will need to be confirmed)  05/09/2024   DEXA SCAN  08/28/2025   DTaP/Tdap/Td (2 - Td or Tdap) 02/15/2026   Pneumonia Vaccine 71 Years old  Completed   INFLUENZA VACCINE  Completed   Hepatitis C Screening  Completed   Zoster Vaccines- Shingrix  Completed   HPV VACCINES  Aged Out    Health Maintenance  Health Maintenance Due  Topic Date Due   COVID-19 Vaccine (3 - Pfizer risk series) 02/10/2020    Colorectal cancer screening: Type of screening: Colonoscopy. Completed 05/09/21. Repeat every 3 years  Mammogram status: Completed 08/28/22. Repeat every year  Bone Density status: Completed 08/28/22. Results reflect: Bone density results: OSTEOPOROSIS. Repeat every 2 years.  Lung Cancer Screening: (Low Dose CT Chest recommended if Age 71-80years, 30 pack-year currently smoking OR have quit w/in 15years.) does not qualify.   Additional Screening:  Hepatitis C Screening: does qualify; Completed 11/24/16  Vision Screening: Recommended annual ophthalmology exams for early detection of glaucoma and other disorders of the eye. Is the patient up to date with their annual eye exam?  Yes  Who is the provider or what is the name of the office in which the patient attends annual eye exams?  [facility] If pt is not established with a provider, would they like to be referred to a provider to establish care? No .   Dental Screening: Recommended annual dental exams for proper oral hygiene  Community Resource Referral / Chronic Care Management: CRR required this visit?  No   CCM required this visit?  No      Plan:     I have personally reviewed and noted the following in the patient's chart:   Medical and social history Use of alcohol, tobacco or illicit drugs  Current medications and supplements including opioid prescriptions. Patient is not currently taking opioid prescriptions. Functional ability and status Nutritional status Physical activity Advanced directives List of other physicians Hospitalizations, surgeries, and ER visits in previous 12 months Vitals Screenings to include cognitive, depression, and falls Referrals and appointments  In addition, I have reviewed and discussed with patient certain preventive protocols, quality metrics, and best practice recommendations. A written personalized care plan for preventive services as well as general preventive health recommendations were provided to patient.  Dionisio David, LPN   [id number]   Nurse Notes: none

## 2022-12-27 ENCOUNTER — Ambulatory Visit: Payer: Self-pay | Admitting: *Deleted

## 2022-12-27 NOTE — Telephone Encounter (Signed)
Summary: sinus infection?   Pt states that she has been sick for over a week and think it has turned into a sinus infection. Pt is wanting to know if medication can be called into the pharmacy for her. Please advise.         Chief Complaint: sinus pain and ear itching  Symptoms: cough, sore throat, sinus pain cheeks and ears. Ears itching. Blowing nose for yellow to green mucus. Feeling worse than last week.  Frequency: 1 week ago Pertinent Negatives: Patient denies difficulty breathing no fever Disposition: '[]'$ ED /'[]'$ Urgent Care (no appt availability in office) / '[x]'$ Appointment(In office/virtual)/ '[]'$  [facility]/ '[]'$ Home Care/ '[]'$ Refused Recommended Disposition /'[]'$ [place] [facility]/ '[]'$  Follow-up with PCP Additional Notes:   Appt scheduled for 12/28/22. Requesting if earlier appt available today please call back. Requesting medication      Reason for Disposition  Earache  Answer Assessment - Initial Assessment Questions 1. LOCATION: "Where does it hurt?"      Cheeks, ears itching  2. ONSET: "When did the sinus pain start?"  (e.g., hours, days)      1 week ago  3. SEVERITY: "How bad is the pain?"   (Scale 1-10; mild, moderate or severe)   - MILD (1-3): doesn't interfere with normal activities    - MODERATE (4-7): interferes with normal activities (e.g., work or school) or awakens from sleep   - SEVERE (8-10): excruciating pain and patient unable to do any normal activities        Coughing keeps awake  4. RECURRENT SYMPTOM: "Have you ever had sinus problems before?" If Yes, ask: "When was the last time?" and "What happened that time?"      Yes  5. NASAL CONGESTION: "Is the nose blocked?" If Yes, ask: "Can you open it or must you breathe through your mouth?"     No  6. NASAL DISCHARGE: "Do you have discharge from your nose?" If so ask, "What color?"     Yes yellow green drainage 7. FEVER: "Do you have a fever?" If Yes, ask: "What is it, how was it measured, and when did  it start?"      no 8. OTHER SYMPTOMS: "Do you have any other symptoms?" (e.g., sore throat, cough, earache, difficulty breathing)     Sore throat cough ear itching sinus pain in face cheeks, negative covid test last week 9. PREGNANCY: "Is there any chance you are pregnant?" "When was your last menstrual period?"     na  Protocols used: Sinus Pain or Congestion-A-AH

## 2022-12-28 ENCOUNTER — Encounter: Payer: Self-pay | Admitting: Physician Assistant

## 2022-12-28 ENCOUNTER — Ambulatory Visit (INDEPENDENT_AMBULATORY_CARE_PROVIDER_SITE_OTHER): Payer: PPO | Admitting: Physician Assistant

## 2022-12-28 VITALS — BP 127/83 | HR 73 | Temp 98.0°F | Ht 67.76 in | Wt 219.0 lb

## 2022-12-28 DIAGNOSIS — J069 Acute upper respiratory infection, unspecified: Secondary | ICD-10-CM

## 2022-12-28 MED ORDER — BENZONATATE 200 MG PO CAPS: 200.0000 mg | ORAL_CAPSULE | Freq: Two times a day (BID) | ORAL | 0 refills | Status: DC | PRN

## 2022-12-28 MED ORDER — BENZONATATE 200 MG PO CAPS
200.0000 mg | ORAL_CAPSULE | Freq: Two times a day (BID) | ORAL | 0 refills | Status: DC | PRN
Start: 2022-12-28 — End: 2022-12-28

## 2022-12-28 NOTE — Telephone Encounter (Signed)
Pt already had an appointment scheduled for this morning @ 10:20 am.

## 2022-12-28 NOTE — Telephone Encounter (Signed)
Needs appt

## 2022-12-28 NOTE — Patient Instructions (Addendum)
Based on your described symptoms and the duration of symptoms it is likely that you have a viral upper respiratory infection (often called a "cold")  Symptoms can last for 3-10 days with lingering cough and intermittent symptoms lasting weeks after that.  The goal of treatment at this time is to reduce your symptoms and discomfort    I recommend regular formulation Mucinex and Robitussin to help with your symptoms  I recommend trying Flonase as well to help with the congestion and runny nose  You can also use nasal saline sprays to help flush out your sinuses     If your symptoms do not improve or become worse in the next 5-7 days please make an apt at the office so we can see you  Go to the ER if you begin to have more serious symptoms such as shortness of breath, trouble breathing, loss of consciousness, swelling around the eyes, high fever, severe lasting headaches, vision changes or neck pain/stiffness.

## 2022-12-28 NOTE — Progress Notes (Signed)
Acute Office Visit   Patient: Mckenzie Webster   DOB: 06/03/1951   72 y.o. Female  MRN: [id number] Visit Date: 12/28/2022  Today's healthcare provider: Dani Gobble Hildegarde Dunaway, PA-C  Introduced myself to the patient as a Journalist, newspaper and provided education on APPs in clinical practice.    Chief Complaint  Patient presents with   Sore Throat    Sinus pressure, and cough, started last week    Subjective    HPI HPI     Sore Throat    Additional comments: Sinus pressure, and cough, started last week       Last edited by Jerelene Redden, CMA on 12/28/2022 10:15 AM.      Mckenzie Webster symptoms   Onset: gradual  Duration: since last Monday or Tuesday  Reports it seemed like it was improving but then symptoms became worse   Associated symptoms: sore throat and pain with swallowing, sinus pressure and pain, intermittently productive coughing, ear pain and itching   Interventions: Tylenol,   Recent sick contacts: none   COVID testing at home: She [facility] week - this was negative      Medications: Outpatient Medications Prior to Visit  Medication Sig   albuterol (VENTOLIN HFA) 108 (90 Base) MCG/ACT inhaler Inhale 2 puffs into the lungs every 6 (six) hours as needed for wheezing or shortness of breath.   cetirizine (ZYRTEC) 10 MG tablet Take 10 mg by mouth daily.   famotidine (PEPCID) 20 MG tablet Take 1 tablet (20 mg total) by mouth 2 (two) times daily.   ferrous sulfate 325 (65 FE) MG EC tablet Take 1 tablet (325 mg total) by mouth daily.   fluticasone (FLONASE) 50 MCG/ACT nasal spray Place 2 sprays into both nostrils daily.    hydroxychloroquine (PLAQUENIL) 200 MG tablet Take 200 mg by mouth 2 (two) times daily.   lisdexamfetamine (VYVANSE) 60 MG capsule Take 1 capsule (60 mg total) by mouth every morning.   [START ON 02/10/2023] lisdexamfetamine (VYVANSE) 60 MG capsule Take 1 capsule (60 mg total) by mouth every morning.   lisinopril (ZESTRIL) 10 MG tablet Take 1  tablet (10 mg total) by mouth daily.   naproxen (NAPROSYN) 500 MG tablet Take 1 tablet (500 mg total) by mouth 2 (two) times daily with a meal.   No facility-administered medications prior to visit.    Review of Systems  Constitutional:  Negative for chills, fatigue and fever.  HENT:  Positive for congestion, ear pain, postnasal drip, sinus pressure, sinus pain and sore throat.   Respiratory:  Positive for cough.   Gastrointestinal:  Negative for diarrhea, nausea and vomiting.  Musculoskeletal:  Positive for myalgias.  Neurological:  Positive for headaches.       Objective    BP 127/83   Pulse 73   Temp 98 F (36.7 C) (Oral)   Ht 5' 7.76" (1.721 m)   Wt 219 lb (99.3 kg)   SpO2 97%   BMI 33.54 kg/m    Physical Exam Vitals reviewed.  Constitutional:      General: She is awake.     Appearance: Normal appearance. She is well-developed and well-groomed.  HENT:     Head: Normocephalic and atraumatic.     Right Ear: Hearing, tympanic membrane and ear canal normal.     Left Ear: Hearing, tympanic membrane and ear canal normal.     Mouth/Throat:     Lips: Pink.  Mouth: Mucous membranes are moist.     Pharynx: Oropharynx is clear. Uvula midline. No pharyngeal swelling, oropharyngeal exudate or posterior oropharyngeal erythema.  Eyes:     General: Lids are normal. Gaze aligned appropriately.     Extraocular Movements: Extraocular movements intact.     Conjunctiva/sclera: Conjunctivae normal.     Pupils: Pupils are equal, round, and reactive to light.  Cardiovascular:     Rate and Rhythm: Normal rate and regular rhythm.     Pulses: Normal pulses.     Heart sounds: Normal heart sounds.  Pulmonary:     Effort: Pulmonary effort is normal.     Breath sounds: Normal breath sounds. No decreased air movement. No decreased breath sounds, wheezing, rhonchi or rales.  Neurological:     Mental Status: She is alert.  Psychiatric:        Behavior: Behavior is cooperative.        No results found for any visits on 12/28/22.  Assessment & Plan      No follow-ups on file.       Problem List Items Addressed This Visit   None Visit Diagnoses     Viral upper respiratory tract infection    -  Primary Acute, new concern Visit with patient indicates symptoms comprised of congestion, ear pain, postnasal drainage, sore throat, cough for the past 10 days  congruent with acute URI that is likely viral in nature  Patient is outside therapeutic window for antivirals- no flu or COVID testing performed today.  Due to nature and duration of symptoms recommended treatment regimen is symptomatic relief and follow up if needed Discussed with patient the various viral and bacterial etiologies of current illness and appropriate course of treatment. Duration of symptoms is consistent with bacterial sinus infection and acute viral URI- recommend trying symptomatic relief first before starting abx.- discussed this with patient and she voiced understanding and agreement. If symptoms are not improving or get worse by Monday will send in abx for bacterial sinusitis.   Sent in script for Tessalon pearls to assist with coughing today. Discussed OTC medication options for symptom relief such as regular formulation Mucinex, Robitussin, Flonase and tylenol  Discussed return precautions if symptoms are not improving or worsen over next 5-7 days.     Relevant Medications   benzonatate (TESSALON) 200 MG capsule        No follow-ups on file.   I, Ramere Downs E Ellery Tash, PA-C, have reviewed all documentation for this visit. The documentation on 12/28/22 for the exam, diagnosis, procedures, and orders are all accurate and complete.   Talitha Givens, MHS, PA-C [facility]

## 2023-01-02 ENCOUNTER — Other Ambulatory Visit: Payer: Self-pay

## 2023-01-02 ENCOUNTER — Ambulatory Visit: Payer: Self-pay | Admitting: *Deleted

## 2023-01-02 ENCOUNTER — Emergency Department
Admission: EM | Admit: 2023-01-02 | Discharge: 2023-01-02 | Disposition: A | Discharge status: Home / Self Care | Payer: PPO | Attending: Emergency Medicine | Admitting: Emergency Medicine

## 2023-01-02 ENCOUNTER — Ambulatory Visit: Payer: PPO | Admitting: Podiatry

## 2023-01-02 ENCOUNTER — Encounter: Payer: Self-pay | Admitting: Podiatry

## 2023-01-02 ENCOUNTER — Ambulatory Visit (INDEPENDENT_AMBULATORY_CARE_PROVIDER_SITE_OTHER): Payer: PPO

## 2023-01-02 VITALS — BP 127/83 | HR 81

## 2023-01-02 DIAGNOSIS — Y9389 Activity, other specified: Secondary | ICD-10-CM | POA: Insufficient documentation

## 2023-01-02 DIAGNOSIS — S61412A Laceration without foreign body of left hand, initial encounter: Secondary | ICD-10-CM | POA: Insufficient documentation

## 2023-01-02 DIAGNOSIS — M205X1 Other deformities of toe(s) (acquired), right foot: Secondary | ICD-10-CM | POA: Diagnosis not present

## 2023-01-02 DIAGNOSIS — M2011 Hallux valgus (acquired), right foot: Secondary | ICD-10-CM | POA: Diagnosis not present

## 2023-01-02 DIAGNOSIS — W260XXA Contact with knife, initial encounter: Secondary | ICD-10-CM | POA: Diagnosis not present

## 2023-01-02 MED ORDER — CEPHALEXIN 500 MG PO CAPS: 500.0000 mg | ORAL_CAPSULE | Freq: Four times a day (QID) | ORAL | 0 refills | Status: DC

## 2023-01-02 MED ORDER — LIDOCAINE-EPINEPHRINE-TETRACAINE (LET) TOPICAL GEL
3.0000 mL | Freq: Once | TOPICAL | Status: AC
  Administered 2023-01-02: 3 mL via TOPICAL
  Filled 2023-01-02: qty 3

## 2023-01-02 NOTE — Telephone Encounter (Signed)
Reason for Disposition  Skin is split open or gaping (or length > 1/2 inch or 12 mm on the skin, 1/4 inch or 6 mm on the face)  Answer Assessment - Initial Assessment Questions 1. APPEARANCE of INJURY: "What does the injury look like?"      I was trying to cut a plastic seal and the knife slipped and stabbed me.     I wrapped it last night to keep it closed.   This morning it's been very painful.       Happened last night.      It hurts to move my hand or use my fingers. 2. SIZE: "How large is the cut?"      It's about a quarter of a inch.   I think I need stitches.   It's kinda deep.   I was hoping I would not need stitches but I'm sure I do.   I wrapped it hoping it would heal.  3. BLEEDING: "Is it bleeding now?" If Yes, ask: "Is it difficult to stop?"      No  I got the bleeding stopped when it happened yesterday afternoon. 4. LOCATION: "Where is the injury located?"      It's between the thumb and forefinger.   5. ONSET: "How long ago did the injury occur?"      Late yesterday afternoon 6. MECHANISM: "Tell me how it happened."       I was trying to cut a plastic seal and the knife slipped and stabbed me. 7. TETANUS: "When was the last tetanus booster?"     Not asked 8. PREGNANCY: "Is there any chance you are pregnant?" "When was your last menstrual period?"     N/A due to age  Protocols used: Cuts and Lacerations-A-AH

## 2023-01-02 NOTE — Discharge Instructions (Signed)
Keep the area clean with soap and water.  You may take the antibiotic as prescribed.  Please return for any new, worsening, or changing symptoms or other concerns.  It was a pleasure caring for you today.

## 2023-01-02 NOTE — ED Provider Notes (Signed)
[facility] Provider Note    Event Date/Time   First MD Initiated Contact with Patient 01/02/23 1434     (approximate)   History   Laceration (Left hand)   HPI  Mckenzie Webster is a 72 y.o. female right-hand-dominant presents today for evaluation of left hand laceration.  Patient reports that this occurred last night while she was trying to open a bottle.  She reports that she wrapped it up, and this morning there was a small amount of blood on her bandage, which concerned her so she came to the emergency department for evaluation.  She denies any motion.  She reports that her tetanus is up-to-date.  Patient Active Problem List   Diagnosis Date Noted   Senile purpura ([place]) 06/20/2022   Class 1 obesity due to excess calories with serious comorbidity and body mass index (BMI) of 33.0 to 33.9 in adult 03/20/2022   Aortic atherosclerosis ([place]) 01/19/2021   Benign hypertensive renal disease 12/07/2020   Lung nodule seen on imaging study 09/21/2020   Depression, recurrent ([place]) 03/24/2020   Drug rash 06/04/2019   Osteoporosis 11/28/2017   Binge eating disorder 01/04/2016   Iron deficiency anemia    GERD without esophagitis 01/29/2015          Physical Exam   Triage Vital Signs: ED Triage Vitals  Enc Vitals Group     BP 01/02/23 1431 124/81     Pulse Rate 01/02/23 1431 96     Resp 01/02/23 1431 18     Temp 01/02/23 1431 98.7 F (37.1 C)     Temp Source 01/02/23 1431 Oral     SpO2 01/02/23 1431 100 %     Weight 01/02/23 1432 216 lb (98 kg)     Height 01/02/23 1432 '5\' 9"'$  (1.753 m)     Head Circumference --      Peak Flow --      Pain Score 01/02/23 1431 4     Pain Loc --      Pain Edu? --      Excl. in [place]? --     Most recent vital signs: Vitals:   01/02/23 1431  BP: 124/81  Pulse: 96  Resp: 18  Temp: 98.7 F (37.1 C)  SpO2: 100%    Physical Exam Vitals and nursing note reviewed.  Constitutional:      General: Awake and alert. No  acute distress.    Appearance: Normal appearance. The patient is obese.  HENT:     Head: Normocephalic and atraumatic.     Mouth: Mucous membranes are moist.  Eyes:     General: PERRL. Normal EOMs        Right eye: No discharge.        Left eye: No discharge.     Conjunctiva/sclera: Conjunctivae normal.  Cardiovascular:     Rate and Rhythm: Normal rate and regular rhythm.     Pulses: Normal pulses.  Pulmonary:     Effort: Pulmonary effort is normal. No respiratory distress.  Abdominal:     Abdomen is soft. There is no abdominal tenderness. No rebound or guarding. No distention. Musculoskeletal:        General: No swelling. Normal range of motion.     Cervical back: Normal range of motion and neck supple.  Left hand: 1 cm laceration in the webspace between thumb and index finger.  No surrounding erythema.  Area appears to have scabbing over the top, unable to pry edges apart.  She has normal range of motion with abduction and adduction against resistance of thumb as well as PIP and DIP of all digits.  No swelling noted.  Normal range of motion of wrist, elbow, shoulder.  No active bleeding. Skin:    General: Skin is warm and dry.     Capillary Refill: Capillary refill takes less than 2 seconds.     Findings: No rash.  Neurological:     Mental Status: The patient is awake and alert.      ED Results / Procedures / Treatments   Labs (all labs ordered are listed, but only abnormal results are displayed) Labs Reviewed - No data to display   EKG     RADIOLOGY     PROCEDURES:  Critical Care performed:   Procedures   MEDICATIONS ORDERED IN ED: Medications  lidocaine-EPINEPHrine-tetracaine (LET) topical gel (3 mLs Topical Given 01/02/23 1443)     IMPRESSION / MDM / [place] / ED COURSE  I reviewed the triage vital signs and the nursing notes.   Differential diagnosis includes, but is not limited to, laceration, retained foreign body, infection, tendon  injury.  Patient is awake and alert, hemodynamically stable and afebrile.  She has full normal range of motion of all of her fingers.  The area appears to have healed over already.  The area was anesthetized, and attempted to pry the wound edges open to evaluate depth of the wound, however the wound appears to have healed already.  There is no surrounding erythema, though the area is tender, so patient was given antibiotics in case of developing infection.  She does report that she washed it out after it happened.  I do not suspect tendon injury.  Patient is quite certain that there is something stuck inside and she did not does not want x-ray.  We discussed care of the wound with washing her hands with soap and water multiple times per day, and she was started on prophylactic antibiotics.  She reports that her tetanus vaccination is up-to-date, therefore this was not given today.  Discussed return precautions and the importance of close outpatient follow-up for wound recheck in the next 2 to 3 days.  Patient understands and agrees with plan.  She was discharged in stable condition.  Patient's presentation is most consistent with acute complicated illness / injury requiring diagnostic workup.    FINAL CLINICAL IMPRESSION(S) / ED DIAGNOSES   Final diagnoses:  Laceration of left hand, foreign body presence unspecified, initial encounter     Rx / DC Orders   ED Discharge Orders          Ordered    cephALEXin (KEFLEX) 500 MG capsule  4 times daily        01/02/23 1548             Note:  This document was prepared using Dragon voice recognition software and may include unintentional dictation errors.   Marquette Old, PA-C 01/02/23 1714    Lavonia Drafts, MD 01/03/23 [phone]

## 2023-01-02 NOTE — Patient Instructions (Signed)
[place] Walking or Running Shoes -Basically you are looking for a thick soled shoe with almost a 'rocker bottom'. Fabric top which will allow breathing -Other brands to consider are [employer] or Brooks  Oofos slides/sandals for around the house

## 2023-01-02 NOTE — Progress Notes (Signed)
Chief Complaint  Patient presents with   Nail Problem    "The big toe on my left foot, the toenail, can not quite get healed." N - toenail L - hallux left D - summer 2023 O - suddenly, same C - nail fell off, hasn't grown back, throbs sometime A - hit it or on feet a lot T - none    Bunions    "I also have a bunion that's painful." N - bunion pain L - right D - 30 - 40 yrs O - slowly worse C - throb, ache A - shoes, walk long periods of time T - wider shoes    HPI: 72 y.o. female presenting today as a new patient for evaluation of 2 separate complaints.  The minor complaint is in regards to her toenail to the left great toe.  Patient states that she enjoys working in the yard and it seems like throughout the summer she injured her toenail and eventually her toenail fell off.  Unfortunately it has not quite grown back and she would like to have it evaluated.  It can cause some tenderness intermittently.  Patient also complains of pain and tenderness associated to a bunion deformity of the right foot.  She has not had it evaluated before but she is concerned for her bunion.  She says it has progressively become more symptomatic over the past 30 to 40 years.  It is now becoming very painful and aggravating on a daily basis.  She has tried different shoes with minimal to no relief.  She wants to be active but her right foot causes severe pain and tenderness especially with walking and activity.  She presents for further treatment evaluation  Past Medical History:  Diagnosis Date   Anemia    Cataract    Dry eye syndrome    Positive colorectal cancer screening using Cologuard test 02/14/2017   Stomach ulcer     Past Surgical History:  Procedure Laterality Date   COLONOSCOPY WITH PROPOFOL N/A 04/27/2017   Procedure: COLONOSCOPY WITH PROPOFOL;  Surgeon: Jonathon Bellows, MD;  [facility] END[facility]COPY;  Service: Endoscopy;  Laterality: N/A;   COLONOSCOPY WITH PROPOFOL N/A 05/09/2021    Procedure: COLONOSCOPY WITH PROPOFOL;  Surgeon: Jonathon Bellows, MD;  Location: [facility] ENDOSCOPY;  Service: Gastroenterology;  Laterality: N/A;   EYE SURGERY     Cataract   FOOT SURGERY     Change the alignment of the toes to stop the formation of corns   gastric bypass  1981    No Known Allergies   Physical Exam: General: The patient is alert and oriented x3 in no acute distress.  Dermatology: Skin is warm, dry and supple bilateral lower extremities. Negative for open lesions or macerations.  Absence of the left hallux nail plate noted.  Healthy stable nailbed with skin formation noted  Vascular: Palpable pedal pulses bilaterally. Capillary refill within normal limits.  Negative for any significant edema or erythema  Neurological: Light touch and protective threshold grossly intact  Musculoskeletal Exam: No pedal deformities noted  Radiographic Exam RT foot 01/02/2023:  Severe degenerative changes noted to the first MTP of the right foot with para-articular spurring consistent with hallux limitus and DJD of the great toe joint.  All other joints preserved.  No acute fractures identified.  Assessment: 1.  Symptomatic painful hallux limitus right x several years 2.  Loss of toenail left hallux   Plan of Care:  1. Patient evaluated. X-Rays reviewed.  2.  In regards to the loss of the toenail of the left hallux, recommend topical nail rejuvenation to help soften the nail plate as it grows out.  Also recommend biotin supplement available at any pharmacy 3.  Today we had a very detailed discussion about the patient's symptoms of the right great toe joint.  Explained that her pain is not necessarily coming from a bunion deformity since the first ray is overall in good alignment but rather from the degenerative arthritic condition of the great toe joint with para-articular spurring.  Discussed additional conservative treatment options including shoe gear modifications and anti-inflammatories versus  ultimately surgery which would consist of arthroplasty with implant.  After the long discussion with the patient she would like to proceed with surgical intervention at this time.  She has had this pain for several years and is very frustrated since it is so painful on a daily basis.  Surgery was discussed in detail including the procedure and postoperative recovery.  No guarantees were expressed or implied.  All patient questions were answered. 4.  Authorization for surgery was initiated today.  Surgery will consist of right great toe arthroplasty with implant 5.  Return to clinic 1 week postop       Edrick Kins, DPM [facility]  Dr. Edrick Kins, DPM    [address], [place] [address]                Office [phone]  Fax [phone]

## 2023-01-02 NOTE — Telephone Encounter (Signed)
  Chief Complaint: "stabbed her hand with a knife yesterday evening while trying to cut a plastic seal off. Symptoms: Not bleeding.   Her whole hand is very painful.   The cut is between thumb and forefinger and is deep.   She did not seek care yesterday after it happened.   She wrapped it hoping it would heal but the pain is intense toay. Frequency: Very painful even to move her hand. Pertinent Negatives: Patient denies being seen by a provider yesterday after it happened. Disposition: '[]'$ ED /'[x]'$ Urgent Care (no appt availability in office) / '[]'$ Appointment(In office/virtual)/ '[]'$  [place] [facility]/ '[]'$ Home Care/ '[]'$ Refused Recommended Disposition /'[]'$ [place] [facility]/ '[]'$  Follow-up with PCP Additional Notes: She is agreeable to going to the urgent care now.    I told her the urgent care or ED.    The urgent care is not so long a wait and they can stitch it there too.   She asked if it would be cheaper than the ED and I let her know it would.

## 2023-01-02 NOTE — ED Triage Notes (Signed)
Pt states coming in with a laceration to the left hand. Pt states that she was opening something with a knife last night and it cut her hand. Pt states she is up to date on her tetanus vaccine. Hand is bandaged by pt

## 2023-01-03 ENCOUNTER — Encounter: Payer: Self-pay | Admitting: Emergency Medicine

## 2023-01-03 ENCOUNTER — Emergency Department: Payer: PPO

## 2023-01-03 ENCOUNTER — Observation Stay
Admission: EM | Admit: 2023-01-03 | Discharge: 2023-01-04 | Disposition: A | Discharge status: Home / Self Care | Payer: PPO | Attending: Internal Medicine | Admitting: Internal Medicine

## 2023-01-03 DIAGNOSIS — I10 Essential (primary) hypertension: Secondary | ICD-10-CM | POA: Diagnosis present

## 2023-01-03 DIAGNOSIS — F339 Major depressive disorder, recurrent, unspecified: Secondary | ICD-10-CM | POA: Diagnosis present

## 2023-01-03 DIAGNOSIS — M7989 Other specified soft tissue disorders: Secondary | ICD-10-CM | POA: Diagnosis not present

## 2023-01-03 DIAGNOSIS — D509 Iron deficiency anemia, unspecified: Secondary | ICD-10-CM | POA: Diagnosis present

## 2023-01-03 DIAGNOSIS — L03114 Cellulitis of left upper limb: Principal | ICD-10-CM | POA: Insufficient documentation

## 2023-01-03 DIAGNOSIS — Z79899 Other long term (current) drug therapy: Secondary | ICD-10-CM | POA: Insufficient documentation

## 2023-01-03 DIAGNOSIS — K219 Gastro-esophageal reflux disease without esophagitis: Secondary | ICD-10-CM | POA: Diagnosis not present

## 2023-01-03 DIAGNOSIS — Z87891 Personal history of nicotine dependence: Secondary | ICD-10-CM | POA: Diagnosis not present

## 2023-01-03 LAB — COMPREHENSIVE METABOLIC PANEL
ALT: 15 U/L (ref 0–44)
AST: 19 U/L (ref 15–41)
Albumin: 3.7 g/dL (ref 3.5–5.0)
Alkaline Phosphatase: 98 U/L (ref 38–126)
Anion gap: 8 (ref 5–15)
BUN: 28 mg/dL — ABNORMAL HIGH (ref 8–23)
CO2: 25 mmol/L (ref 22–32)
Calcium: 9 mg/dL (ref 8.9–10.3)
Chloride: 101 mmol/L (ref 98–111)
Creatinine, Ser: 0.93 mg/dL (ref 0.44–1.00)
GFR, Estimated: >60 mL/min (ref >60)
Glucose, Bld: 107 mg/dL — ABNORMAL HIGH (ref 70–99)
Potassium: 3.9 mmol/L (ref 3.5–5.1)
Sodium: 134 mmol/L — ABNORMAL LOW (ref 135–145)
Total Bilirubin: 1.1 mg/dL (ref 0.3–1.2)
Total Protein: 7.2 g/dL (ref 6.5–8.1)

## 2023-01-03 LAB — CBC WITH DIFFERENTIAL/PLATELET
Abs Immature Granulocytes: 0.07 10*3/uL (ref 0.00–0.07)
Basophils Absolute: 0 10*3/uL (ref 0.0–0.1)
Basophils Relative: 0 %
Eosinophils Absolute: 0.2 10*3/uL (ref 0.0–0.5)
Eosinophils Relative: 1 %
HCT: 42.2 % (ref 36.0–46.0)
Hemoglobin: 13.8 g/dL (ref 12.0–15.0)
Immature Granulocytes: 1 %
Lymphocytes Relative: 4 %
Lymphs Abs: 0.6 10*3/uL — ABNORMAL LOW (ref 0.7–4.0)
MCH: 28.9 pg (ref 26.0–34.0)
MCHC: 32.7 g/dL (ref 30.0–36.0)
MCV: 88.3 fL (ref 80.0–100.0)
Monocytes Absolute: 1.1 10*3/uL — ABNORMAL HIGH (ref 0.1–1.0)
Monocytes Relative: 7 %
Neutro Abs: 12.9 10*3/uL — ABNORMAL HIGH (ref 1.7–7.7)
Neutrophils Relative %: 87 %
Platelets: 253 10*3/uL (ref 150–400)
RBC: 4.78 MIL/uL (ref 3.87–5.11)
RDW: 13 % (ref 11.5–15.5)
WBC: 14.9 10*3/uL — ABNORMAL HIGH (ref 4.0–10.5)
nRBC: 0 % (ref 0.0–0.2)

## 2023-01-03 LAB — LACTIC ACID, PLASMA
Lactic Acid, Venous: 1.1 mmol/L (ref 0.5–1.9)
Lactic Acid, Venous: 1.3 mmol/L (ref 0.5–1.9)

## 2023-01-03 MED ORDER — ACETAMINOPHEN 325 MG PO TABS
650.0000 mg | ORAL_TABLET | Freq: Four times a day (QID) | ORAL | Status: DC | PRN
  Administered 2023-01-03: 650 mg via ORAL
  Filled 2023-01-03: qty 2

## 2023-01-03 MED ORDER — LISDEXAMFETAMINE DIMESYLATE 20 MG PO CAPS
60.0000 mg | ORAL_CAPSULE | ORAL | Status: DC
  Administered 2023-01-04: 60 mg via ORAL
  Filled 2023-01-03: qty 3

## 2023-01-03 MED ORDER — VANCOMYCIN HCL 500 MG/100ML IV SOLN
500.0000 mg | Freq: Once | INTRAVENOUS | Status: DC
  Filled 2023-01-03: qty 100

## 2023-01-03 MED ORDER — VANCOMYCIN HCL 2000 MG/400ML IV SOLN
2000.0000 mg | Freq: Once | INTRAVENOUS | Status: DC
  Administered 2023-01-03: 2000 mg via INTRAVENOUS
  Filled 2023-01-03: qty 400

## 2023-01-03 MED ORDER — ONDANSETRON HCL 4 MG PO TABS: 4.0000 mg | ORAL_TABLET | Freq: Four times a day (QID) | ORAL | Status: DC | PRN

## 2023-01-03 MED ORDER — KETOROLAC TROMETHAMINE 15 MG/ML IJ SOLN
15.0000 mg | Freq: Once | INTRAMUSCULAR | Status: AC
  Administered 2023-01-03: 15 mg via INTRAVENOUS
  Filled 2023-01-03: qty 1

## 2023-01-03 MED ORDER — KETOROLAC TROMETHAMINE 15 MG/ML IJ SOLN
15.0000 mg | Freq: Four times a day (QID) | INTRAMUSCULAR | Status: DC | PRN
  Administered 2023-01-04: 15 mg via INTRAVENOUS
  Filled 2023-01-03: qty 1

## 2023-01-03 MED ORDER — ENOXAPARIN SODIUM 60 MG/0.6ML IJ SOSY
0.5000 mg/kg | PREFILLED_SYRINGE | INTRAMUSCULAR | Status: DC
  Administered 2023-01-03: 50 mg via SUBCUTANEOUS
  Filled 2023-01-03: qty 0.6

## 2023-01-03 MED ORDER — ACETAMINOPHEN 650 MG RE SUPP: 650.0000 mg | Freq: Four times a day (QID) | RECTAL | Status: DC | PRN

## 2023-01-03 MED ORDER — FERROUS SULFATE 325 (65 FE) MG PO TABS
325.0000 mg | ORAL_TABLET | Freq: Every day | ORAL | Status: DC
  Administered 2023-01-04: 325 mg via ORAL
  Filled 2023-01-03: qty 1

## 2023-01-03 MED ORDER — FAMOTIDINE 20 MG PO TABS
20.0000 mg | ORAL_TABLET | Freq: Two times a day (BID) | ORAL | Status: DC
  Administered 2023-01-03 – 2023-01-04 (×3): 20 mg via ORAL
  Filled 2023-01-03 (×3): qty 1

## 2023-01-03 MED ORDER — HYDROXYCHLOROQUINE SULFATE 200 MG PO TABS
200.0000 mg | ORAL_TABLET | Freq: Two times a day (BID) | ORAL | Status: DC
  Administered 2023-01-03 – 2023-01-04 (×2): 200 mg via ORAL
  Filled 2023-01-03 (×2): qty 1

## 2023-01-03 MED ORDER — LISINOPRIL 10 MG PO TABS
10.0000 mg | ORAL_TABLET | Freq: Every day | ORAL | Status: DC
  Administered 2023-01-04: 10 mg via ORAL
  Filled 2023-01-03: qty 1

## 2023-01-03 MED ORDER — POLYETHYLENE GLYCOL 3350 17 G PO PACK: 17.0000 g | PACK | Freq: Every day | ORAL | Status: DC | PRN

## 2023-01-03 MED ORDER — VANCOMYCIN HCL 1750 MG/350ML IV SOLN
1750.0000 mg | Freq: Once | INTRAVENOUS | Status: DC
  Filled 2023-01-03: qty 350

## 2023-01-03 MED ORDER — SODIUM CHLORIDE 0.9 % IV SOLN
2.0000 g | INTRAVENOUS | Status: DC
  Administered 2023-01-03 – 2023-01-04 (×2): 2 g via INTRAVENOUS
  Filled 2023-01-03 (×2): qty 20

## 2023-01-03 MED ORDER — ONDANSETRON HCL 4 MG/2ML IJ SOLN: 4.0000 mg | Freq: Four times a day (QID) | INTRAMUSCULAR | Status: DC | PRN

## 2023-01-03 MED ORDER — LORATADINE 10 MG PO TABS
10.0000 mg | ORAL_TABLET | Freq: Every day | ORAL | Status: DC
  Administered 2023-01-03 – 2023-01-04 (×2): 10 mg via ORAL
  Filled 2023-01-03 (×2): qty 1

## 2023-01-03 MED ORDER — SODIUM CHLORIDE 0.9 % IV BOLUS
1000.0000 mL | Freq: Once | INTRAVENOUS | Status: AC
  Administered 2023-01-03: 1000 mL via INTRAVENOUS

## 2023-01-03 MED ORDER — VANCOMYCIN HCL 1250 MG/250ML IV SOLN
1250.0000 mg | INTRAVENOUS | Status: DC
  Administered 2023-01-04: 1250 mg via INTRAVENOUS
  Filled 2023-01-03: qty 250

## 2023-01-03 NOTE — Progress Notes (Addendum)
Pharmacy Antibiotic Note  Mckenzie Webster is a 72 y.o. female admitted on 01/03/2023 with cellulitis of hand. Presented to ED on 2/5 and prescribed cephalexin. Represented to ED 3/6 with pain and swelling throughout hand and streaking up to [facility] fossa. WBC elevated, afebrile. Pharmacy has been consulted for vancomycin dosing.  Received vancomycin 2,000 mg loading dose  Plan: Vancomycin IV 1,250 mg every 24 hours Goal AUC 400-600 Estimated AUC 485.7, Cmin 10.9 Used Vd 0.5, IBW, Scr 0.93  Also on ceftriaxone 2 grams every 24 hours      Temp (24hrs), Avg:98.7 F (37.1 C), Min:98.7 F (37.1 C), Max:98.7 F (37.1 C)  Recent Labs  Lab 01/03/23 0831  WBC 14.9*  CREATININE 0.93  LATICACIDVEN 1.1    Estimated Creatinine Clearance: 69.1 mL/min (by C-G formula based on SCr of 0.93 mg/dL).    No Known Allergies  Antimicrobials this admission: ceftriaxone 3/6 >>  vancomycin 3/6 >>   Dose adjustments this admission: N/a  Microbiology results: None  Thank you for allowing pharmacy to be a part of this patient's care.  Glean Salvo, PharmD, BCPS Clinical Pharmacist  01/03/2023 10:38 AM

## 2023-01-03 NOTE — ED Provider Notes (Signed)
[facility] Provider Note    Event Date/Time   First MD Initiated Contact with Patient 01/03/23 0840     (approximate)   History   Hand Injury   HPI  Mckenzie Webster is a 72 y.o. female who presents today for reevaluation of her left hand injury.  Patient reports that 2 days ago she sliced her hand while opening a.  She was quite certain that there was nothing that got inside of her hand, and she declined x-ray yesterday.  She reports that after she left the emergency department, she continued to have pain.  However, there was no erythema or swelling until this morning.  She reports that this morning she awoke with a swollen painful hand with red streaks up her arm.  No fevers or chills.  She reports that she picked up the antibiotic as prescribed and has taken 3 doses.  Patient Active Problem List   Diagnosis Date Noted   Cellulitis of left hand 01/03/2023   Senile purpura ([place]) 06/20/2022   Class 1 obesity due to excess calories with serious comorbidity and body mass index (BMI) of 33.0 to 33.9 in adult 03/20/2022   Aortic atherosclerosis ([place]) 01/19/2021   Benign hypertensive renal disease 12/07/2020   Lung nodule seen on imaging study 09/21/2020   Depression, recurrent ([place]) 03/24/2020   Drug rash 06/04/2019   Osteoporosis 11/28/2017   Binge eating disorder 01/04/2016   Iron deficiency anemia    GERD without esophagitis 01/29/2015          Physical Exam   Triage Vital Signs: ED Triage Vitals  Enc Vitals Group     BP 01/03/23 0828 126/82     Pulse Rate 01/03/23 0828 90     Resp 01/03/23 0828 18     Temp 01/03/23 0828 98.7 F (37.1 C)     Temp Source 01/03/23 0828 Oral     SpO2 01/03/23 0828 99 %     Weight --      Height --      Head Circumference --      Peak Flow --      Pain Score 01/03/23 0829 8     Pain Loc --      Pain Edu? --      Excl. in [place]? --     Most recent vital signs: Vitals:   01/03/23 1040 01/03/23 1111  BP:  129/63 125/70  Pulse: (!) 50 85  Resp: 16 18  Temp: 98.2 F (36.8 C) 98.3 F (36.8 C)  SpO2: 99% 99%    Physical Exam Vitals and nursing note reviewed.  Constitutional:      General: Awake and alert. No acute distress.    Appearance: Normal appearance. The patient is normal weight.  HENT:     Head: Normocephalic and atraumatic.     Mouth: Mucous membranes are moist.  Eyes:     General: PERRL. Normal EOMs        Right eye: No discharge.        Left eye: No discharge.     Conjunctiva/sclera: Conjunctivae normal.  Cardiovascular:     Rate and Rhythm: Normal rate and regular rhythm.     Pulses: Normal pulses.  Pulmonary:     Effort: Pulmonary effort is normal. No respiratory distress.     Breath sounds: Normal breath sounds.  Abdominal:     Abdomen is soft. There is no abdominal tenderness. No rebound or guarding. No distention. Musculoskeletal:  General: No swelling. Normal range of motion.     Cervical back: Normal range of motion and neck supple.  Left hand: 1 cm laceration to the webspace between thumb and index finger with surrounding erythema extending to the dorsum of the hand, and palmar aspect of the hand.  There is lymphangitis extending to the [facility] fossa.  Compartments are soft and compressible throughout.  She is able to actively extend all of her fingers.  Normal radial pulse.  No drainage from her wound.  Normal intrinsic muscle function, though pain with tenderness to palpation throughout the dorsum and palmar aspect of her hand. Skin:    General: Skin is warm and dry.     Capillary Refill: Capillary refill takes less than 2 seconds.     Findings: No rash.  Neurological:     Mental Status: The patient is awake and alert.              ED Results / Procedures / Treatments   Labs (all labs ordered are listed, but only abnormal results are displayed) Labs Reviewed  COMPREHENSIVE METABOLIC PANEL - Abnormal; Notable for the following components:       Result Value   Sodium 134 (*)    Glucose, Bld 107 (*)    BUN 28 (*)    All other components within normal limits  CBC WITH DIFFERENTIAL/PLATELET - Abnormal; Notable for the following components:   WBC 14.9 (*)    Neutro Abs 12.9 (*)    Lymphs Abs 0.6 (*)    Monocytes Absolute 1.1 (*)    All other components within normal limits  LACTIC ACID, PLASMA  LACTIC ACID, PLASMA     EKG     RADIOLOGY I independently reviewed and interpreted imaging and agree with radiologists findings.     PROCEDURES:  Critical Care performed:   Procedures   MEDICATIONS ORDERED IN ED: Medications  cefTRIAXone (ROCEPHIN) 2 g in sodium chloride 0.9 % 100 mL IVPB (0 g Intravenous Stopped 01/03/23 1030)  enoxaparin (LOVENOX) injection 50 mg (has no administration in time range)  acetaminophen (TYLENOL) tablet 650 mg (has no administration in time range)    Or  acetaminophen (TYLENOL) suppository 650 mg (has no administration in time range)  ketorolac (TORADOL) 15 MG/ML injection 15 mg (has no administration in time range)  polyethylene glycol (MIRALAX / GLYCOLAX) packet 17 g (has no administration in time range)  ondansetron (ZOFRAN) tablet 4 mg (has no administration in time range)    Or  ondansetron (ZOFRAN) injection 4 mg (has no administration in time range)  hydroxychloroquine (PLAQUENIL) tablet 200 mg (has no administration in time range)  ferrous sulfate tablet 325 mg (has no administration in time range)  famotidine (PEPCID) tablet 20 mg (20 mg Oral Given 01/03/23 1308)  lisdexamfetamine (VYVANSE) capsule 60 mg (has no administration in time range)  lisinopril (ZESTRIL) tablet 10 mg (has no administration in time range)  loratadine (CLARITIN) tablet 10 mg (10 mg Oral Given 01/03/23 1308)  vancomycin (VANCOREADY) IVPB 1250 mg/250 mL (has no administration in time range)  ketorolac (TORADOL) 15 MG/ML injection 15 mg (15 mg Intravenous Given 01/03/23 1037)  sodium chloride 0.9 % bolus 1,000 mL (0  mLs Intravenous Stopped 01/03/23 1213)     IMPRESSION / MDM / ASSESSMENT AND PLAN / ED COURSE  I reviewed the triage vital signs and the nursing notes.   Differential diagnosis includes, but is not limited to, cellulitis, abscess, tenosynovitis, lymphangitis, SIRS/sepsis.  Patient presents emergency  department awake and alert, hemodynamically stable and afebrile.  I actually saw this patient yesterday, at which time she had a small scabbed over laceration with absolutely no surrounding erythema, swelling, fluctuance, or lymphangitis.  She was discharged home on Keflex.  Her hand has worsened significantly overnight.  She now has erythema, swelling throughout her hand, involving primarily the palmar portion, with streaking up to her AC fossa.  She remains afebrile.  No focal area of fluctuance to suggest abscess.  No specific tenderness along her flexor tendons, no fusiform swelling, do not suspect flexor tenosynovitis at this time.  Her compartments are soft and compressible throughout, not consistent with compartment syndrome.  No pain out of proportion.  She has normal radial pulse.  Labs obtained in triage are remarkable for a leukocytosis to [id number] with neutrophilic predominance. Lactate normal.  She was started on broad-spectrum antibiotics. Given how quickly this progressed from yesterday, I recommended admission for continued IV antibiotics.  No hemodynamic instability, fever, pain out of proportion, or gas on the x-ray to suggest necrotizing infection.  Patient agrees with plan of care. Patient was accepted by Dr. Reesa Chew with the hospitalist service.  Patient's presentation is most consistent with acute presentation with potential threat to life or bodily function.    FINAL CLINICAL IMPRESSION(S) / ED DIAGNOSES   Final diagnoses:  Cellulitis of hand, left     Rx / DC Orders   ED Discharge Orders     None        Note:  This document was prepared using Dragon voice recognition  software and may include unintentional dictation errors.   Emeline Gins 01/03/23 1314    Rada Hay, MD 01/03/23 1501

## 2023-01-03 NOTE — Plan of Care (Signed)

## 2023-01-03 NOTE — Assessment & Plan Note (Signed)
-  Continue home iron supplement

## 2023-01-03 NOTE — Assessment & Plan Note (Signed)
-  Continue home Pepcid

## 2023-01-03 NOTE — Progress Notes (Signed)
PHARMACIST - PHYSICIAN COMMUNICATION  CONCERNING:  Enoxaparin (Lovenox) for DVT Prophylaxis   DESCRIPTION: Patient was prescribed enoxaprin '40mg'$  q24 hours for VTE prophylaxis.   There were no vitals filed for this visit.  There is no height or weight on file to calculate BMI.  Estimated Creatinine Clearance: 69.1 mL/min (by C-G formula based on SCr of 0.93 mg/dL).   Based on [place] patient is candidate for enoxaparin 0.'5mg'$ /kg TBW SQ every 24 hours based on BMI being >30.   RECOMMENDATION: Pharmacy has adjusted enoxaparin dose per [facility] policy.  Patient is now receiving enoxaparin 50 mg every 24 hours    Vick Frees, PharmD Clinical Pharmacist  01/03/2023 10:19 AM

## 2023-01-03 NOTE — ED Triage Notes (Signed)
Patient to ED for left hand injury. Patient cut hand with kitchen knife on Monday. Seen here for same and sent home with antibiotic yesterday. New redness and increased swelling since yesterday.

## 2023-01-03 NOTE — Assessment & Plan Note (Signed)
-  Continue home Vyvanse

## 2023-01-03 NOTE — H&P (Addendum)
History and Physical    Patient: Mckenzie Webster [id number] DOB: 1951-01-16 DOA: 01/03/2023 DOS: the patient was seen and examined on 01/03/2023 PCP: Valerie Roys, DO  Patient coming from: Home  Chief Complaint:  Chief Complaint  Patient presents with   Hand Injury   HPI: Mckenzie Webster is a 72 y.o. female with medical history significant of hypertension, iron deficiency anemia and depression came to ED with concern of worsening pain, edema and erythema of right hand.  Patient was seen in ED yesterday after having a cut in between thumb and index finger while opening a can.  She declined any imaging yesterday, she was given Keflex as there was no erythema or edema at that time and discharged home. She woke up this morning with significant pain and swollen and and redness going up to forearm.  He has taken 3 doses of Keflex but came back to ED for evaluation as symptoms continue to get worse.  Denies any fever or chills.  Patient denies any other complaints or symptoms.  ED course.  Hemodynamically stable.  Afebrile.  Labs without any significant abnormality.  Significant edema, erythema involving right hand and forearm.  No obvious drainage from the injury.  X-ray with soft tissue swelling with no soft tissue gas, fracture or radiopaque foreign body seen.  Admission was requested for 1 day of IV antibiotics.  She was started on Rocephin and vancomycin by EDP.  Review of Systems: As mentioned in the history of present illness. All other systems reviewed and are negative. Past Medical History:  Diagnosis Date   Anemia    Cataract    Dry eye syndrome    Positive colorectal cancer screening using Cologuard test 02/14/2017   Stomach ulcer    Past Surgical History:  Procedure Laterality Date   COLONOSCOPY WITH PROPOFOL N/A 04/27/2017   Procedure: COLONOSCOPY WITH PROPOFOL;  Surgeon: Jonathon Bellows, MD;  Location: [facility] ENDOSCOPY;  Service: Endoscopy;  Laterality: N/A;   COLONOSCOPY WITH  PROPOFOL N/A 05/09/2021   Procedure: COLONOSCOPY WITH PROPOFOL;  Surgeon: Jonathon Bellows, MD;  Location: [facility] ENDOSCOPY;  Service: Gastroenterology;  Laterality: N/A;   EYE SURGERY     Cataract   FOOT SURGERY     Change the alignment of the toes to stop the formation of corns   gastric bypass  1981   Social History:  reports that she quit smoking about 33 years ago. Her smoking use included cigarettes. She has never used smokeless tobacco. She reports current alcohol use. She reports that she does not use drugs.  No Known Allergies  Family History  Problem Relation Age of Onset   Heart disease Mother    COPD Mother    Heart disease Father    Hypertension Father    Heart disease Maternal Grandfather    Alzheimer's disease Paternal Grandmother    Breast cancer Neg Hx     Prior to Admission medications   Medication Sig Start Date End Date Taking? Authorizing Provider  albuterol (VENTOLIN HFA) 108 (90 Base) MCG/ACT inhaler Inhale 2 puffs into the lungs every 6 (six) hours as needed for wheezing or shortness of breath. 06/22/22  Yes Johnson, Megan P, DO  benzonatate (TESSALON) 200 MG capsule Take 1 capsule (200 mg total) by mouth 2 (two) times daily as needed for cough. 12/28/22  Yes Mecum, Erin E, PA-C  cephALEXin (KEFLEX) 500 MG capsule Take 1 capsule (500 mg total) by mouth 4 (four) times daily for 7 days. 01/02/23 01/09/23  Yes Poggi, Jenna E, PA-C  cetirizine (ZYRTEC) 10 MG tablet Take 10 mg by mouth daily.   Yes [provider]  famotidine (PEPCID) 20 MG tablet Take 1 tablet (20 mg total) by mouth 2 (two) times daily. 12/12/22  Yes Johnson, Megan P, DO  ferrous sulfate 325 (65 FE) MG EC tablet Take 1 tablet (325 mg total) by mouth daily. 06/22/22  Yes Johnson, Megan P, DO  fluticasone (FLONASE) 50 MCG/ACT nasal spray Place 2 sprays into both nostrils daily.    Yes [provider]  hydroxychloroquine (PLAQUENIL) 200 MG tablet Take 200 mg by mouth 2 (two) times daily. 12/03/20  Yes  [provider]  lisdexamfetamine (VYVANSE) 60 MG capsule Take 1 capsule (60 mg total) by mouth every morning. 12/12/22 01/11/23 Yes Johnson, Megan P, DO  lisinopril (ZESTRIL) 10 MG tablet Take 1 tablet (10 mg total) by mouth daily. 12/12/22  Yes Johnson, Megan P, DO  naproxen (NAPROSYN) 500 MG tablet Take 1 tablet (500 mg total) by mouth 2 (two) times daily with a meal. 09/08/22  Yes Johnson, Megan P, DO  lisdexamfetamine (VYVANSE) 60 MG capsule Take 1 capsule (60 mg total) by mouth every morning. 02/10/23 03/12/23  Valerie Roys, DO    Physical Exam: Vitals:   01/03/23 0828 01/03/23 1040 01/03/23 1111 01/03/23 1443  BP: 126/82 129/63 125/70 112/65  Pulse: 90 (!) 50 85 77  Resp: '18 16 18 17  '$ Temp: 98.7 F (37.1 C) 98.2 F (36.8 C) 98.3 F (36.8 C) 97.8 F (36.6 C)  TempSrc: Oral Oral Oral   SpO2: 99% 99% 99% 100%   Vitals:   01/03/23 0828 01/03/23 1040 01/03/23 1111 01/03/23 1443  BP: 126/82 129/63 125/70 112/65  Pulse: 90 (!) 50 85 77  Resp: '18 16 18 17  '$ Temp: 98.7 F (37.1 C) 98.2 F (36.8 C) 98.3 F (36.8 C) 97.8 F (36.6 C)  TempSrc: Oral Oral Oral   SpO2: 99% 99% 99% 100%   General: Vital signs reviewed.  Patient is well-developed and well-nourished, in no acute distress and cooperative with exam.  Head: Normocephalic and atraumatic. Eyes: EOMI, conjunctivae normal, no scleral icterus.  Neck: Supple, trachea midline, normal ROM,  Cardiovascular: RRR, S1 normal, S2 normal, no murmurs, gallops, or rubs. Pulmonary/Chest: Clear to auscultation bilaterally, no wheezes, rales, or rhonchi. Abdominal: Soft, non-tender, non-distended, BS +, Extremities: No lower extremity edema bilaterally,  pulses symmetric and intact bilaterally. No cyanosis or clubbing. Neurological: A&O x3, Strength is normal and symmetric bilaterally, cranial nerve II-XII are grossly intact, no focal motor deficit, sensory intact to light touch bilaterally.  Skin: Significant edema, erythema  involving left hand, more around thumb and index finger, extending up to forearm. Psychiatric: Normal mood and affect.    Data Reviewed: Prior data and labs reviewed.  Assessment and Plan: * Cellulitis of left hand Secondary to accidental knife cut.  Imaging and exam with concern of cellulitis -Rocephin and vancomycin -Toradol and Tylenol for pain -Continue to monitor  Essential hypertension -Continue home lisinopril  Iron deficiency anemia -Continue home iron supplement  GERD without esophagitis -Continue home Pepcid  Depression, recurrent ([place]) -Continue home Vyvanse    Advance Care Planning:   Code Status: Full Code discussed with patient  Consults: None  Family Communication: No family at bedside  Severity of Illness: The appropriate patient status for this patient is OBSERVATION. Observation status is judged to be reasonable and necessary in order to provide the required intensity of service to ensure  the patient's safety. The patient's presenting symptoms, physical exam findings, and initial radiographic and laboratory data in the context of their medical condition is felt to place them at decreased risk for further clinical deterioration. Furthermore, it is anticipated that the patient will be medically stable for discharge from the hospital within 2 midnights of admission.   This record has been created using Systems analyst. Errors have been sought and corrected,but may not always be located. Such creation errors do not reflect on the standard of care.   Author: Lorella Nimrod, MD 01/03/2023 5:12 PM  For on call review [email or web address].

## 2023-01-03 NOTE — Consult Note (Signed)
PHARMACY -  BRIEF ANTIBIOTIC NOTE   Pharmacy has received consult(s) for vancomycin from an ED provider.  The patient's profile has been reviewed for ht/wt/allergies/indication/available labs.    One time order(s) placed for vancomycin 2 gm IV  Further antibiotics/pharmacy consults should be ordered by admitting physician if indicated.                       Thank you, Alison Murray 01/03/2023  9:15 AM

## 2023-01-03 NOTE — Assessment & Plan Note (Addendum)
Secondary to accidental knife cut.  Imaging and exam with concern of cellulitis -Rocephin and vancomycin -Toradol and Tylenol for pain -Continue to monitor

## 2023-01-03 NOTE — TOC Progression Note (Signed)
Transition of Care [facility]) - Progression Note    Patient Details  Name: Mckenzie Webster MRN: [id number] Date of Birth: [birth year]  Transition of Care [facility]) CM/SW [place], RN Phone Number: 01/03/2023, 2:57 PM  Clinical Narrative:     Transition of Care (TOC) Screening Note   Patient Details  Name: Mckenzie Webster Date of Birth: Aug 30, 1951   Transition of Care [facility]) CM/SW Contact:    Conception Oms, RN Phone Number: 01/03/2023, 2:57 PM    Transition of Care Department [facility]) has reviewed patient and no TOC needs have been identified at this time. We will continue to monitor patient advancement through interdisciplinary progression rounds. If new patient transition needs arise, please place a TOC consult.     Expected Discharge Plan: Home/Self Care Barriers to Discharge: No Barriers Identified  Expected Discharge Plan and Services       Living arrangements for the past 2 months: Single Family Home                                       Social Determinants of Health (SDOH) Interventions SDOH Screenings   Food Insecurity: No Food Insecurity (01/03/2023)  Housing: Low Risk  (01/03/2023)  Transportation Needs: No Transportation Needs (01/03/2023)  Utilities: Not At Risk (01/03/2023)  Alcohol Screen: Low Risk  (12/26/2022)  Depression (PHQ2-9): Low Risk  (12/26/2022)  Financial Resource Strain: Low Risk  (12/26/2022)  Physical Activity: Inactive (12/26/2022)  Social Connections: Moderately Isolated (12/26/2022)  Stress: No Stress Concern Present (12/26/2022)  Tobacco Use: Medium Risk (01/03/2023)    Readmission Risk Interventions     No data to display

## 2023-01-03 NOTE — Assessment & Plan Note (Signed)
-  Continue home lisinopril

## 2023-01-04 DIAGNOSIS — D509 Iron deficiency anemia, unspecified: Secondary | ICD-10-CM | POA: Diagnosis not present

## 2023-01-04 DIAGNOSIS — L03114 Cellulitis of left upper limb: Secondary | ICD-10-CM | POA: Diagnosis not present

## 2023-01-04 DIAGNOSIS — I10 Essential (primary) hypertension: Secondary | ICD-10-CM | POA: Diagnosis not present

## 2023-01-04 DIAGNOSIS — K219 Gastro-esophageal reflux disease without esophagitis: Secondary | ICD-10-CM

## 2023-01-04 DIAGNOSIS — F339 Major depressive disorder, recurrent, unspecified: Secondary | ICD-10-CM

## 2023-01-04 LAB — CBC
HCT: 37.6 % (ref 36.0–46.0)
Hemoglobin: 12.3 g/dL (ref 12.0–15.0)
MCH: 29.1 pg (ref 26.0–34.0)
MCHC: 32.7 g/dL (ref 30.0–36.0)
MCV: 88.9 fL (ref 80.0–100.0)
Platelets: 236 10*3/uL (ref 150–400)
RBC: 4.23 MIL/uL (ref 3.87–5.11)
RDW: 13.2 % (ref 11.5–15.5)
WBC: 11 10*3/uL — ABNORMAL HIGH (ref 4.0–10.5)
nRBC: 0 % (ref 0.0–0.2)

## 2023-01-04 LAB — BASIC METABOLIC PANEL
Anion gap: 8 (ref 5–15)
BUN: 29 mg/dL — ABNORMAL HIGH (ref 8–23)
CO2: 22 mmol/L (ref 22–32)
Calcium: 8.4 mg/dL — ABNORMAL LOW (ref 8.9–10.3)
Chloride: 106 mmol/L (ref 98–111)
Creatinine, Ser: 0.87 mg/dL (ref 0.44–1.00)
GFR, Estimated: >60 mL/min (ref >60)
Glucose, Bld: 98 mg/dL (ref 70–99)
Potassium: 3.7 mmol/L (ref 3.5–5.1)
Sodium: 136 mmol/L (ref 135–145)

## 2023-01-04 MED ORDER — NAPROXEN 500 MG PO TABS: 500.0000 mg | ORAL_TABLET | Freq: Two times a day (BID) | ORAL | 2 refills | Status: DC

## 2023-01-04 MED ORDER — AMOXICILLIN-POT CLAVULANATE 875-125 MG PO TABS: 1.0000 | ORAL_TABLET | Freq: Two times a day (BID) | ORAL | 0 refills | Status: AC

## 2023-01-04 MED ORDER — DOXYCYCLINE HYCLATE 100 MG PO TABS: 100.0000 mg | ORAL_TABLET | Freq: Two times a day (BID) | ORAL | 0 refills | Status: AC

## 2023-01-04 NOTE — Discharge Summary (Signed)
Physician Discharge Summary   Patient: Mckenzie Webster MRN: [id number] DOB: [birth year]  Admit date:     01/03/2023  Discharge date: 01/04/23  Discharge Physician: Lorella Nimrod   PCP: Valerie Roys, DO   Recommendations at discharge:  Please obtain CBC and BMP in 1 week Patient is being discharged on 1 week of Augmentin and doxycycline-Ensure completion of course. Patient follow-up with primary care provider within a week  Discharge Diagnoses: Principal Problem:   Cellulitis of left hand Active Problems:   Essential hypertension   Iron deficiency anemia   GERD without esophagitis   Depression, recurrent [facility])   Hospital Course:  Mckenzie Webster is a 72 y.o. female with medical history significant of hypertension, iron deficiency anemia and depression came to ED with concern of worsening pain, edema and erythema of right hand.   Patient was seen in ED yesterday after having a cut in between thumb and index finger while opening a can.  She declined any imaging yesterday, she was given Keflex as there was no erythema or edema at that time and discharged home. She woke up this morning with significant pain and swollen and and redness going up to forearm.  He has taken 3 doses of Keflex but came back to ED for evaluation as symptoms continue to get worse.  Denies any fever or chills.  ED course.  Hemodynamically stable.  Afebrile.  Labs without any significant abnormality.  Significant edema, erythema involving right hand and forearm.  No obvious drainage from the injury.  X-ray with soft tissue swelling with no soft tissue gas, fracture or radiopaque foreign body seen.   Patient was started on ceftriaxone and vancomycin for 1 day.  3/7: Patient remained stable, afebrile.  Improvement of forearm cellulitis.  Left hand continued to have erythema and edema.  Wound without any discharge and seems healing.  Patient is being discharged on Augmentin and doxycycline for broader coverage.  She  received 2 days worth of ceftriaxone and vancomycin.  She will stop taking Keflex.  Patient will continue with rest of her home medications and need to have a close follow-up with her providers for further recommendations.    Assessment and Plan: * Cellulitis of left hand Secondary to accidental knife cut.  Imaging and exam with concern of cellulitis -Rocephin and vancomycin -Toradol and Tylenol for pain -Continue to monitor  Essential hypertension -Continue home lisinopril  Iron deficiency anemia -Continue home iron supplement  GERD without esophagitis -Continue home Pepcid  Depression, recurrent ([place]) -Continue home Vyvanse   Consultants: None Procedures performed: None Disposition: Home Diet recommendation:  Discharge Diet Orders (From admission, onward)     Start     Ordered   01/04/23 0000  Diet - low sodium heart healthy        01/04/23 1056           Cardiac diet DISCHARGE MEDICATION: Allergies as of 01/04/2023   No Known Allergies      Medication List     STOP taking these medications    cephALEXin 500 MG capsule Commonly known as: KEFLEX       TAKE these medications    albuterol 108 (90 Base) MCG/ACT inhaler Commonly known as: VENTOLIN HFA Inhale 2 puffs into the lungs every 6 (six) hours as needed for wheezing or shortness of breath.   amoxicillin-clavulanate 875-125 MG tablet Commonly known as: AUGMENTIN Take 1 tablet by mouth 2 (two) times daily for 7 days.   benzonatate 200 MG  capsule Commonly known as: TESSALON Take 1 capsule (200 mg total) by mouth 2 (two) times daily as needed for cough.   cetirizine 10 MG tablet Commonly known as: ZYRTEC Take 10 mg by mouth daily.   doxycycline 100 MG tablet Commonly known as: VIBRA-TABS Take 1 tablet (100 mg total) by mouth 2 (two) times daily for 7 days.   famotidine 20 MG tablet Commonly known as: PEPCID Take 1 tablet (20 mg total) by mouth 2 (two) times daily.   ferrous sulfate  325 (65 FE) MG EC tablet Take 1 tablet (325 mg total) by mouth daily.   fluticasone 50 MCG/ACT nasal spray Commonly known as: FLONASE Place 2 sprays into both nostrils daily.   hydroxychloroquine 200 MG tablet Commonly known as: PLAQUENIL Take 200 mg by mouth 2 (two) times daily.   lisdexamfetamine 60 MG capsule Commonly known as: Vyvanse Take 1 capsule (60 mg total) by mouth every morning.   lisdexamfetamine 60 MG capsule Commonly known as: Vyvanse Take 1 capsule (60 mg total) by mouth every morning. Start taking on: February 10, 2023   lisinopril 10 MG tablet Commonly known as: ZESTRIL Take 1 tablet (10 mg total) by mouth daily.   naproxen 500 MG tablet Commonly known as: Naprosyn Take 1 tablet (500 mg total) by mouth 2 (two) times daily with a meal. For next few days and then as needed What changed: additional instructions               Discharge Care Instructions  (From admission, onward)           Start     Ordered   01/04/23 0000  No dressing needed        01/04/23 1056            Follow-up Information     Johnson, Megan P, DO. Go on 01/05/2023.   Specialty: Family Medicine Why: Appt @ 11:20 am Contact information: [place] [address] [phone]                Discharge Exam: There were no vitals filed for this visit. General.     In no acute distress. Pulmonary.  Lungs clear bilaterally, normal respiratory effort. CV.  Regular rate and rhythm, no JVD, rub or murmur. Abdomen.  Soft, nontender, nondistended, BS positive. CNS.  Alert and oriented .  No focal neurologic deficit. Extremities.  Significant edema and erythema of left hand, improved erythema of forearm. Psychiatry.  Judgment and insight appears normal.   Condition at discharge: stable  The results of significant diagnostics from this hospitalization (including imaging, microbiology, ancillary and laboratory) are listed below for reference.   Imaging  Studies: DG Hand Complete Left  Result Date: 01/03/2023 CLINICAL DATA:  New left hand redness and increased swelling at the location of a knife laceration which occurred 2 days ago and treated with an antibiotic starting yesterday. EXAM: LEFT HAND - COMPLETE 3+ VIEW COMPARISON:  None Available. FINDINGS: There was limitation in positioning due to inability of the patient to fully cooperate for the images. The fingers are not adequately visualized due to flexion on the PA and oblique images despite trying to straighten the fingers. Degenerative changes involving multiple interphalangeal joints and marked degenerative changes at the 1st metacarpal/carpal joint. There is mild soft tissue swelling at the level of the MCP joints with no soft tissue gas, fracture or radiopaque foreign body seen. Mild shortening of the distal ulna relative to the distal radius.  IMPRESSION: 1. Mild soft tissue swelling at the level of the MCP joints with no soft tissue gas, fracture or radiopaque foreign body seen. 2. Multi joint degenerative changes. 3. Mild negative ulnar variance. Electronically Signed   By: Claudie Revering M.D.   On: 01/03/2023 09:22   DG Foot Complete Right  Result Date: 01/02/2023 Please see detailed radiograph report in office note.   Microbiology: Results for orders placed or performed during the hospital encounter of 12/24/21  Novel Coronavirus, NAA ([facility])     Status: None   Collection Time: 12/24/21  1:49 PM   Specimen: Nasopharyngeal Swab; Nasopharyngeal(NP) swabs in vial transport medium   Nasopharynge  Patient  Result Value Ref Range Status   SARS-CoV-2, NAA Not Detected Not Detected Final    Comment: This nucleic acid amplification test was developed and its performance characteristics determined by [employer]. Nucleic acid amplification tests include RT-PCR and TMA. This test has not been FDA cleared or approved. This test has been authorized by FDA under an Emergency Use  Authorization (EUA). This test is only authorized for the duration of time the declaration that circumstances exist justifying the authorization of the emergency use of in vitro diagnostic tests for detection of SARS-CoV-2 virus and/or diagnosis of COVID-19 infection under section 564(b)(1) of the Act, 21 U.S.C. [id number]) (1), unless the authorization is terminated or revoked sooner. When diagnostic testing is negative, the possibility of a false negative result should be considered in the context of a patient's recent exposures and the presence of clinical signs and symptoms consistent with COVID-19. An individual without symptoms of COVID-19 and who is not shedding SARS-CoV-2 virus wo uld expect to have a negative (not detected) result in this assay.     Labs: CBC: Recent Labs  Lab 01/03/23 0831 01/04/23 0239  WBC 14.9* 11.0*  NEUTROABS 12.9*  --   HGB 13.8 12.3  HCT 42.2 37.6  MCV 88.3 88.9  PLT 253 [id number]   Basic Metabolic Panel: Recent Labs  Lab 01/03/23 0831 01/04/23 0239  NA 134* 136  K 3.9 3.7  CL 101 106  CO2 25 22  GLUCOSE 107* 98  BUN 28* 29*  CREATININE 0.93 0.87  CALCIUM 9.0 8.4*   Liver Function Tests: Recent Labs  Lab 01/03/23 0831  AST 19  ALT 15  ALKPHOS 98  BILITOT 1.1  PROT 7.2  ALBUMIN 3.7   CBG: No results for input(s): "GLUCAP" in the last 168 hours.  Discharge time spent: greater than 30 minutes.  This record has been created using Systems analyst. Errors have been sought and corrected,but may not always be located. Such creation errors do not reflect on the standard of care.   Signed: Lorella Nimrod, MD [facility] 01/04/2023

## 2023-01-04 NOTE — Hospital Course (Signed)
Mckenzie Webster is a 72 y.o. female with medical history significant of hypertension, iron deficiency anemia and depression came to ED with concern of worsening pain, edema and erythema of right hand.   Patient was seen in ED yesterday after having a cut in between thumb and index finger while opening a can.  She declined any imaging yesterday, she was given Keflex as there was no erythema or edema at that time and discharged home. She woke up this morning with significant pain and swollen and and redness going up to forearm.  He has taken 3 doses of Keflex but came back to ED for evaluation as symptoms continue to get worse.  Denies any fever or chills.  ED course.  Hemodynamically stable.  Afebrile.  Labs without any significant abnormality.  Significant edema, erythema involving right hand and forearm.  No obvious drainage from the injury.  X-ray with soft tissue swelling with no soft tissue gas, fracture or radiopaque foreign body seen.   Patient was started on ceftriaxone and vancomycin for 1 day.  3/7: Patient remained stable, afebrile.  Improvement of forearm cellulitis.  Left hand continued to have erythema and edema.  Wound without any discharge and seems healing.  Patient is being discharged on Augmentin and doxycycline for broader coverage.  She received 2 days worth of ceftriaxone and vancomycin.  She will stop taking Keflex.  Patient will continue with rest of her home medications and need to have a close follow-up with her providers for further recommendations.

## 2023-01-04 NOTE — Plan of Care (Signed)

## 2023-01-05 ENCOUNTER — Inpatient Hospital Stay: Payer: PPO | Admitting: Family Medicine

## 2023-01-08 ENCOUNTER — Encounter: Payer: Self-pay | Admitting: Family Medicine

## 2023-01-08 ENCOUNTER — Ambulatory Visit (INDEPENDENT_AMBULATORY_CARE_PROVIDER_SITE_OTHER): Payer: PPO | Admitting: Family Medicine

## 2023-01-08 VITALS — BP 127/83 | HR 76 | Temp 98.4°F | Ht 69.0 in | Wt 217.8 lb

## 2023-01-08 DIAGNOSIS — L03114 Cellulitis of left upper limb: Secondary | ICD-10-CM

## 2023-01-08 NOTE — Assessment & Plan Note (Signed)
Resolved. Still has 3-4 days left on her antibiotics. Finish antibiotics. Will check BMP and CBC. Call with any concerns.

## 2023-01-08 NOTE — Progress Notes (Signed)
BP 127/83   Pulse 76   Temp 98.4 F (36.9 C) (Oral)   Ht '5\' 9"'$  (1.753 m)   Wt 217 lb 12.8 oz (98.8 kg)   SpO2 98%   BMI 32.16 kg/m    Subjective:    Patient ID: Mckenzie Webster, female    DOB: [birth year], 72 y.o.   MRN: [id number]  HPI: Mckenzie Webster is a 72 y.o. female  Chief Complaint  Patient presents with   Hospitalization Follow-up   Transition of [facility] Follow up.   Hospital/Facility: [facility] D/C Physician: Dr. Reesa Chew  D/C Date: 01/04/23  Records Requested: 01/08/23 Records Received: 01/08/23 Records Reviewed: 01/08/23  Diagnoses on Discharge:   Cellulitis of left hand   Essential hypertension   Iron deficiency anemia   GERD without esophagitis   Depression, recurrent ([place])  Date of interactive Contact within 48 hours of discharge: 01/08/23 Contact was through: direct  Date of 7 day or 14 day face-to-face visit:  01/08/23  within 7 days  Outpatient Encounter Medications as of 01/08/2023  Medication Sig   albuterol (VENTOLIN HFA) 108 (90 Base) MCG/ACT inhaler Inhale 2 puffs into the lungs every 6 (six) hours as needed for wheezing or shortness of breath.   amoxicillin-clavulanate (AUGMENTIN) 875-125 MG tablet Take 1 tablet by mouth 2 (two) times daily for 7 days.   cetirizine (ZYRTEC) 10 MG tablet Take 10 mg by mouth daily.   doxycycline (VIBRA-TABS) 100 MG tablet Take 1 tablet (100 mg total) by mouth 2 (two) times daily for 7 days.   famotidine (PEPCID) 20 MG tablet Take 1 tablet (20 mg total) by mouth 2 (two) times daily.   ferrous sulfate 325 (65 FE) MG EC tablet Take 1 tablet (325 mg total) by mouth daily.   fluticasone (FLONASE) 50 MCG/ACT nasal spray Place 2 sprays into both nostrils daily.    hydroxychloroquine (PLAQUENIL) 200 MG tablet Take 200 mg by mouth 2 (two) times daily.   lisdexamfetamine (VYVANSE) 60 MG capsule Take 1 capsule (60 mg total) by mouth every morning.   [START ON 02/10/2023] lisdexamfetamine (VYVANSE) 60 MG capsule Take 1 capsule (60 mg  total) by mouth every morning.   lisinopril (ZESTRIL) 10 MG tablet Take 1 tablet (10 mg total) by mouth daily.   naproxen (NAPROSYN) 500 MG tablet Take 1 tablet (500 mg total) by mouth 2 (two) times daily with a meal. For next few days and then as needed   [DISCONTINUED] benzonatate (TESSALON) 200 MG capsule Take 1 capsule (200 mg total) by mouth 2 (two) times daily as needed for cough. (Patient not taking: Reported on 01/08/2023)   No facility-administered encounter medications on file as of 01/08/2023.  Per Hospitalist: "Hospital Course:  Mckenzie Webster is a 72 y.o. female with medical history significant of hypertension, iron deficiency anemia and depression came to ED with concern of worsening pain, edema and erythema of right hand.   Patient was seen in ED yesterday after having a cut in between thumb and index finger while opening a can.  She declined any imaging yesterday, she was given Keflex as there was no erythema or edema at that time and discharged home. She woke up this morning with significant pain and swollen and and redness going up to forearm.  He has taken 3 doses of Keflex but came back to ED for evaluation as symptoms continue to get worse.  Denies any fever or chills.   ED course.  Hemodynamically stable.  Afebrile.  Labs without any significant abnormality.  Significant edema, erythema involving right hand and forearm.  No obvious drainage from the injury.  X-ray with soft tissue swelling with no soft tissue gas, fracture or radiopaque foreign body seen.    Patient was started on ceftriaxone and vancomycin for 1 day.   3/7: Patient remained stable, afebrile.  Improvement of forearm cellulitis.  Left hand continued to have erythema and edema.  Wound without any discharge and seems healing.   Patient is being discharged on Augmentin and doxycycline for broader coverage.  She received 2 days worth of ceftriaxone and vancomycin.  She will stop taking Keflex.   Patient will  continue with rest of her home medications and need to have a close follow-up with her providers for further recommendations.    Assessment and Plan: * Cellulitis of left hand Secondary to accidental knife cut.  Imaging and exam with concern of cellulitis -Rocephin and vancomycin -Toradol and Tylenol for pain -Continue to monitor   Essential hypertension -Continue home lisinopril   Iron deficiency anemia -Continue home iron supplement   GERD without esophagitis -Continue home Pepcid   Depression, recurrent ([place]) -Continue home Vyvanse"  Diagnostic Tests Reviewed:  Narrative & Impression  CLINICAL DATA:  New left hand redness and increased swelling at the location of a knife laceration which occurred 2 days ago and treated with an antibiotic starting yesterday.   EXAM: LEFT HAND - COMPLETE 3+ VIEW   COMPARISON:  None Available.   FINDINGS: There was limitation in positioning due to inability of the patient to fully cooperate for the images. The fingers are not adequately visualized due to flexion on the PA and oblique images despite trying to straighten the fingers. Degenerative changes involving multiple interphalangeal joints and marked degenerative changes at the 1st metacarpal/carpal joint. There is mild soft tissue swelling at the level of the MCP joints with no soft tissue gas, fracture or radiopaque foreign body seen. Mild shortening of the distal ulna relative to the distal radius.   IMPRESSION: 1. Mild soft tissue swelling at the level of the MCP joints with no soft tissue gas, fracture or radiopaque foreign body seen. 2. Multi joint degenerative changes. 3. Mild negative ulnar variance.   Disposition: Home  Consults: None  Discharge Instructions:  Please obtain CBC and BMP in 1 week Patient is being discharged on 1 week of Augmentin and doxycycline-Ensure completion of course. Patient follow-up with primary care provider within a  week  Disease/illness Education: Discussed today  Home Health/Community Services Discussions/Referrals: N/A  Establishment or re-establishment of referral orders for community resources: N/A  Discussion with other health care providers: N/A  Assessment and Support of treatment regimen adherence: Excellent  Appointments Coordinated with: Patient  Education for self-management, independent living, and ADLs: Discussed today  Since getting out of the hospital, she has been doing well. She has been noticing that the swelling is going down. Still can't entirely move her fingers. She has been feeling a bit tired. No fevers. Tolerating her medicine well, but she is having some diarrhea. She is going to have surgery for the arthritis in her foot. She is otherwise feeling well. No other concerns or complaints at this time.   Relevant past medical, surgical, family and social history reviewed and updated as indicated. Interim medical history since our last visit reviewed. Allergies and medications reviewed and updated.  Review of Systems  Constitutional: Negative.   Respiratory: Negative.    Cardiovascular: Negative.   Musculoskeletal: Negative.  Skin:  Positive for wound. Negative for color change, pallor and rash.  Hematological: Negative.   Psychiatric/Behavioral: Negative.      Per HPI unless specifically indicated above     Objective:    BP 127/83   Pulse 76   Temp 98.4 F (36.9 C) (Oral)   Ht '5\' 9"'$  (1.753 m)   Wt 217 lb 12.8 oz (98.8 kg)   SpO2 98%   BMI 32.16 kg/m   Wt Readings from Last 3 Encounters:  01/08/23 217 lb 12.8 oz (98.8 kg)  01/02/23 216 lb (98 kg)  12/28/22 219 lb (99.3 kg)    Physical Exam Vitals and nursing note reviewed.  Constitutional:      General: She is not in acute distress.    Appearance: Normal appearance. She is obese. She is not ill-appearing, toxic-appearing or diaphoretic.  HENT:     Head: Normocephalic and atraumatic.     Right Ear:  External ear normal.     Left Ear: External ear normal.     Nose: Nose normal.     Mouth/Throat:     Mouth: Mucous membranes are moist.     Pharynx: Oropharynx is clear.  Eyes:     General: No scleral icterus.       Right eye: No discharge.        Left eye: No discharge.     Extraocular Movements: Extraocular movements intact.     Conjunctiva/sclera: Conjunctivae normal.     Pupils: Pupils are equal, round, and reactive to light.  Cardiovascular:     Rate and Rhythm: Normal rate and regular rhythm.     Pulses: Normal pulses.     Heart sounds: Normal heart sounds. No murmur heard.    No friction rub. No gallop.  Pulmonary:     Effort: Pulmonary effort is normal. No respiratory distress.     Breath sounds: Normal breath sounds. No stridor. No wheezing, rhonchi or rales.  Chest:     Chest wall: No tenderness.  Musculoskeletal:        General: Normal range of motion.     Cervical back: Normal range of motion and neck supple.  Skin:    General: Skin is warm and dry.     Capillary Refill: Capillary refill takes less than 2 seconds.     Coloration: Skin is not jaundiced or pale.     Findings: No bruising, erythema, lesion or rash.     Comments: Well healing wound on L hand   Neurological:     General: No focal deficit present.     Mental Status: She is alert and oriented to person, place, and time. Mental status is at baseline.  Psychiatric:        Mood and Affect: Mood normal.        Behavior: Behavior normal.        Thought Content: Thought content normal.        Judgment: Judgment normal.     Results for orders placed or performed during the hospital encounter of 01/03/23  Lactic acid, plasma  Result Value Ref Range   Lactic Acid, Venous 1.1 0.5 - 1.9 mmol/L  Lactic acid, plasma  Result Value Ref Range   Lactic Acid, Venous 1.3 0.5 - 1.9 mmol/L  Comprehensive metabolic panel  Result Value Ref Range   Sodium 134 (L) 135 - 145 mmol/L   Potassium 3.9 3.5 - 5.1 mmol/L    Chloride 101 98 - 111 mmol/L   CO2 25  22 - 32 mmol/L   Glucose, Bld 107 (H) 70 - 99 mg/dL   BUN 28 (H) 8 - 23 mg/dL   Creatinine, Ser 0.93 0.44 - 1.00 mg/dL   Calcium 9.0 8.9 - 10.3 mg/dL   Total Protein 7.2 6.5 - 8.1 g/dL   Albumin 3.7 3.5 - 5.0 g/dL   AST 19 15 - 41 U/L   ALT 15 0 - 44 U/L   Alkaline Phosphatase 98 38 - 126 U/L   Total Bilirubin 1.1 0.3 - 1.2 mg/dL   GFR, Estimated >60 >60 mL/min   Anion gap 8 5 - 15  CBC with Differential  Result Value Ref Range   WBC 14.9 (H) 4.0 - 10.5 K/uL   RBC 4.78 3.87 - 5.11 MIL/uL   Hemoglobin 13.8 12.0 - 15.0 g/dL   HCT 42.2 36.0 - 46.0 %   MCV 88.3 80.0 - 100.0 fL   MCH 28.9 26.0 - 34.0 pg   MCHC 32.7 30.0 - 36.0 g/dL   RDW 13.0 11.5 - 15.5 %   Platelets 253 150 - 400 K/uL   nRBC 0.0 0.0 - 0.2 %   Neutrophils Relative % 87 %   Neutro Abs 12.9 (H) 1.7 - 7.7 K/uL   Lymphocytes Relative 4 %   Lymphs Abs 0.6 (L) 0.7 - 4.0 K/uL   Monocytes Relative 7 %   Monocytes Absolute 1.1 (H) 0.1 - 1.0 K/uL   Eosinophils Relative 1 %   Eosinophils Absolute 0.2 0.0 - 0.5 K/uL   Basophils Relative 0 %   Basophils Absolute 0.0 0.0 - 0.1 K/uL   Immature Granulocytes 1 %   Abs Immature Granulocytes 0.07 0.00 - 0.07 K/uL  Basic metabolic panel  Result Value Ref Range   Sodium 136 135 - 145 mmol/L   Potassium 3.7 3.5 - 5.1 mmol/L   Chloride 106 98 - 111 mmol/L   CO2 22 22 - 32 mmol/L   Glucose, Bld 98 70 - 99 mg/dL   BUN 29 (H) 8 - 23 mg/dL   Creatinine, Ser 0.87 0.44 - 1.00 mg/dL   Calcium 8.4 (L) 8.9 - 10.3 mg/dL   GFR, Estimated >60 >60 mL/min   Anion gap 8 5 - 15  CBC  Result Value Ref Range   WBC 11.0 (H) 4.0 - 10.5 K/uL   RBC 4.23 3.87 - 5.11 MIL/uL   Hemoglobin 12.3 12.0 - 15.0 g/dL   HCT 37.6 36.0 - 46.0 %   MCV 88.9 80.0 - 100.0 fL   MCH 29.1 26.0 - 34.0 pg   MCHC 32.7 30.0 - 36.0 g/dL   RDW 13.2 11.5 - 15.5 %   Platelets 236 150 - 400 K/uL   nRBC 0.0 0.0 - 0.2 %      Assessment & Plan:   Problem List Items Addressed  This Visit       Other   Cellulitis of left hand - Primary    Resolved. Still has 3-4 days left on her antibiotics. Finish antibiotics. Will check BMP and CBC. Call with any concerns.       Relevant Orders   CBC with Differential/Platelet   Basic metabolic panel     Follow up plan: Return if symptoms worsen or fail to improve.

## 2023-01-09 ENCOUNTER — Telehealth: Payer: Self-pay | Admitting: Urology

## 2023-01-09 DIAGNOSIS — H903 Sensorineural hearing loss, bilateral: Secondary | ICD-10-CM | POA: Diagnosis not present

## 2023-01-09 LAB — BASIC METABOLIC PANEL
BUN/Creatinine Ratio: 28 (ref 12–28)
BUN: 24 mg/dL (ref 8–27)
CO2: 22 mmol/L (ref 20–29)
Calcium: 9.6 mg/dL (ref 8.7–10.3)
Chloride: 104 mmol/L (ref 96–106)
Creatinine, Ser: 0.87 mg/dL (ref 0.57–1.00)
Glucose: 88 mg/dL (ref 70–99)
Potassium: 4.8 mmol/L (ref 3.5–5.2)
Sodium: 141 mmol/L (ref 134–144)
eGFR: 71 mL/min/{1.73_m2} (ref >59)

## 2023-01-09 LAB — CBC WITH DIFFERENTIAL/PLATELET
Basophils Absolute: 0 10*3/uL (ref 0.0–0.2)
Basos: 1 %
EOS (ABSOLUTE): 0.4 10*3/uL (ref 0.0–0.4)
Eos: 6 %
Hematocrit: 41.9 % (ref 34.0–46.6)
Hemoglobin: 13.8 g/dL (ref 11.1–15.9)
Immature Grans (Abs): 0.1 10*3/uL (ref 0.0–0.1)
Immature Granulocytes: 2 %
Lymphocytes Absolute: 1 10*3/uL (ref 0.7–3.1)
Lymphs: 17 %
MCH: 29.3 pg (ref 26.6–33.0)
MCHC: 32.9 g/dL (ref 31.5–35.7)
MCV: 89 fL (ref 79–97)
Monocytes Absolute: 0.5 10*3/uL (ref 0.1–0.9)
Monocytes: 8 %
Neutrophils Absolute: 4 10*3/uL (ref 1.4–7.0)
Neutrophils: 66 %
Platelets: 334 10*3/uL (ref 150–450)
RBC: 4.71 x10E6/uL (ref 3.77–5.28)
RDW: 12.3 % (ref 11.7–15.4)
WBC: 6 10*3/uL (ref 3.4–10.8)

## 2023-01-09 NOTE — Telephone Encounter (Signed)
DOS - 02/01/23  KELLER BUNION IMPLANT RIGHT --- [id number]  [facility]  RECEIVED A FAX FROM [facility] STATING THAT CPT CODE 60454 HAS BEEN APPROVED, AUTH # [id number], GOOD FROM 02/01/23 - 05/02/23.

## 2023-01-31 ENCOUNTER — Encounter: Payer: Self-pay | Admitting: Family Medicine

## 2023-01-31 MED ORDER — DICLOFENAC SODIUM 1 % EX GEL: 4.0000 g | Freq: Four times a day (QID) | CUTANEOUS | 3 refills | Status: DC

## 2023-02-01 ENCOUNTER — Other Ambulatory Visit: Payer: Self-pay | Admitting: Podiatry

## 2023-02-01 DIAGNOSIS — G8918 Other acute postprocedural pain: Secondary | ICD-10-CM | POA: Diagnosis not present

## 2023-02-01 DIAGNOSIS — M205X1 Other deformities of toe(s) (acquired), right foot: Secondary | ICD-10-CM | POA: Diagnosis not present

## 2023-02-01 DIAGNOSIS — M19071 Primary osteoarthritis, right ankle and foot: Secondary | ICD-10-CM | POA: Diagnosis not present

## 2023-02-01 MED ORDER — OXYCODONE-ACETAMINOPHEN 5-325 MG PO TABS: 1.0000 | ORAL_TABLET | ORAL | 0 refills | Status: DC | PRN

## 2023-02-01 NOTE — Progress Notes (Signed)
PRN postop 

## 2023-02-06 ENCOUNTER — Ambulatory Visit (INDEPENDENT_AMBULATORY_CARE_PROVIDER_SITE_OTHER): Payer: PPO

## 2023-02-06 ENCOUNTER — Ambulatory Visit (INDEPENDENT_AMBULATORY_CARE_PROVIDER_SITE_OTHER): Payer: PPO | Admitting: Podiatry

## 2023-02-06 ENCOUNTER — Encounter: Payer: Self-pay | Admitting: Podiatry

## 2023-02-06 VITALS — BP 137/85 | HR 73

## 2023-02-06 DIAGNOSIS — Z9889 Other specified postprocedural states: Secondary | ICD-10-CM

## 2023-02-06 NOTE — Progress Notes (Signed)
   No chief complaint on file.   Subjective:  Patient presents today status post right great toe arthroplasty with implant.  DOS: 02/01/2023.  Patient doing well.  WBAT in the cam boot as instructed.  No new complaints  Past Medical History:  Diagnosis Date   Anemia    Cataract    Dry eye syndrome    Positive colorectal cancer screening using Cologuard test 02/14/2017   Stomach ulcer     Past Surgical History:  Procedure Laterality Date   COLONOSCOPY WITH PROPOFOL N/A 04/27/2017   Procedure: COLONOSCOPY WITH PROPOFOL;  Surgeon: Wyline Mood, MD;  Location: [facility] ENDOSCOPY;  Service: Endoscopy;  Laterality: N/A;   COLONOSCOPY WITH PROPOFOL N/A 05/09/2021   Procedure: COLONOSCOPY WITH PROPOFOL;  Surgeon: Wyline Mood, MD;  Location: [facility] ENDOSCOPY;  Service: Gastroenterology;  Laterality: N/A;   EYE SURGERY     Cataract   FOOT SURGERY     Change the alignment of the toes to stop the formation of corns   gastric bypass  1981    No Known Allergies  Objective/Physical Exam Neurovascular status intact.  Incision well coapted with sutures intact. No sign of infectious process noted. No dehiscence. No active bleeding noted.  Minimal edema noted to the surgical extremity.  Radiographic Exam RT foot 02/06/2023:  Arthroplasty of the first MTP noted.  Intact implant.  Clean osteotomies.  Well aligned first ray.  Overall well-healing surgical foot radiographically  Assessment: 1. s/p right great toe arthroplasty with implant. DOS: 02/01/2023   Plan of Care:  1. Patient was evaluated. X-rays reviewed 2.  Dressings changed.  Patient may begin washing and showering and getting the foot wet 3.  Can be WBAT cam boot 4.  Ace wrap's applied and dispensed.  Wear daily  5.  Return to clinic 1 week suture removal   Felecia Shelling, DPM [facility]  Dr. Felecia Shelling, DPM    [address] [place], [place] [address]                Office [phone]  Fax [phone]

## 2023-02-13 ENCOUNTER — Ambulatory Visit (INDEPENDENT_AMBULATORY_CARE_PROVIDER_SITE_OTHER): Payer: PPO | Admitting: Podiatry

## 2023-02-13 ENCOUNTER — Encounter: Payer: Self-pay | Admitting: Podiatry

## 2023-02-13 VITALS — BP 135/86 | HR 69

## 2023-02-13 DIAGNOSIS — Z9889 Other specified postprocedural states: Secondary | ICD-10-CM

## 2023-02-13 NOTE — Progress Notes (Signed)
   Chief Complaint  Patient presents with   Routine Post Op    "It's doing okay."    Subjective:  Patient presents today status post right great toe arthroplasty with implant.  DOS: 02/01/2023.  Patient doing well.  She continues to be WBAT cam boot.  She has been washing her foot and applying the Ace wrap as instructed.  Past Medical History:  Diagnosis Date   Anemia    Cataract    Dry eye syndrome    Positive colorectal cancer screening using Cologuard test 02/14/2017   Stomach ulcer     Past Surgical History:  Procedure Laterality Date   COLONOSCOPY WITH PROPOFOL N/A 04/27/2017   Procedure: COLONOSCOPY WITH PROPOFOL;  Surgeon: Wyline Mood, MD;  Location: [facility] ENDOSCOPY;  Service: Endoscopy;  Laterality: N/A;   COLONOSCOPY WITH PROPOFOL N/A 05/09/2021   Procedure: COLONOSCOPY WITH PROPOFOL;  Surgeon: Wyline Mood, MD;  Location: [facility] ENDOSCOPY;  Service: Gastroenterology;  Laterality: N/A;   EYE SURGERY     Cataract   FOOT SURGERY     Change the alignment of the toes to stop the formation of corns   gastric bypass  1981    No Known Allergies  Objective/Physical Exam Neurovascular status intact.  Incision nicely healed with sutures intact.  No edema.  No dehiscence.  Overall well-healing surgical foot  Radiographic Exam RT foot 02/06/2023:  Arthroplasty of the first MTP noted.  Intact implant.  Clean osteotomies.  Well aligned first ray.  Overall well-healing surgical foot radiographically  Assessment: 1. s/p right great toe arthroplasty with implant. DOS: 02/01/2023  -Sutures removed -Patient may now transition out of the cam boot to good supportive tennis shoes and sneakers -Slowly increase activity over the next 3 weeks -Return to clinic 3 weeks for final follow-up x-ray and evaluation   Felecia Shelling, DPM [facility]  Dr. Felecia Shelling, DPM    [address] [place], [place] [address]                Office [phone]  Fax [phone]

## 2023-02-27 ENCOUNTER — Encounter: Payer: PPO | Admitting: Podiatry

## 2023-03-02 ENCOUNTER — Encounter: Payer: Self-pay | Admitting: Family Medicine

## 2023-03-06 ENCOUNTER — Ambulatory Visit (INDEPENDENT_AMBULATORY_CARE_PROVIDER_SITE_OTHER): Payer: PPO

## 2023-03-06 ENCOUNTER — Ambulatory Visit (INDEPENDENT_AMBULATORY_CARE_PROVIDER_SITE_OTHER): Payer: PPO | Admitting: Podiatry

## 2023-03-06 DIAGNOSIS — M205X1 Other deformities of toe(s) (acquired), right foot: Secondary | ICD-10-CM

## 2023-03-06 NOTE — Progress Notes (Signed)
   No chief complaint on file.   Subjective:  Patient presents today status post right great toe arthroplasty with implant.  DOS: 02/01/2023.  Patient doing well.  She is ambulating in tennis shoes.  No pain.  She does notice some slight swelling depending on her activity for the day and stiffness of the toe joint.  Past Medical History:  Diagnosis Date   Anemia    Cataract    Dry eye syndrome    Positive colorectal cancer screening using Cologuard test 02/14/2017   Stomach ulcer     Past Surgical History:  Procedure Laterality Date   COLONOSCOPY WITH PROPOFOL N/A 04/27/2017   Procedure: COLONOSCOPY WITH PROPOFOL;  Surgeon: Wyline Mood, MD;  Location: [facility] ENDOSCOPY;  Service: Endoscopy;  Laterality: N/A;   COLONOSCOPY WITH PROPOFOL N/A 05/09/2021   Procedure: COLONOSCOPY WITH PROPOFOL;  Surgeon: Wyline Mood, MD;  Location: [facility] ENDOSCOPY;  Service: Gastroenterology;  Laterality: N/A;   EYE SURGERY     Cataract   FOOT SURGERY     Change the alignment of the toes to stop the formation of corns   gastric bypass  1981    No Known Allergies  Objective/Physical Exam Neurovascular status intact.  Incision nicely healed.  There is some limited range of motion of first MTP implant but minimal tenderness with palpation.  No appreciable edema today.  Toes in good rectus alignment overall well-healing surgical foot  Radiographic Exam RT foot 03/06/2023:  Stable and mostly unchanged from prior x-rays.  Arthroplasty of the first MTP noted.  Intact implant.  Clean osteotomies.  Well aligned first ray.  Overall well-healing surgical foot radiographically  Assessment: 1. s/p right great toe arthroplasty with implant. DOS: 02/01/2023 -Sutures removed - Okay to resume full activity no restrictions.  Continue wearing good supportive tennis shoes and sneakers -Recommend daily range of motion exercises to the great toe joint -Return to clinic as needed     Felecia Shelling, DPM [facility]  Dr. Felecia Shelling, DPM    [address] [place], [place] [address]                Office [phone]  Fax [phone]

## 2023-03-19 ENCOUNTER — Ambulatory Visit (INDEPENDENT_AMBULATORY_CARE_PROVIDER_SITE_OTHER): Payer: PPO | Admitting: Family Medicine

## 2023-03-19 ENCOUNTER — Encounter: Payer: Self-pay | Admitting: Family Medicine

## 2023-03-19 VITALS — BP 111/74 | HR 60 | Temp 98.2°F | Ht 69.0 in | Wt 215.4 lb

## 2023-03-19 DIAGNOSIS — F5081 Binge eating disorder: Secondary | ICD-10-CM | POA: Diagnosis not present

## 2023-03-19 DIAGNOSIS — Z6831 Body mass index (BMI) 31.0-31.9, adult: Secondary | ICD-10-CM

## 2023-03-19 DIAGNOSIS — I700 Atherosclerosis of aorta: Secondary | ICD-10-CM

## 2023-03-19 DIAGNOSIS — E6609 Other obesity due to excess calories: Secondary | ICD-10-CM

## 2023-03-19 DIAGNOSIS — R0609 Other forms of dyspnea: Secondary | ICD-10-CM

## 2023-03-19 DIAGNOSIS — F339 Major depressive disorder, recurrent, unspecified: Secondary | ICD-10-CM

## 2023-03-19 DIAGNOSIS — R5382 Chronic fatigue, unspecified: Secondary | ICD-10-CM | POA: Diagnosis not present

## 2023-03-19 DIAGNOSIS — F32A Depression, unspecified: Secondary | ICD-10-CM | POA: Diagnosis not present

## 2023-03-19 DIAGNOSIS — Z6833 Body mass index (BMI) 33.0-33.9, adult: Secondary | ICD-10-CM

## 2023-03-19 MED ORDER — LISDEXAMFETAMINE DIMESYLATE 60 MG PO CAPS: 60.0000 mg | ORAL_CAPSULE | ORAL | 0 refills | Status: DC

## 2023-03-19 NOTE — Assessment & Plan Note (Signed)
Weight stable. Has been doing well on her medicine. Interested in starting wegovy.

## 2023-03-19 NOTE — Assessment & Plan Note (Signed)
Under good control on current regimen. Continue current regimen. Continue to monitor. Call with any concerns. Refills given for 3 months. Follow up 3 months.    

## 2023-03-19 NOTE — Progress Notes (Signed)
BP 111/74   Pulse 60   Temp 98.2 F (36.8 C) (Oral)   Ht 5\' 9"  (1.753 m)   Wt 215 lb 6.4 oz (97.7 kg)   SpO2 98%   BMI 31.81 kg/m    Subjective:    Patient ID: Mckenzie Webster, female    DOB: 1951-07-17, 72 y.o.   MRN: [id number]  HPI: Mckenzie Webster is a 72 y.o. female  Chief Complaint  Patient presents with   Obesity   Binge Eating Disorder   OBESITY Duration: chronic Previous attempts at weight loss: yes- gastric bypass and diet Complications of obesity: HTN, GERD, Depression Peak weight: 260 Weight loss goal: to be healthy  Weight loss to date: 15lbs previously Requesting obesity pharmacotherapy: yes Current weight loss supplements/medications: no Previous weight loss supplements/meds: no  Feels like the vyvanse is continuing to help with her binging. Weight has been stable.   FATIGUE- notes that she has been SOB with exertion and has been feeling tired with mild exertion. Concerned about her heart.  Duration:  months Severity: mild  Onset: gradual Context when symptoms started:  unknown Symptoms improve with rest: yes  Depressive symptoms: no Stress/anxiety: no Insomnia: no  Snoring: no Observed apnea by bed partner: no Daytime hypersomnolence:no Wakes feeling refreshed: yes History of sleep study: no Dysnea on exertion:  yes Orthopnea/PND: no Chest pain: no Chronic cough: no Lower extremity edema: no Arthralgias:no Myalgias: no Weakness: no Rash: no     Relevant past medical, surgical, family and social history reviewed and updated as indicated. Interim medical history since our last visit reviewed. Allergies and medications reviewed and updated.  Review of Systems  Constitutional:  Positive for fatigue. Negative for activity change, appetite change, chills, diaphoresis, fever and unexpected weight change.  Respiratory:  Positive for shortness of breath (with exertion). Negative for apnea, cough, choking, chest tightness, wheezing and stridor.    Cardiovascular: Negative.   Gastrointestinal: Negative.   Musculoskeletal: Negative.   Psychiatric/Behavioral: Negative.      Per HPI unless specifically indicated above     Objective:    BP 111/74   Pulse 60   Temp 98.2 F (36.8 C) (Oral)   Ht 5\' 9"  (1.753 m)   Wt 215 lb 6.4 oz (97.7 kg)   SpO2 98%   BMI 31.81 kg/m   Wt Readings from Last 3 Encounters:  03/19/23 215 lb 6.4 oz (97.7 kg)  01/08/23 217 lb 12.8 oz (98.8 kg)  01/02/23 216 lb (98 kg)    Physical Exam Vitals and nursing note reviewed.  Constitutional:      General: She is not in acute distress.    Appearance: Normal appearance. She is obese. She is not ill-appearing, toxic-appearing or diaphoretic.  HENT:     Head: Normocephalic and atraumatic.     Right Ear: External ear normal.     Left Ear: External ear normal.     Nose: Nose normal.     Mouth/Throat:     Mouth: Mucous membranes are moist.     Pharynx: Oropharynx is clear.  Eyes:     General: No scleral icterus.       Right eye: No discharge.        Left eye: No discharge.     Extraocular Movements: Extraocular movements intact.     Conjunctiva/sclera: Conjunctivae normal.     Pupils: Pupils are equal, round, and reactive to light.  Cardiovascular:     Rate and Rhythm: Normal rate and regular  rhythm.     Pulses: Normal pulses.     Heart sounds: Normal heart sounds. No murmur heard.    No friction rub. No gallop.  Pulmonary:     Effort: Pulmonary effort is normal. No respiratory distress.     Breath sounds: Normal breath sounds. No stridor. No wheezing, rhonchi or rales.  Chest:     Chest wall: No tenderness.  Musculoskeletal:        General: Normal range of motion.     Cervical back: Normal range of motion and neck supple.  Skin:    General: Skin is warm and dry.     Capillary Refill: Capillary refill takes less than 2 seconds.     Coloration: Skin is not jaundiced or pale.     Findings: No bruising, erythema, lesion or rash.   Neurological:     General: No focal deficit present.     Mental Status: She is alert and oriented to person, place, and time. Mental status is at baseline.  Psychiatric:        Mood and Affect: Mood normal.        Behavior: Behavior normal.        Thought Content: Thought content normal.        Judgment: Judgment normal.     Results for orders placed or performed in visit on 01/08/23  CBC with Differential/Platelet  Result Value Ref Range   WBC 6.0 3.4 - 10.8 x10E3/uL   RBC 4.71 3.77 - 5.28 x10E6/uL   Hemoglobin 13.8 11.1 - 15.9 g/dL   Hematocrit 16.1 09.6 - 46.6 %   MCV 89 79 - 97 fL   MCH 29.3 26.6 - 33.0 pg   MCHC 32.9 31.5 - 35.7 g/dL   RDW 04.5 40.9 - 81.1 %   Platelets 334 150 - 450 x10E3/uL   Neutrophils 66 Not Estab. %   Lymphs 17 Not Estab. %   Monocytes 8 Not Estab. %   Eos 6 Not Estab. %   Basos 1 Not Estab. %   Neutrophils Absolute 4.0 1.4 - 7.0 x10E3/uL   Lymphocytes Absolute 1.0 0.7 - 3.1 x10E3/uL   Monocytes Absolute 0.5 0.1 - 0.9 x10E3/uL   EOS (ABSOLUTE) 0.4 0.0 - 0.4 x10E3/uL   Basophils Absolute 0.0 0.0 - 0.2 x10E3/uL   Immature Granulocytes 2 Not Estab. %   Immature Grans (Abs) 0.1 0.0 - 0.1 x10E3/uL  Basic metabolic panel  Result Value Ref Range   Glucose 88 70 - 99 mg/dL   BUN 24 8 - 27 mg/dL   Creatinine, Ser 9.14 0.57 - 1.00 mg/dL   eGFR 71 >78 GN/FAO/1.30   BUN/Creatinine Ratio 28 12 - 28   Sodium 141 134 - 144 mmol/L   Potassium 4.8 3.5 - 5.2 mmol/L   Chloride 104 96 - 106 mmol/L   CO2 22 20 - 29 mmol/L   Calcium 9.6 8.7 - 10.3 mg/dL      Assessment & Plan:   Problem List Items Addressed This Visit       Cardiovascular and Mediastinum   Aortic atherosclerosis (HCC)    Will keep BP and cholesterol under good control. Continue to monitor. Call with any concerns. Concern about DOE. Will get her into cardiology for further evaluation.       Relevant Orders   Ambulatory referral to Cardiology     Other   Binge eating disorder -  Primary    Under good control on current regimen. Continue current regimen. Continue  to monitor. Call with any concerns. Refills given for 3 months. Follow up 3 months.        Depression, recurrent (HCC)    Doing well not on medicine. Continue to monitor.       Class 1 obesity due to excess calories with serious comorbidity and body mass index (BMI) of 33.0 to 33.9 in adult    Weight stable. Has been doing well on her medicine. Interested in starting wegovy.       Relevant Medications   lisdexamfetamine (VYVANSE) 60 MG capsule (Start on 04/18/2023)   lisdexamfetamine (VYVANSE) 60 MG capsule (Start on 05/18/2023)   lisdexamfetamine (VYVANSE) 60 MG capsule   Other Visit Diagnoses     Chronic fatigue       Concern about DOE coupled with fatigue. Will get her into cardiology for further evaluation. Await their input.   Relevant Orders   Ambulatory referral to Cardiology   DOE (dyspnea on exertion)       Relevant Orders   Ambulatory referral to Cardiology        Follow up plan: Return in about 3 months (around 06/19/2023).

## 2023-03-19 NOTE — Assessment & Plan Note (Signed)
Will keep BP and cholesterol under good control. Continue to monitor. Call with any concerns. Concern about DOE. Will get her into cardiology for further evaluation.

## 2023-03-19 NOTE — Assessment & Plan Note (Signed)
Doing well not on medicine. Continue to monitor.

## 2023-03-26 ENCOUNTER — Encounter: Payer: Self-pay | Admitting: Podiatry

## 2023-03-30 ENCOUNTER — Other Ambulatory Visit: Payer: Self-pay | Admitting: Podiatry

## 2023-03-30 DIAGNOSIS — M205X1 Other deformities of toe(s) (acquired), right foot: Secondary | ICD-10-CM

## 2023-04-19 ENCOUNTER — Encounter: Payer: Self-pay | Admitting: Cardiology

## 2023-04-19 ENCOUNTER — Ambulatory Visit: Payer: PPO | Attending: Cardiology | Admitting: Cardiology

## 2023-04-19 ENCOUNTER — Other Ambulatory Visit: Payer: Self-pay

## 2023-04-19 VITALS — BP 130/90 | HR 64 | Ht 69.0 in | Wt 222.0 lb

## 2023-04-19 DIAGNOSIS — R072 Precordial pain: Secondary | ICD-10-CM | POA: Insufficient documentation

## 2023-04-19 DIAGNOSIS — I700 Atherosclerosis of aorta: Secondary | ICD-10-CM | POA: Diagnosis not present

## 2023-04-19 DIAGNOSIS — D509 Iron deficiency anemia, unspecified: Secondary | ICD-10-CM | POA: Diagnosis not present

## 2023-04-19 DIAGNOSIS — E6609 Other obesity due to excess calories: Secondary | ICD-10-CM

## 2023-04-19 DIAGNOSIS — Z6833 Body mass index (BMI) 33.0-33.9, adult: Secondary | ICD-10-CM

## 2023-04-19 DIAGNOSIS — R0609 Other forms of dyspnea: Secondary | ICD-10-CM

## 2023-04-19 DIAGNOSIS — I129 Hypertensive chronic kidney disease with stage 1 through stage 4 chronic kidney disease, or unspecified chronic kidney disease: Secondary | ICD-10-CM | POA: Diagnosis not present

## 2023-04-19 MED ORDER — METOPROLOL TARTRATE 50 MG PO TABS: ORAL_TABLET | ORAL | 0 refills | Status: DC

## 2023-04-19 NOTE — Progress Notes (Signed)
Primary Care Provider: Dorcas Carrow, DO [facility] Cardiologist: None Electrophysiologist: None  Clinic Note: Chief Complaint  Patient presents with   NEW Pateint     Ref by Mckenzie Webster for Aortic atherosclerosis. Patient c/o chronic fatigue and shortness of breath with any amount of exertion. Medications reviewed by the patient verbally.    ===================================  ASSESSMENT/PLAN   Problem List Items Addressed This Visit       Cardiology Problems   Aortic atherosclerosis (HCC) (Chronic)    Currently on ACE inhibitor for blood pressure control.  Not on any type of statin as her lipids are pretty well-controlled with an LDL of 74.  Presence of atherosclerotic disease warrants further evaluation.      Relevant Medications   metoprolol tartrate (LOPRESSOR) 50 MG tablet     Other   Precordial pain (Chronic)    Some chest tightness associated with exertional dyspnea.  But mostly profound exertional dyspnea.  She does have atherosclerotic disease in the aorta but lipids are pretty well-controlled as is BNP.  Risk factors are pretty stable for someone who has significant symptoms.  Need to exclude occlusive CAD.  With her body habitus some little bit concerned of the success of nuclear stress testing to be accurate.  Would want to have a more definitive evaluation using Coronary CTA with possible FFR ct.  Based on those results, can proceed with either invasive evaluation versus CPX stress testing.  Plan: Check Coronary CTA         Relevant Orders   EKG 12-Lead   ECHOCARDIOGRAM COMPLETE   CT CORONARY MORPH W/CTA COR W/SCORE W/CA W/CM &/OR WO/CM   Iron deficiency anemia   DOE (dyspnea on exertion) (Chronic)    Pretty debilitating exertional dyspnea that seems to be relatively new of duration.  Compared to a couple years ago she is extremely limited.  Of course there is concern that this could be an anginal equivalent, especially since she has had  some episodes of chest tightness associated with it.  Reasonable to exclude ischemic CAD.  Based on the level of exercise intolerance, I do not see that she would be able to have a decent amount of time on a treadmill in order to assess this.  I would like to exclude ischemic CAD first, if there is no evidence of obstructive CAD, my recommendation would be to consider CPX (cardiopulmonary stress test).  Plan: Check 2D echo and Coronary CTA      Relevant Orders   ECHOCARDIOGRAM COMPLETE   Class 1 obesity due to excess calories with serious comorbidity and body mass index (BMI) of 33.0 to 33.9 in adult   Benign hypertensive renal disease - Primary (Chronic)    Well-controlled BP on ACE inhibitor.       ===================================  HPI:    Mckenzie Webster is an obese 72 y.o. female former smoker with no real medical history of hypertension or hyperlipidemia documented although has imaging studies suggesting aortic atherosclerosis who is being seen today for the evaluation of SHORTNESS OF BREATH ON EXERTION at the request of Mckenzie Webster P, DO.  Mckenzie Webster was last seen on Mar 19, 2023 by Mckenzie Webster.  She had chronic fatigue and exertional dyspnea  Recent Hospitalizations: none  Reviewed  CV studies:    The following studies were reviewed today: (if available, images/films reviewed: From Epic Chart or Care Everywhere) none:  Interval History:   Mckenzie Webster presents for evaluation fo Fatige -  decreased stamina & concern for Fam Hx of CAD. Enjoys working in [employer]ard - but having to take more frequent breaks 2/2 DOE.  Feels like an Old woman walking around.   Can walk to the park (but not keeping up with daughter) - goes slower & tries to hide her dyspnea from her daughter.   May note having some tightness in chest when gets SOB.  Has been attributing this to being overweight & out of shape - but why now when she has always been overweight. - Why the change over last ~ year or  so?   Also notes being less steady on her feet- near falls with at least one fall - landed on her chest. Feeet are numb - ? No prior neuropathy Sx.   Daughter notes coughing.  No wheezing.   CV Review of Symptoms (Summary):  positive for - chest pain, dyspnea on exertion, and exercise intolerance, cough spells negative for - edema, irregular heartbeat, orthopnea, palpitations, paroxysmal nocturnal dyspnea, rapid heart rate, shortness of breath, or syncope/near syncope, TIA/CVA/ amaruosis fugax, claudication.   REVIEWED OF SYSTEMS   Review of Systems  Constitutional:  Positive for malaise/fatigue (Significant exercise intolerance as noted in HPI.). Negative for weight loss.  Respiratory:  Positive for cough (She had a significant bout of coughing during our discussion.) and shortness of breath (With exertion).   Cardiovascular:  Negative for leg swelling.  Gastrointestinal:  Positive for abdominal pain. Negative for blood in stool and melena.       Last month - after eating out - had abdominal ain - diaphoresis, not improved with having BM= was totally "wiped out" unable to communicate.  Abdominal cramping & felt need to have BM.   Genitourinary:  Negative for hematuria.  Musculoskeletal:  Negative for back pain and joint pain.  Neurological:  Negative for dizziness and focal weakness.  Psychiatric/Behavioral:  Negative for depression and memory loss. The patient does not have insomnia.    I have reviewed and (if needed) personally updated the patient's problem list, medications, allergies, past medical and surgical history, social and family history.   PAST MEDICAL HISTORY   Past Medical History:  Diagnosis Date   Anemia    Cataract    Dry eye syndrome    Positive colorectal cancer screening using Cologuard test 02/14/2017   Stomach ulcer   GA - rash = Plaquenil Binge Eating  - Vyvanse   PAST SURGICAL HISTORY   Past Surgical History:  Procedure Laterality Date   COLONOSCOPY  WITH PROPOFOL N/A 04/27/2017   Procedure: COLONOSCOPY WITH PROPOFOL;  Surgeon: Mckenzie Mood, MD;  Location: [facility] ENDOSCOPY;  Service: Endoscopy;  Laterality: N/A;   COLONOSCOPY WITH PROPOFOL N/A 05/09/2021   Procedure: COLONOSCOPY WITH PROPOFOL;  Surgeon: Mckenzie Mood, MD;  Location: [facility] ENDOSCOPY;  Service: Gastroenterology;  Laterality: N/A;   EYE SURGERY     Cataract   FOOT SURGERY     Change the alignment of the toes to stop the formation of corns   gastric bypass  1981    MEDICATIONS/ALLERGIES   Current Meds  Medication Sig   albuterol (VENTOLIN HFA) 108 (90 Base) MCG/ACT inhaler Inhale 2 puffs into the lungs every 6 (six) hours as needed for wheezing or shortness of breath.   cetirizine (ZYRTEC) 10 MG tablet Take 10 mg by mouth daily.   diclofenac Sodium (VOLTAREN) 1 % GEL Apply 4 g topically 4 (four) times daily.   famotidine (PEPCID) 20 MG tablet Take 1 tablet (20  mg total) by mouth 2 (two) times daily.   ferrous sulfate 325 (65 FE) MG EC tablet Take 1 tablet (325 mg total) by mouth daily.   fluticasone (FLONASE) 50 MCG/ACT nasal spray Place 2 sprays into both nostrils daily.    hydroxychloroquine (PLAQUENIL) 200 MG tablet Take 200 mg by mouth 2 (two) times daily.   lisdexamfetamine (VYVANSE) 60 MG capsule Take 1 capsule (60 mg total) by mouth every morning.   lisinopril (ZESTRIL) 10 MG tablet Take 1 tablet (10 mg total) by mouth daily.   metoprolol tartrate (LOPRESSOR) 50 MG tablet Take 1 tablet (50 mg total) by mouth once 2 hours prior to CT.   naproxen (NAPROSYN) 500 MG tablet Take 1 tablet (500 mg total) by mouth 2 (two) times daily with a meal. For next few days and then as needed    No Known Allergies  SOCIAL HISTORY/FAMILY HISTORY   Reviewed in Epic:   Social History   Tobacco Use   Smoking status: Former    Types: Cigarettes    Quit date: 10/30/1989    Years since quitting: 33.4   Smokeless tobacco: Never  Vaping Use   Vaping Use: Never used  Substance Use  Topics   Alcohol use: Not Currently    Comment: on occasion   Drug use: No   Social History   Social History Narrative   Not on file   Family History  Problem Relation Age of Onset   Heart disease Mother    COPD Mother    Heart disease Father    Hypertension Father    Heart disease Maternal Grandfather    Alzheimer's disease Paternal Grandmother    Breast cancer Neg Hx    Mother -COPD & " Heart Disease" = heart failure, MI pre-hospice (x 2 yrs) in her 39s Father - thought he died from MI in 67s.  OBJCTIVE -PE, EKG, labs   Wt Readings from Last 3 Encounters:  04/19/23 222 lb (100.7 kg)  03/19/23 215 lb 6.4 oz (97.7 kg)  01/08/23 217 lb 12.8 oz (98.8 kg)    Physical Exam: BP (!) 130/90 (BP Location: Right Arm, Patient Position: Sitting, Cuff Size: Normal)   Pulse 64   Ht 5\' 9"  (1.753 m)   Wt 222 lb (100.7 kg)   SpO2 97%   BMI 32.78 kg/m  Physical Exam Vitals reviewed.  Constitutional:      General: She is not in acute distress.    Appearance: Normal appearance. She is obese. She is not ill-appearing or toxic-appearing.  HENT:     Head: Normocephalic and atraumatic.  Neck:     Vascular: No carotid bruit.  Cardiovascular:     Rate and Rhythm: Normal rate and regular rhythm.     Pulses: Normal pulses.     Heart sounds: Normal heart sounds. No murmur heard.    No friction rub. No gallop.  Pulmonary:     Effort: Pulmonary effort is normal. No respiratory distress.     Breath sounds: Normal breath sounds. No wheezing, rhonchi or rales.  Chest:     Chest wall: No tenderness.  Musculoskeletal:        General: No swelling. Normal range of motion.     Cervical back: Normal range of motion and neck supple.  Skin:    Coloration: Skin is not jaundiced or pale.  Neurological:     General: No focal deficit present.     Mental Status: She is alert and oriented to person, place,  and time.     Gait: Gait normal.  Psychiatric:        Webster and Affect: Webster normal.         Behavior: Behavior normal.        Thought Content: Thought content normal.        Judgment: Judgment normal.     Adult ECG Report  Rate: 64 ;  Rhythm: normal sinus rhythm and low voltage.  Otw normal axis, intervals & durations.  ;   Narrative Interpretation: essentially normal.   Recent Labs:  reviewed  Lab Results  Component Value Date   CHOL 162 12/12/2022   HDL 76 12/12/2022   LDLCALC 74 12/12/2022   TRIG 62 12/12/2022   Lab Results  Component Value Date   CREATININE 0.87 01/08/2023   BUN 24 01/08/2023   NA 141 01/08/2023   K 4.8 01/08/2023   CL 104 01/08/2023   CO2 22 01/08/2023      Latest Ref Rng & Units 01/08/2023   10:56 AM 01/04/2023    2:39 AM 01/03/2023    8:31 AM  CBC  WBC 3.4 - 10.8 x10E3/uL 6.0  11.0  14.9   Hemoglobin 11.1 - 15.9 g/dL 16.1  09.6  04.5   Hematocrit 34.0 - 46.6 % 41.9  37.6  42.2   Platelets 150 - 450 x10E3/uL 334  236  253     Lab Results  Component Value Date   HGBA1C 5.5 11/24/2016   Lab Results  Component Value Date   TSH 4.070 06/20/2022    ================================================== I spent a total of 29 minutes with the patient spent in direct patient consultation.  Additional time spent with chart review  / charting (studies, outside notes, etc): 26 min Total Time: 55 min  Current medicines are reviewed at length with the patient today.  (+/- concerns) N/A  Notice: This dictation was prepared with Dragon dictation along with smart phrase technology. Any transcriptional errors that result from this process are unintentional and may not be corrected upon review.   Studies Ordered:  Orders Placed This Encounter  Procedures   CT CORONARY MORPH W/CTA COR W/SCORE W/CA W/CM &/OR WO/CM   EKG 12-Lead   ECHOCARDIOGRAM COMPLETE   Meds ordered this encounter  Medications   metoprolol tartrate (LOPRESSOR) 50 MG tablet    Sig: Take 1 tablet (50 mg total) by mouth once 2 hours prior to CT.    Dispense:  1 tablet     Refill:  0    Patient Instructions / Medication Changes & Studies & Tests Ordered   Patient Instructions  Medication Instructions:  Your physician recommends that you continue on your current medications as directed. Please refer to the Current Medication list given to you today.  *If you need a refill on your cardiac medications before your next appointment, please call your pharmacy*   Lab Work: -None ordered If you have labs (blood work) drawn today and your tests are completely normal, you will receive your results only by: MyChart Message (if you have MyChart) OR A paper copy in the mail If you have any lab test that is abnormal or we need to change your treatment, we will call you to review the results.   Testing/Procedures: Your physician has requested that you have an echocardiogram. Echocardiography is a painless test that uses sound waves to create images of your heart. It provides your doctor with information about the size and shape of your heart and how well your  heart's chambers and valves are working. This procedure takes approximately one hour. There are no restrictions for this procedure. Please do NOT wear cologne, perfume, aftershave, or lotions (deodorant is allowed). Please arrive 15 minutes prior to your appointment time.     Your cardiac CT will be scheduled at one of the below locations:   Follow-Up: At [facility], you and your health needs are our priority.  As part of our continuing mission to provide you with exceptional heart care, we have created designated Provider Care Teams.  These Care Teams include your primary Cardiologist (physician) and Advanced Practice Providers (APPs -  Physician Assistants and Nurse Practitioners) who all work together to provide you with the care you need, when you need it.  Your next appointment:   After testing is completed   Provider:   Bryan Lemma, MD    Other Instructions -None      Marykay Lex, MD, MS Bryan Lemma, M.D., M.S. Interventional Cardiologist  [facility]   [address]; [address] [place], [place]  [address] [phone]           Fax [phone]    Thank you for choosing [facility] in [place]!!

## 2023-04-19 NOTE — Assessment & Plan Note (Signed)
Some chest tightness associated with exertional dyspnea.  But mostly profound exertional dyspnea.  She does have atherosclerotic disease in the aorta but lipids are pretty well-controlled as is BNP.  Risk factors are pretty stable for someone who has significant symptoms.  Need to exclude occlusive CAD.  With her body habitus some little bit concerned of the success of nuclear stress testing to be accurate.  Would want to have a more definitive evaluation using Coronary CTA with possible FFR ct.  Based on those results, can proceed with either invasive evaluation versus CPX stress testing.  Plan: Check Coronary CTA

## 2023-04-19 NOTE — Assessment & Plan Note (Signed)
Pretty debilitating exertional dyspnea that seems to be relatively new of duration.  Compared to a couple years ago she is extremely limited.  Of course there is concern that this could be an anginal equivalent, especially since she has had some episodes of chest tightness associated with it.  Reasonable to exclude ischemic CAD.  Based on the level of exercise intolerance, I do not see that she would be able to have a decent amount of time on a treadmill in order to assess this.  I would like to exclude ischemic CAD first, if there is no evidence of obstructive CAD, my recommendation would be to consider CPX (cardiopulmonary stress test).  Plan: Check 2D echo and Coronary CTA

## 2023-04-19 NOTE — Patient Instructions (Signed)
Medication Instructions:  Your physician recommends that you continue on your current medications as directed. Please refer to the Current Medication list given to you today.  *If you need a refill on your cardiac medications before your next appointment, please call your pharmacy*   Lab Work: -None ordered If you have labs (blood work) drawn today and your tests are completely normal, you will receive your results only by: MyChart Message (if you have MyChart) OR A paper copy in the mail If you have any lab test that is abnormal or we need to change your treatment, we will call you to review the results.   Testing/Procedures: Your physician has requested that you have an echocardiogram. Echocardiography is a painless test that uses sound waves to create images of your heart. It provides your doctor with information about the size and shape of your heart and how well your heart's chambers and valves are working. This procedure takes approximately one hour. There are no restrictions for this procedure. Please do NOT wear cologne, perfume, aftershave, or lotions (deodorant is allowed). Please arrive 15 minutes prior to your appointment time.     Your cardiac CT will be scheduled at one of the below locations:   [facility] [address] [place], [place] [address] [phone]  OR   [facility] [address] [place], [place] [address] [phone]  If scheduled at [facility] or [facility], please arrive 15 mins early for check-in and test prep.  Please follow these instructions carefully (unless otherwise directed):  On the Night Before the Test: Be sure to Drink plenty of water. Do not consume any caffeinated/decaffeinated beverages or chocolate 12 hours prior to your test. Do not take any antihistamines 12 hours prior to your test.  On the Day of the  Test: Drink plenty of water until 1 hour prior to the test. Do not eat any food 1 hour prior to test. You may take your regular medications prior to the test.  Take metoprolol (Lopressor) two hours prior to test. If you take Furosemide/Hydrochlorothiazide/Spironolactone, please HOLD on the morning of the test. FEMALES- please wear underwire-free bra if available, avoid dresses & tight clothing     After the Test: Drink plenty of water. After receiving IV contrast, you may experience a mild flushed feeling. This is normal. On occasion, you may experience a mild rash up to 24 hours after the test. This is not dangerous. If this occurs, you can take Benadryl 25 mg and increase your fluid intake. If you experience trouble breathing, this can be serious. If it is severe call 911 IMMEDIATELY. If it is mild, please call our office. If you take any of these medications: Glipizide/Metformin, Avandament, Glucavance, please do not take 48 hours after completing test unless otherwise instructed.  We will call to schedule your test 2-4 weeks out understanding that some insurance companies will need an authorization prior to the service being performed.   For non-scheduling related questions, please contact the cardiac imaging nurse navigator should you have any questions/concerns: Rockwell Alexandria, Cardiac Imaging Nurse Navigator Larey Brick, Cardiac Imaging Nurse Navigator [facility] Direct Office Dial: [phone]   For scheduling needs, including cancellations and rescheduling, please call [place], [phone].    Follow-Up: At [facility] you and your health needs are our priority.  As part of our continuing mission to provide you with exceptional heart care, we  have created designated Provider Care Teams.  These Care Teams include your primary Cardiologist (physician) and Advanced Practice Providers (APPs -  Physician Assistants and Nurse Practitioners) who  all work together to provide you with the care you need, when you need it.  Your next appointment:   After testing is completed   Provider:   Bryan Lemma, MD    Other Instructions -None

## 2023-04-19 NOTE — Assessment & Plan Note (Signed)
Well-controlled BP on ACE inhibitor.

## 2023-04-19 NOTE — Assessment & Plan Note (Signed)
Currently on ACE inhibitor for blood pressure control.  Not on any type of statin as her lipids are pretty well-controlled with an LDL of 74.  Presence of atherosclerotic disease warrants further evaluation.

## 2023-04-24 ENCOUNTER — Other Ambulatory Visit
Admission: RE | Admit: 2023-04-24 | Discharge: 2023-04-24 | Disposition: A | Discharge status: Home / Self Care | Payer: PPO | Attending: Cardiology | Admitting: Cardiology

## 2023-04-24 DIAGNOSIS — R072 Precordial pain: Secondary | ICD-10-CM | POA: Insufficient documentation

## 2023-04-24 DIAGNOSIS — I700 Atherosclerosis of aorta: Secondary | ICD-10-CM | POA: Insufficient documentation

## 2023-04-24 LAB — BASIC METABOLIC PANEL
Anion gap: 10 (ref 5–15)
BUN: 34 mg/dL — ABNORMAL HIGH (ref 8–23)
CO2: 21 mmol/L — ABNORMAL LOW (ref 22–32)
Calcium: 8.9 mg/dL (ref 8.9–10.3)
Chloride: 105 mmol/L (ref 98–111)
Creatinine, Ser: 1.09 mg/dL — ABNORMAL HIGH (ref 0.44–1.00)
GFR, Estimated: 54 mL/min — ABNORMAL LOW (ref >60)
Glucose, Bld: 98 mg/dL (ref 70–99)
Potassium: 4.3 mmol/L (ref 3.5–5.1)
Sodium: 136 mmol/L (ref 135–145)

## 2023-04-25 ENCOUNTER — Ambulatory Visit: Payer: PPO | Attending: Cardiology

## 2023-04-25 DIAGNOSIS — R0609 Other forms of dyspnea: Secondary | ICD-10-CM | POA: Diagnosis not present

## 2023-04-25 DIAGNOSIS — R072 Precordial pain: Secondary | ICD-10-CM

## 2023-04-25 LAB — ECHOCARDIOGRAM COMPLETE
Area-P 1/2: 3.17 cm2
S' Lateral: 2.8 cm

## 2023-04-26 ENCOUNTER — Telehealth (HOSPITAL_COMMUNITY): Payer: Self-pay | Admitting: *Deleted

## 2023-04-26 NOTE — Telephone Encounter (Signed)
Attempted to call patient regarding upcoming cardiac CT appointment. Left message on voicemail with name and callback number Valon Glasscock RN Navigator Cardiac Imaging [facility] [phone] Office  

## 2023-04-27 ENCOUNTER — Encounter (HOSPITAL_COMMUNITY): Payer: Self-pay

## 2023-04-27 ENCOUNTER — Ambulatory Visit (HOSPITAL_COMMUNITY): Payer: PPO

## 2023-05-04 ENCOUNTER — Encounter: Payer: Self-pay | Admitting: Cardiology

## 2023-05-08 ENCOUNTER — Ambulatory Visit: Payer: PPO | Admitting: Medical

## 2023-05-08 NOTE — Telephone Encounter (Signed)
That is fine to cancel.  If the symptoms get worse, this is always an option to assess.  I felt like he would be better test than a stress test, and I do not think he could walk on a treadmill.  If you are feeling okay and stable, then we can hold off on ordering.  Otherwise if symptoms are worse, we can reschedule.  Bryan Lemma, MD

## 2023-06-19 ENCOUNTER — Encounter: Payer: Self-pay | Admitting: Family Medicine

## 2023-06-19 ENCOUNTER — Encounter: Payer: Self-pay | Admitting: Cardiology

## 2023-06-19 ENCOUNTER — Ambulatory Visit (INDEPENDENT_AMBULATORY_CARE_PROVIDER_SITE_OTHER): Payer: PPO | Admitting: Family Medicine

## 2023-06-19 VITALS — BP 138/85 | HR 64 | Temp 97.7°F | Wt 218.0 lb

## 2023-06-19 DIAGNOSIS — Z6833 Body mass index (BMI) 33.0-33.9, adult: Secondary | ICD-10-CM

## 2023-06-19 DIAGNOSIS — I129 Hypertensive chronic kidney disease with stage 1 through stage 4 chronic kidney disease, or unspecified chronic kidney disease: Secondary | ICD-10-CM

## 2023-06-19 DIAGNOSIS — I700 Atherosclerosis of aorta: Secondary | ICD-10-CM

## 2023-06-19 DIAGNOSIS — D692 Other nonthrombocytopenic purpura: Secondary | ICD-10-CM | POA: Diagnosis not present

## 2023-06-19 DIAGNOSIS — F339 Major depressive disorder, recurrent, unspecified: Secondary | ICD-10-CM

## 2023-06-19 DIAGNOSIS — E6609 Other obesity due to excess calories: Secondary | ICD-10-CM | POA: Diagnosis not present

## 2023-06-19 DIAGNOSIS — Z0000 Encounter for general adult medical examination without abnormal findings: Secondary | ICD-10-CM | POA: Diagnosis not present

## 2023-06-19 DIAGNOSIS — F5081 Binge eating disorder: Secondary | ICD-10-CM

## 2023-06-19 DIAGNOSIS — D509 Iron deficiency anemia, unspecified: Secondary | ICD-10-CM | POA: Diagnosis not present

## 2023-06-19 LAB — MICROALBUMIN, URINE WAIVED
Creatinine, Urine Waived: 200 mg/dL (ref 10–300)
Microalb, Ur Waived: 80 mg/L — ABNORMAL HIGH (ref 0–19)
Microalb/Creat Ratio: <30 mg/g (ref <30)

## 2023-06-19 NOTE — Progress Notes (Signed)
BP 138/85   Pulse 64   Temp 97.7 F (36.5 C) (Oral)   Wt 218 lb (98.9 kg)   SpO2 98%   BMI 32.19 kg/m    Subjective:    Patient ID: Mckenzie Webster, female    DOB: Nov 02, 1950, 72 y.o.   MRN: [id number]  HPI: Mckenzie Webster is a 72 y.o. female presenting on 06/19/2023 for comprehensive medical examination. Current medical complaints include:  Doing well with her vyvanse. No side effects. Feels like her binge eating is doing well. Held on doing cardiac CT score with cardiology. Still feeling very SOB with exertion. Echo was normal.   HYPERTENSION  Hypertension status: controlled  Satisfied with current treatment? yes Duration of hypertension: chronic BP monitoring frequency:  not checking BP medication side effects:  no Medication compliance: good compliance Previous BP meds: lisinopril Aspirin: no Recurrent headaches: no Visual changes: no Palpitations: no Dyspnea: no Chest pain: no Lower extremity edema: no Dizzy/lightheaded: no  She currently lives with: alone Menopausal Symptoms: no  Depression Screen done today and results listed below:     06/19/2023    9:07 AM 03/19/2023    8:50 AM 01/08/2023   10:32 AM 12/26/2022    8:17 AM 12/12/2022    9:06 AM  Depression screen PHQ 2/9  Decreased Interest 0 0 0 0 0  Down, Depressed, Hopeless 0 0 0 0 0  PHQ - 2 Score 0 0 0 0 0  Altered sleeping 0 0 0 0 0  Tired, decreased energy 0 0 0 0 0  Change in appetite 0 0 0 0 0  Feeling bad or failure about yourself  0 0 0 0 0  Trouble concentrating 0 0 0 0 0  Moving slowly or fidgety/restless 0 0 0 0 0  Suicidal thoughts 0 0 0 0 0  PHQ-9 Score 0 0 0 0 0  Difficult doing work/chores Not difficult at all Not difficult at all Not difficult at all Not difficult at all Not difficult at all    Past Medical History:  Past Medical History:  Diagnosis Date   Anemia    Cataract    Dry eye syndrome    Positive colorectal cancer screening using Cologuard test 02/14/2017   Stomach  ulcer     Surgical History:  Past Surgical History:  Procedure Laterality Date   COLONOSCOPY WITH PROPOFOL N/A 04/27/2017   Procedure: COLONOSCOPY WITH PROPOFOL;  Surgeon: Wyline Mood, MD;  [facility] ENDOSCOPY;  Service: Endoscopy;  Laterality: N/A;   COLONOSCOPY WITH PROPOFOL N/A 05/09/2021   Procedure: COLONOSCOPY WITH PROPOFOL;  Surgeon: Wyline Mood, MD;  Location: [facility] ENDOSCOPY;  Service: Gastroenterology;  Laterality: N/A;   EYE SURGERY     Cataract   FOOT SURGERY     Change the alignment of the toes to stop the formation of corns   gastric bypass  1981    Medications:  Current Outpatient Medications on File Prior to Visit  Medication Sig   cetirizine (ZYRTEC) 10 MG tablet Take 10 mg by mouth daily.   diclofenac Sodium (VOLTAREN) 1 % GEL Apply 4 g topically 4 (four) times daily.   ferrous sulfate 325 (65 FE) MG EC tablet Take 1 tablet (325 mg total) by mouth daily.   fluticasone (FLONASE) 50 MCG/ACT nasal spray Place 2 sprays into both nostrils daily.    hydroxychloroquine (PLAQUENIL) 200 MG tablet Take 200 mg by mouth 2 (two) times daily.   No current facility-administered medications on file prior  to visit.    Allergies:  No Known Allergies  Social History:  Social History   Socioeconomic History   Marital status: Divorced    Spouse name: Not on file   Number of children: Not on file   Years of education: Not on file   Highest education level: Some college, no degree  Occupational History   Occupation: Occupational psychologist part time   Tobacco Use   Smoking status: Former    Current packs/day: 0.00    Types: Cigarettes    Quit date: 10/30/1989    Years since quitting: 33.6   Smokeless tobacco: Never  Vaping Use   Vaping status: Never Used  Substance and Sexual Activity   Alcohol use: Not Currently    Comment: on occasion   Drug use: No   Sexual activity: Not Currently  Other Topics Concern   Not on file  Social History Narrative   Not on file   Social  Determinants of Health   Financial Resource Strain: Low Risk  (03/15/2023)   Overall Financial Resource Strain (CARDIA)    Difficulty of Paying Living Expenses: Not very hard  Food Insecurity: No Food Insecurity (03/15/2023)   Hunger Vital Sign    Worried About Running Out of Food in the Last Year: Never true    Ran Out of Food in the Last Year: Never true  Transportation Needs: No Transportation Needs (03/15/2023)   PRAPARE - Administrator, Civil Service (Medical): No    Lack of Transportation (Non-Medical): No  Physical Activity: Inactive (03/15/2023)   Exercise Vital Sign    Days of Exercise per Week: 0 days    Minutes of Exercise per Session: 0 min  Stress: No Stress Concern Present (03/15/2023)   [employer] of Occupational Health - Occupational Stress Questionnaire    Feeling of Stress : Not at all  Social Connections: Moderately Integrated (03/15/2023)   Social Connection and Isolation Panel [NHANES]    Frequency of Communication with Friends and Family: More than three times a week    Frequency of Social Gatherings with Friends and Family: More than three times a week    Attends Religious Services: More than 4 times per year    Active Member of [employer] or Organizations: Yes    Attends Engineer, structural: More than 4 times per year    Marital Status: Divorced  Recent Concern: Social Connections - Moderately Isolated (12/26/2022)   Social Connection and Isolation Panel [NHANES]    Frequency of Communication with Friends and Family: More than three times a week    Frequency of Social Gatherings with Friends and Family: Three times a week    Attends Religious Services: More than 4 times per year    Active Member of Clubs or Organizations: No    Attends Banker Meetings: Never    Marital Status: Divorced  Catering manager Violence: Not At Risk (01/03/2023)   Humiliation, Afraid, Rape, and Kick questionnaire    Fear of Current or Ex-Partner: No     Emotionally Abused: No    Physically Abused: No    Sexually Abused: No   Social History   Tobacco Use  Smoking Status Former   Current packs/day: 0.00   Types: Cigarettes   Quit date: 10/30/1989   Years since quitting: 33.6  Smokeless Tobacco Never   Social History   Substance and Sexual Activity  Alcohol Use Not Currently   Comment: on occasion    Family History:  Family History  Problem Relation Age of Onset   Heart disease Mother    COPD Mother    Heart disease Father    Hypertension Father    Heart disease Maternal Grandfather    Alzheimer's disease Paternal Grandmother    Breast cancer Neg Hx     Past medical history, surgical history, medications, allergies, family history and social history reviewed with patient today and changes made to appropriate areas of the chart.   Review of Systems  Constitutional:  Positive for chills and malaise/fatigue. Negative for diaphoresis, fever and weight loss.  HENT: Negative.    Eyes: Negative.   Respiratory:  Positive for shortness of breath. Negative for cough, hemoptysis, sputum production and wheezing.   Cardiovascular: Negative.   Gastrointestinal: Negative.   Genitourinary: Negative.   Musculoskeletal: Negative.   Skin: Negative.   Neurological:  Positive for tingling (both feet- chronic). Negative for dizziness, tremors, sensory change, speech change, focal weakness, seizures, loss of consciousness, weakness and headaches.  Endo/Heme/Allergies:  Negative for environmental allergies and polydipsia. Bruises/bleeds easily.  Psychiatric/Behavioral: Negative.     All other ROS negative except what is listed above and in the HPI.      Objective:    BP 138/85   Pulse 64   Temp 97.7 F (36.5 C) (Oral)   Wt 218 lb (98.9 kg)   SpO2 98%   BMI 32.19 kg/m   Wt Readings from Last 3 Encounters:  06/19/23 218 lb (98.9 kg)  04/19/23 222 lb (100.7 kg)  03/19/23 215 lb 6.4 oz (97.7 kg)    Physical Exam Vitals and  nursing note reviewed.  Constitutional:      General: She is not in acute distress.    Appearance: Normal appearance. She is not ill-appearing, toxic-appearing or diaphoretic.  HENT:     Head: Normocephalic and atraumatic.     Right Ear: Tympanic membrane, ear canal and external ear normal. There is no impacted cerumen.     Left Ear: Tympanic membrane, ear canal and external ear normal. There is no impacted cerumen.     Nose: Nose normal. No congestion or rhinorrhea.     Mouth/Throat:     Mouth: Mucous membranes are moist.     Pharynx: Oropharynx is clear. No oropharyngeal exudate or posterior oropharyngeal erythema.  Eyes:     General: No scleral icterus.       Right eye: No discharge.        Left eye: No discharge.     Extraocular Movements: Extraocular movements intact.     Conjunctiva/sclera: Conjunctivae normal.     Pupils: Pupils are equal, round, and reactive to light.  Neck:     Vascular: No carotid bruit.  Cardiovascular:     Rate and Rhythm: Normal rate and regular rhythm.     Pulses: Normal pulses.     Heart sounds: No murmur heard.    No friction rub. No gallop.  Pulmonary:     Effort: Pulmonary effort is normal. No respiratory distress.     Breath sounds: Normal breath sounds. No stridor. No wheezing, rhonchi or rales.  Chest:     Chest wall: No tenderness.  Abdominal:     General: Abdomen is flat. Bowel sounds are normal. There is no distension.     Palpations: Abdomen is soft. There is no mass.     Tenderness: There is no abdominal tenderness. There is no right CVA tenderness, left CVA tenderness, guarding or rebound.     Hernia:  No hernia is present.  Genitourinary:    Comments: Breast and pelvic exams deferred with shared decision making Musculoskeletal:        General: No swelling, tenderness, deformity or signs of injury.     Cervical back: Normal range of motion and neck supple. No rigidity. No muscular tenderness.     Right lower leg: No edema.     Left  lower leg: No edema.  Lymphadenopathy:     Cervical: No cervical adenopathy.  Skin:    General: Skin is warm and dry.     Capillary Refill: Capillary refill takes less than 2 seconds.     Coloration: Skin is not jaundiced or pale.     Findings: No bruising, erythema, lesion or rash.  Neurological:     General: No focal deficit present.     Mental Status: She is alert and oriented to person, place, and time. Mental status is at baseline.     Cranial Nerves: No cranial nerve deficit.     Sensory: No sensory deficit.     Motor: No weakness.     Coordination: Coordination normal.     Gait: Gait normal.     Deep Tendon Reflexes: Reflexes normal.  Psychiatric:        Mood and Affect: Mood normal.        Behavior: Behavior normal.        Thought Content: Thought content normal.        Judgment: Judgment normal.     Results for orders placed or performed in visit on 06/19/23  CBC with Differential/Platelet  Result Value Ref Range   WBC 3.8 3.4 - 10.8 x10E3/uL   RBC 4.56 3.77 - 5.28 x10E6/uL   Hemoglobin 13.6 11.1 - 15.9 g/dL   Hematocrit 14.7 82.9 - 46.6 %   MCV 91 79 - 97 fL   MCH 29.8 26.6 - 33.0 pg   MCHC 32.8 31.5 - 35.7 g/dL   RDW 56.2 13.0 - 86.5 %   Platelets 215 150 - 450 x10E3/uL   Neutrophils 59 Not Estab. %   Lymphs 23 Not Estab. %   Monocytes 10 Not Estab. %   Eos 7 Not Estab. %   Basos 1 Not Estab. %   Neutrophils Absolute 2.2 1.4 - 7.0 x10E3/uL   Lymphocytes Absolute 0.9 0.7 - 3.1 x10E3/uL   Monocytes Absolute 0.4 0.1 - 0.9 x10E3/uL   EOS (ABSOLUTE) 0.3 0.0 - 0.4 x10E3/uL   Basophils Absolute 0.0 0.0 - 0.2 x10E3/uL   Immature Granulocytes 0 Not Estab. %   Immature Grans (Abs) 0.0 0.0 - 0.1 x10E3/uL  Comprehensive metabolic panel  Result Value Ref Range   Glucose 74 70 - 99 mg/dL   BUN 27 8 - 27 mg/dL   Creatinine, Ser 7.84 0.57 - 1.00 mg/dL   eGFR 60 >69 GE/XBM/8.41   BUN/Creatinine Ratio 27 12 - 28   Sodium 143 134 - 144 mmol/L   Potassium 4.2 3.5 - 5.2  mmol/L   Chloride 107 (H) 96 - 106 mmol/L   CO2 23 20 - 29 mmol/L   Calcium 9.6 8.7 - 10.3 mg/dL   Total Protein 6.3 6.0 - 8.5 g/dL   Albumin 4.2 3.8 - 4.8 g/dL   Globulin, Total 2.1 1.5 - 4.5 g/dL   Bilirubin Total 0.4 0.0 - 1.2 mg/dL   Alkaline Phosphatase 100 44 - 121 IU/L   AST 21 0 - 40 IU/L   ALT 11 0 - 32 IU/L  Lipid  Panel w/o Chol/HDL Ratio  Result Value Ref Range   Cholesterol, Total 168 100 - 199 mg/dL   Triglycerides 79 0 - 149 mg/dL   HDL 70 >78 mg/dL   VLDL Cholesterol Cal 15 5 - 40 mg/dL   LDL Chol Calc (NIH) 83 0 - 99 mg/dL  TSH  Result Value Ref Range   TSH 4.910 (H) 0.450 - 4.500 uIU/mL  Microalbumin, Urine Waived  Result Value Ref Range   Microalb, Ur Waived 80 (H) 0 - 19 mg/L   Creatinine, Urine Waived 200 10 - 300 mg/dL   Microalb/Creat Ratio <30 <30 mg/g  Ferritin  Result Value Ref Range   Ferritin 61 15 - 150 ng/mL  Iron Binding Cap (TIBC)([facility])  Result Value Ref Range   Total Iron Binding Capacity 284 250 - 450 ug/dL   UIBC 295 621 - 308 ug/dL   Iron 55 27 - 657 ug/dL   Iron Saturation 19 15 - 55 %      Assessment & Plan:   Problem List Items Addressed This Visit       Cardiovascular and Mediastinum   Aortic atherosclerosis (HCC) (Chronic)    Will keep BP and cholesterol under good control. Continue to monitor. Call with any concerns.       Relevant Medications   lisinopril (ZESTRIL) 10 MG tablet   Other Relevant Orders   CBC with Differential/Platelet (Completed)   Comprehensive metabolic panel (Completed)   Lipid Panel w/o Chol/HDL Ratio (Completed)   Senile purpura (HCC)    Reassured patient. Continue to monitor.       Relevant Medications   lisinopril (ZESTRIL) 10 MG tablet   Other Relevant Orders   CBC with Differential/Platelet (Completed)   Comprehensive metabolic panel (Completed)     Genitourinary   Benign hypertensive renal disease (Chronic)    Under good control on current regimen. Continue current regimen.  Continue to monitor. Call with any concerns. Refills given. Labs drawn today.        Relevant Orders   CBC with Differential/Platelet (Completed)   Comprehensive metabolic panel (Completed)   TSH (Completed)   Microalbumin, Urine Waived (Completed)     Other   Iron deficiency anemia    Rechecking labs today. Await results. Treat as needed.       Relevant Orders   CBC with Differential/Platelet (Completed)   Comprehensive metabolic panel (Completed)   Ferritin (Completed)   Iron Binding Cap (TIBC)([facility]) (Completed)   Binge eating disorder    Under good control on current regimen. Continue current regimen. Continue to monitor. Call with any concerns. Refills given for 3 months. Follow up 3 months.        Depression, recurrent (HCC)    Stable not on medicine. Refuses any other medicine. Continue to monitor.       Relevant Orders   CBC with Differential/Platelet (Completed)   Comprehensive metabolic panel (Completed)   Class 1 obesity due to excess calories with serious comorbidity and body mass index (BMI) of 33.0 to 33.9 in adult    Discussed that wegovy is not covered for straight weight loss. She will consider getting CT calcium score. Continue diet and exercise. Call with any concerns.       Relevant Medications   lisdexamfetamine (VYVANSE) 60 MG capsule   lisdexamfetamine (VYVANSE) 60 MG capsule (Start on 08/20/2023)   lisdexamfetamine (VYVANSE) 60 MG capsule (Start on 07/21/2023)   Other Visit Diagnoses     Routine general medical examination at a health  care facility    -  Primary   Vaccines up to date. Screening labs checked today. Mammo, colonoscopy and DEXA up to date. Continue diet and exercise. Call with any concerns.        Follow up plan: Return in about 3 months (around 09/19/2023).   LABORATORY TESTING:  - Pap smear: not applicable  IMMUNIZATIONS:   - Tdap: Tetanus vaccination status reviewed: last tetanus booster within 10 years. -  Influenza: Postponed to flu season - Pneumovax: Up to date - Prevnar: Up to date - COVID: Refused - HPV: Not applicable - Shingrix vaccine: Up to date  SCREENING: -Mammogram: Up to date  - Colonoscopy: Up to date  - Bone Density: Up to date   PATIENT COUNSELING:   Advised to take 1 mg of folate supplement per day if capable of pregnancy.   Sexuality: Discussed sexually transmitted diseases, partner selection, use of condoms, avoidance of unintended pregnancy  and contraceptive alternatives.   Advised to avoid cigarette smoking.  I discussed with the patient that most people either abstain from alcohol or drink within safe limits (<=14/week and <=4 drinks/occasion for males, <=7/weeks and <= 3 drinks/occasion for females) and that the risk for alcohol disorders and other health effects rises proportionally with the number of drinks per week and how often a drinker exceeds daily limits.  Discussed cessation/primary prevention of drug use and availability of treatment for abuse.   Diet: Encouraged to adjust caloric intake to maintain  or achieve ideal body weight, to reduce intake of dietary saturated fat and total fat, to limit sodium intake by avoiding high sodium foods and not adding table salt, and to maintain adequate dietary potassium and calcium preferably from fresh fruits, vegetables, and low-fat dairy products.    stressed the importance of regular exercise  Injury prevention: Discussed safety belts, safety helmets, smoke detector, smoking near bedding or upholstery.   Dental health: Discussed importance of regular tooth brushing, flossing, and dental visits.    NEXT PREVENTATIVE PHYSICAL DUE IN 1 YEAR. Return in about 3 months (around 09/19/2023).

## 2023-06-20 ENCOUNTER — Other Ambulatory Visit: Payer: Self-pay | Admitting: Family Medicine

## 2023-06-20 LAB — LIPID PANEL W/O CHOL/HDL RATIO
Cholesterol, Total: 168 mg/dL (ref 100–199)
HDL: 70 mg/dL (ref >39)
LDL Chol Calc (NIH): 83 mg/dL (ref 0–99)
Triglycerides: 79 mg/dL (ref 0–149)
VLDL Cholesterol Cal: 15 mg/dL (ref 5–40)

## 2023-06-20 LAB — CBC WITH DIFFERENTIAL/PLATELET
Basophils Absolute: 0 10*3/uL (ref 0.0–0.2)
Basos: 1 %
EOS (ABSOLUTE): 0.3 10*3/uL (ref 0.0–0.4)
Eos: 7 %
Hematocrit: 41.5 % (ref 34.0–46.6)
Hemoglobin: 13.6 g/dL (ref 11.1–15.9)
Immature Grans (Abs): 0 10*3/uL (ref 0.0–0.1)
Immature Granulocytes: 0 %
Lymphocytes Absolute: 0.9 10*3/uL (ref 0.7–3.1)
Lymphs: 23 %
MCH: 29.8 pg (ref 26.6–33.0)
MCHC: 32.8 g/dL (ref 31.5–35.7)
MCV: 91 fL (ref 79–97)
Monocytes Absolute: 0.4 10*3/uL (ref 0.1–0.9)
Monocytes: 10 %
Neutrophils Absolute: 2.2 10*3/uL (ref 1.4–7.0)
Neutrophils: 59 %
Platelets: 215 10*3/uL (ref 150–450)
RBC: 4.56 x10E6/uL (ref 3.77–5.28)
RDW: 13.1 % (ref 11.7–15.4)
WBC: 3.8 10*3/uL (ref 3.4–10.8)

## 2023-06-20 LAB — COMPREHENSIVE METABOLIC PANEL
ALT: 11 IU/L (ref 0–32)
AST: 21 IU/L (ref 0–40)
Albumin: 4.2 g/dL (ref 3.8–4.8)
Alkaline Phosphatase: 100 IU/L (ref 44–121)
BUN/Creatinine Ratio: 27 (ref 12–28)
BUN: 27 mg/dL (ref 8–27)
Bilirubin Total: 0.4 mg/dL (ref 0.0–1.2)
CO2: 23 mmol/L (ref 20–29)
Calcium: 9.6 mg/dL (ref 8.7–10.3)
Chloride: 107 mmol/L — ABNORMAL HIGH (ref 96–106)
Creatinine, Ser: 1 mg/dL (ref 0.57–1.00)
Globulin, Total: 2.1 g/dL (ref 1.5–4.5)
Glucose: 74 mg/dL (ref 70–99)
Potassium: 4.2 mmol/L (ref 3.5–5.2)
Sodium: 143 mmol/L (ref 134–144)
Total Protein: 6.3 g/dL (ref 6.0–8.5)
eGFR: 60 mL/min/{1.73_m2} (ref >59)

## 2023-06-20 LAB — IRON AND TIBC
Iron Saturation: 19 % (ref 15–55)
Iron: 55 ug/dL (ref 27–139)
Total Iron Binding Capacity: 284 ug/dL (ref 250–450)
UIBC: 229 ug/dL (ref 118–369)

## 2023-06-20 LAB — TSH: TSH: 4.91 u[IU]/mL — ABNORMAL HIGH (ref 0.450–4.500)

## 2023-06-20 LAB — FERRITIN: Ferritin: 61 ng/mL (ref 15–150)

## 2023-06-21 MED ORDER — ALBUTEROL SULFATE HFA 108 (90 BASE) MCG/ACT IN AERS: 2.0000 | INHALATION_SPRAY | Freq: Four times a day (QID) | RESPIRATORY_TRACT | 1 refills | Status: DC | PRN

## 2023-06-21 MED ORDER — LISINOPRIL 10 MG PO TABS: 10.0000 mg | ORAL_TABLET | Freq: Every day | ORAL | 1 refills | Status: DC

## 2023-06-21 MED ORDER — FAMOTIDINE 20 MG PO TABS: 20.0000 mg | ORAL_TABLET | Freq: Two times a day (BID) | ORAL | 1 refills | Status: DC

## 2023-06-21 MED ORDER — LISDEXAMFETAMINE DIMESYLATE 60 MG PO CAPS: 60.0000 mg | ORAL_CAPSULE | ORAL | 0 refills | Status: DC

## 2023-06-21 MED ORDER — NAPROXEN 500 MG PO TABS: 500.0000 mg | ORAL_TABLET | Freq: Two times a day (BID) | ORAL | 2 refills | Status: DC

## 2023-06-21 NOTE — Telephone Encounter (Signed)
Requested medication (s) are due for refill today: yes  Requested medication (s) are on the active medication list: yes  Last refill:  03/19/23  Future visit scheduled: yes  Notes to clinic:  Unable to refill per protocol, cannot delegate.      Requested Prescriptions  Pending Prescriptions Disp Refills   VYVANSE 60 MG capsule [Pharmacy Med Name: VYVANSE 60 MG CAP] 30 capsule     Sig: TAKE 1 CAPSULE BY MOUTH EVERY MORNING     Not Delegated - Psychiatry:  Stimulants/ADHD Failed - 06/20/2023 12:31 PM      Failed - This refill cannot be delegated      Failed - Urine Drug Screen completed in last 360 days      Passed - Last BP in normal range    BP Readings from Last 1 Encounters:  06/19/23 138/85         Passed - Last Heart Rate in normal range    Pulse Readings from Last 1 Encounters:  06/19/23 64         Passed - Valid encounter within last 6 months    Recent Outpatient Visits           2 days ago Routine general medical examination at a health care facility   [facility], Megan P, DO   3 months ago Binge eating disorder   [place] [facility] [place], Megan P, DO   5 months ago Cellulitis of left hand   [place] [facility] [place], Megan P, DO   5 months ago Viral upper respiratory tract infection   [place] [facility], Oswaldo Conroy, PA-C   6 months ago Aortic atherosclerosis [facility]   [place] [facility] [place], Oralia Rud, DO       Future Appointments             In 3 months Laural Benes, Oralia Rud, DO [facility]

## 2023-06-24 MED ORDER — LISDEXAMFETAMINE DIMESYLATE 60 MG PO CAPS: 60.0000 mg | ORAL_CAPSULE | ORAL | 0 refills | Status: DC

## 2023-06-24 NOTE — Assessment & Plan Note (Signed)
Rechecking labs today. Await results. Treat as needed.  °

## 2023-06-24 NOTE — Assessment & Plan Note (Signed)
Stable not on medicine. Refuses any other medicine. Continue to monitor.

## 2023-06-24 NOTE — Assessment & Plan Note (Signed)
Under good control on current regimen. Continue current regimen. Continue to monitor. Call with any concerns. Refills given for 3 months. Follow up 3 months.    

## 2023-06-24 NOTE — Assessment & Plan Note (Signed)
Will keep BP and cholesterol under good control. Continue to monitor. Call with any concerns.  

## 2023-06-24 NOTE — Assessment & Plan Note (Signed)
Under good control on current regimen. Continue current regimen. Continue to monitor. Call with any concerns. Refills given. Labs drawn today.   

## 2023-06-24 NOTE — Assessment & Plan Note (Signed)
Reassured patient. Continue to monitor.  

## 2023-06-24 NOTE — Assessment & Plan Note (Signed)
Discussed that wegovy is not covered for straight weight loss. She will consider getting CT calcium score. Continue diet and exercise. Call with any concerns.

## 2023-06-26 ENCOUNTER — Other Ambulatory Visit: Payer: Self-pay | Admitting: Cardiology

## 2023-06-27 ENCOUNTER — Other Ambulatory Visit (HOSPITAL_COMMUNITY): Payer: Self-pay | Admitting: *Deleted

## 2023-06-27 ENCOUNTER — Telehealth (HOSPITAL_COMMUNITY): Payer: Self-pay | Admitting: *Deleted

## 2023-06-27 MED ORDER — METOPROLOL TARTRATE 50 MG PO TABS: ORAL_TABLET | ORAL | 0 refills | Status: DC

## 2023-06-27 NOTE — Telephone Encounter (Signed)
Patient calling about her upcoming cardiac imaging study; pt verbalizes understanding of appt date/time, parking situation and where to check in, pre-test NPO status and medications ordered, and verified current allergies; name and call back number provided for further questions should they arise  Gordy Clement RN Navigator Cardiac Imaging [facility] [phone] office [phone] cell  Patient to take 50mg  metoprolol tartrate two hours prior to her cardiac CT scan.

## 2023-06-28 ENCOUNTER — Ambulatory Visit
Admission: RE | Admit: 2023-06-28 | Discharge: 2023-06-28 | Disposition: A | Discharge status: Home / Self Care | Payer: PPO | Source: Ambulatory Visit | Attending: Cardiology | Admitting: Cardiology

## 2023-06-28 ENCOUNTER — Encounter: Payer: Self-pay | Admitting: Family Medicine

## 2023-06-28 DIAGNOSIS — R072 Precordial pain: Secondary | ICD-10-CM | POA: Diagnosis not present

## 2023-06-28 MED ORDER — SODIUM CHLORIDE 0.9 % IV SOLN: INTRAVENOUS | Status: DC

## 2023-06-28 MED ORDER — IOHEXOL 350 MG/ML SOLN
100.0000 mL | Freq: Once | INTRAVENOUS | Status: AC | PRN
  Administered 2023-06-28: 100 mL via INTRAVENOUS

## 2023-06-28 MED ORDER — NITROGLYCERIN 0.4 MG SL SUBL
0.8000 mg | SUBLINGUAL_TABLET | Freq: Once | SUBLINGUAL | Status: AC
  Administered 2023-06-28: 0.8 mg via SUBLINGUAL

## 2023-06-28 NOTE — Progress Notes (Signed)
Patient tolerated procedure well. Ambulate w/o difficulty. Denies light headedness or being dizzy. Sitting in chair drinking water provided. Encouraged to drink extra water today and reasoning explained. Verbalized understanding. All questions answered. ABC intact. No further needs. Discharge from procedure area w/o issues.   °

## 2023-07-09 ENCOUNTER — Other Ambulatory Visit: Payer: Self-pay | Admitting: Family Medicine

## 2023-07-09 ENCOUNTER — Encounter: Payer: Self-pay | Admitting: Family Medicine

## 2023-07-09 DIAGNOSIS — I2510 Atherosclerotic heart disease of native coronary artery without angina pectoris: Secondary | ICD-10-CM | POA: Insufficient documentation

## 2023-07-09 MED ORDER — SEMAGLUTIDE-WEIGHT MANAGEMENT 1 MG/0.5ML ~~LOC~~ SOAJ: 1.0000 mg | SUBCUTANEOUS | 0 refills | Status: DC

## 2023-07-09 MED ORDER — SEMAGLUTIDE-WEIGHT MANAGEMENT 0.25 MG/0.5ML ~~LOC~~ SOAJ: 0.2500 mg | SUBCUTANEOUS | 0 refills | Status: AC

## 2023-07-09 MED ORDER — SEMAGLUTIDE-WEIGHT MANAGEMENT 0.5 MG/0.5ML ~~LOC~~ SOAJ: 0.5000 mg | SUBCUTANEOUS | 0 refills | Status: AC

## 2023-07-20 ENCOUNTER — Encounter: Payer: Self-pay | Admitting: Family Medicine

## 2023-09-03 ENCOUNTER — Telehealth: Payer: Self-pay | Admitting: Family Medicine

## 2023-09-03 ENCOUNTER — Encounter: Payer: Self-pay | Admitting: Family Medicine

## 2023-09-03 NOTE — Telephone Encounter (Signed)
Spoke with patient and make her aware of Dr Henriette Combs recommendations. Patient was advised to reach out to [facility] pharmacy, and if she does, she would have to reach out to [facility] and have them transferred there. As, she has called around to check the status of prescription and was told they were out of stock. Patient was advised to reach back out to our office if she has any concerns or questions. Verbalized understanding.

## 2023-09-03 NOTE — Telephone Encounter (Signed)
Copied from CRM [id number]. Topic: General - Inquiry >> Sep 03, 2023  9:27 AM Haroldine Laws wrote: Reason for CRM: pt would like a nurse call her back regarding the prescription for [facility].  She said [facility] told her it was on a long term back order.  CB@  [phone]

## 2023-09-17 ENCOUNTER — Other Ambulatory Visit: Payer: Self-pay | Admitting: Family Medicine

## 2023-09-18 NOTE — Telephone Encounter (Signed)
Requested medication (s) are due for refill today: Yes  Requested medication (s) are on the active medication list: Yes  Last refill:  06/24/23  Future visit scheduled: Yes  Notes to clinic:  Not delegated.    Requested Prescriptions  Pending Prescriptions Disp Refills   VYVANSE 60 MG capsule [Pharmacy Med Name: VYVANSE 60 MG CAP] 30 capsule     Sig: TAKE 1 CAPSULE BY MOUTH EVERY MORNING     Not Delegated - Psychiatry:  Stimulants/ADHD Failed - 09/17/2023 11:41 AM      Failed - This refill cannot be delegated      Failed - Urine Drug Screen completed in last 360 days      Passed - Last BP in normal range    BP Readings from Last 1 Encounters:  06/28/23 107/74         Passed - Last Heart Rate in normal range    Pulse Readings from Last 1 Encounters:  06/28/23 62         Passed - Valid encounter within last 6 months    Recent Outpatient Visits           3 months ago Routine general medical examination at a health care facility   [facility], [place] P, DO   6 months ago Binge eating disorder   [place] [facility] [place], Megan P, DO   8 months ago Cellulitis of left hand   [place] [facility] [place], Megan P, DO   8 months ago Viral upper respiratory tract infection   [place] [facility] Mecum, Oswaldo Conroy, PA-C   9 months ago Aortic atherosclerosis [facility])   [place] [facility] [place], Oralia Rud, DO       Future Appointments             Tomorrow Dorcas Carrow, DO [facility]

## 2023-09-18 NOTE — Telephone Encounter (Signed)
Has appointment in the AM, cannot refuse RX on my end.

## 2023-09-19 ENCOUNTER — Encounter: Payer: Self-pay | Admitting: Family Medicine

## 2023-09-19 ENCOUNTER — Ambulatory Visit (INDEPENDENT_AMBULATORY_CARE_PROVIDER_SITE_OTHER): Payer: PPO | Admitting: Family Medicine

## 2023-09-19 VITALS — BP 125/79 | HR 70 | Temp 97.9°F | Resp 16 | Wt 212.6 lb

## 2023-09-19 DIAGNOSIS — F50811 Binge eating disorder, moderate: Secondary | ICD-10-CM | POA: Diagnosis not present

## 2023-09-19 DIAGNOSIS — E66811 Obesity, class 1: Secondary | ICD-10-CM

## 2023-09-19 DIAGNOSIS — Z23 Encounter for immunization: Secondary | ICD-10-CM

## 2023-09-19 DIAGNOSIS — E6609 Other obesity due to excess calories: Secondary | ICD-10-CM | POA: Diagnosis not present

## 2023-09-19 DIAGNOSIS — Z1231 Encounter for screening mammogram for malignant neoplasm of breast: Secondary | ICD-10-CM | POA: Diagnosis not present

## 2023-09-19 DIAGNOSIS — Z6833 Body mass index (BMI) 33.0-33.9, adult: Secondary | ICD-10-CM | POA: Diagnosis not present

## 2023-09-19 DIAGNOSIS — I2510 Atherosclerotic heart disease of native coronary artery without angina pectoris: Secondary | ICD-10-CM | POA: Diagnosis not present

## 2023-09-19 MED ORDER — LISDEXAMFETAMINE DIMESYLATE 60 MG PO CAPS: 60.0000 mg | ORAL_CAPSULE | ORAL | 0 refills | Status: DC

## 2023-09-19 MED ORDER — SEMAGLUTIDE-WEIGHT MANAGEMENT 0.25 MG/0.5ML ~~LOC~~ SOAJ: 0.2500 mg | SUBCUTANEOUS | 3 refills | Status: AC

## 2023-09-19 NOTE — Patient Instructions (Signed)
Please call to schedule your mammogram and/or bone density: [facility]  Address: [address], [place], [place] [address] Phone: [phone]  [facility] [place] [address] [place],  [place]  [address] Phone: [phone]   

## 2023-09-19 NOTE — Assessment & Plan Note (Signed)
Will try to get her started on wegovy to help with weight loss and cardio-protection. Call with any concerns. Recheck 3 months.

## 2023-09-19 NOTE — Assessment & Plan Note (Signed)
Under good control on current regimen. Continue current regimen. Continue to monitor. Call with any concerns. Refills given for 3 months. Follow up 3 months.    

## 2023-09-19 NOTE — Progress Notes (Signed)
BP 125/79 (BP Location: Left Arm, Patient Position: Sitting, Cuff Size: Normal)   Pulse 70   Temp 97.9 F (36.6 C) (Oral)   Resp 16   Wt 212 lb 9.6 oz (96.4 kg)   SpO2 100%   BMI 31.40 kg/m    Subjective:    Patient ID: Mckenzie [place], female    DOB: [birth year], 72 y.o.   MRN: [id number]  HPI: Mckenzie [place] is a 72 y.o. female  Chief Complaint  Patient presents with   Binge Eating Disorder   Feeling well with the vyvanse. She notes that she has been doing better with the binge eating. She notes that she has been binging less. She has been able to lose a bit of weight and feels like she has better stamina and has been able to walk up the stairs easier and is feeling better in general.   OBESITY Duration: chronic Previous attempts at weight loss: yes, diet, exercise, gastric bypass Complications of obesity: HTN, depression, CAD, GERD Peak weight: 222 Weight loss goal: to be healthy  Weight loss to date: 10lbs Requesting obesity pharmacotherapy: yes Current weight loss supplements/medications: no Previous weight loss supplements/meds: no  Relevant past medical, surgical, family and social history reviewed and updated as indicated. Interim medical history since our last visit reviewed. Allergies and medications reviewed and updated.  Review of Systems  Constitutional: Negative.   Respiratory: Negative.    Cardiovascular: Negative.   Gastrointestinal: Negative.   Musculoskeletal: Negative.   Psychiatric/Behavioral: Negative.      Per HPI unless specifically indicated above     Objective:    BP 125/79 (BP Location: Left Arm, Patient Position: Sitting, Cuff Size: Normal)   Pulse 70   Temp 97.9 F (36.6 C) (Oral)   Resp 16   Wt 212 lb 9.6 oz (96.4 kg)   SpO2 100%   BMI 31.40 kg/m   Wt Readings from Last 3 Encounters:  09/19/23 212 lb 9.6 oz (96.4 kg)  06/19/23 218 lb (98.9 kg)  04/19/23 222 lb (100.7 kg)    Physical Exam Vitals and nursing note reviewed.   Constitutional:      General: She is not in acute distress.    Appearance: Normal appearance. She is obese. She is not ill-appearing, toxic-appearing or diaphoretic.  HENT:     Head: Normocephalic and atraumatic.     Right Ear: External ear normal.     Left Ear: External ear normal.     Nose: Nose normal.     Mouth/Throat:     Mouth: Mucous membranes are moist.     Pharynx: Oropharynx is clear.  Eyes:     General: No scleral icterus.       Right eye: No discharge.        Left eye: No discharge.     Extraocular Movements: Extraocular movements intact.     Conjunctiva/sclera: Conjunctivae normal.     Pupils: Pupils are equal, round, and reactive to light.  Cardiovascular:     Rate and Rhythm: Normal rate and regular rhythm.     Pulses: Normal pulses.     Heart sounds: Normal heart sounds. No murmur heard.    No friction rub. No gallop.  Pulmonary:     Effort: Pulmonary effort is normal. No respiratory distress.     Breath sounds: Normal breath sounds. No stridor. No wheezing, rhonchi or rales.  Chest:     Chest wall: No tenderness.  Musculoskeletal:  General: Normal range of motion.     Cervical back: Normal range of motion and neck supple.  Skin:    General: Skin is warm and dry.     Capillary Refill: Capillary refill takes less than 2 seconds.     Coloration: Skin is not jaundiced or pale.     Findings: No bruising, erythema, lesion or rash.  Neurological:     General: No focal deficit present.     Mental Status: She is alert and oriented to person, place, and time. Mental status is at baseline.  Psychiatric:        Mood and Affect: Mood normal.        Behavior: Behavior normal.        Thought Content: Thought content normal.        Judgment: Judgment normal.     Results for orders placed or performed in visit on 06/19/23  CBC with Differential/Platelet  Result Value Ref Range   WBC 3.8 3.4 - 10.8 x10E3/uL   RBC 4.56 3.77 - 5.28 x10E6/uL   Hemoglobin 13.6  11.1 - 15.9 g/dL   Hematocrit 40.9 81.1 - 46.6 %   MCV 91 79 - 97 fL   MCH 29.8 26.6 - 33.0 pg   MCHC 32.8 31.5 - 35.7 g/dL   RDW 91.4 78.2 - 95.6 %   Platelets 215 150 - 450 x10E3/uL   Neutrophils 59 Not Estab. %   Lymphs 23 Not Estab. %   Monocytes 10 Not Estab. %   Eos 7 Not Estab. %   Basos 1 Not Estab. %   Neutrophils Absolute 2.2 1.4 - 7.0 x10E3/uL   Lymphocytes Absolute 0.9 0.7 - 3.1 x10E3/uL   Monocytes Absolute 0.4 0.1 - 0.9 x10E3/uL   EOS (ABSOLUTE) 0.3 0.0 - 0.4 x10E3/uL   Basophils Absolute 0.0 0.0 - 0.2 x10E3/uL   Immature Granulocytes 0 Not Estab. %   Immature Grans (Abs) 0.0 0.0 - 0.1 x10E3/uL  Comprehensive metabolic panel  Result Value Ref Range   Glucose 74 70 - 99 mg/dL   BUN 27 8 - 27 mg/dL   Creatinine, Ser 2.13 0.57 - 1.00 mg/dL   eGFR 60 >08 MV/HQI/6.96   BUN/Creatinine Ratio 27 12 - 28   Sodium 143 134 - 144 mmol/L   Potassium 4.2 3.5 - 5.2 mmol/L   Chloride 107 (H) 96 - 106 mmol/L   CO2 23 20 - 29 mmol/L   Calcium 9.6 8.7 - 10.3 mg/dL   Total Protein 6.3 6.0 - 8.5 g/dL   Albumin 4.2 3.8 - 4.8 g/dL   Globulin, Total 2.1 1.5 - 4.5 g/dL   Bilirubin Total 0.4 0.0 - 1.2 mg/dL   Alkaline Phosphatase 100 44 - 121 IU/L   AST 21 0 - 40 IU/L   ALT 11 0 - 32 IU/L  Lipid Panel w/o Chol/HDL Ratio  Result Value Ref Range   Cholesterol, Total 168 100 - 199 mg/dL   Triglycerides 79 0 - 149 mg/dL   HDL 70 >29 mg/dL   VLDL Cholesterol Cal 15 5 - 40 mg/dL   LDL Chol Calc (NIH) 83 0 - 99 mg/dL  TSH  Result Value Ref Range   TSH 4.910 (H) 0.450 - 4.500 uIU/mL  Microalbumin, Urine Waived  Result Value Ref Range   Microalb, Ur Waived 80 (H) 0 - 19 mg/L   Creatinine, Urine Waived 200 10 - 300 mg/dL   Microalb/Creat Ratio <30 <30 mg/g  Ferritin  Result Value Ref Range  Ferritin 61 15 - 150 ng/mL  Iron Binding Cap (TIBC)([facility])  Result Value Ref Range   Total Iron Binding Capacity 284 250 - 450 ug/dL   UIBC 409 811 - 914 ug/dL   Iron 55 27 - 782  ug/dL   Iron Saturation 19 15 - 55 %      Assessment & Plan:   Problem List Items Addressed This Visit       Cardiovascular and Mediastinum   CAD (coronary artery disease)    Will try to get her started on wegovy to help with weight loss and cardio-protection. Call with any concerns. Recheck 3 months.       Relevant Medications   Semaglutide-Weight Management 0.25 MG/0.5ML SOAJ     Other   Binge eating disorder - Primary    Under good control on current regimen. Continue current regimen. Continue to monitor. Call with any concerns. Refills given for 3 months. Follow up 3 months.        Class 1 obesity due to excess calories with serious comorbidity and body mass index (BMI) of 33.0 to 33.9 in adult    Will try to get her started on wegovy to help with weight loss and cardio-protection. Call with any concerns. Recheck 3 months.       Relevant Medications   Semaglutide-Weight Management 0.25 MG/0.5ML SOAJ   lisdexamfetamine (VYVANSE) 60 MG capsule   Other Visit Diagnoses     Encounter for screening mammogram for malignant neoplasm of breast       Mammo ordered.   Relevant Orders   MM 3D SCREENING MAMMOGRAM BILATERAL BREAST   Needs flu shot       Flu shot given today.   Relevant Orders   Flu Vaccine Trivalent High Dose (Fluad) (Completed)        Follow up plan: Return in about 3 months (around 12/20/2023).

## 2023-10-31 ENCOUNTER — Encounter: Payer: Self-pay | Admitting: Family Medicine

## 2023-11-07 ENCOUNTER — Telehealth: Payer: Self-pay | Admitting: Family Medicine

## 2023-11-07 NOTE — Telephone Encounter (Signed)
 Medication Refill -  Most Recent Primary Care Visit:  Provider: VICCI DUWAINE SQUIBB  Department: [facility]  Visit Type: OFFICE VISIT  Date: 09/19/2023  Medication: zepbound    (pt wants to try this medicine and needs prior auth)Has the patient contacted their pharmacy? No   Is this the correct pharmacy for this prescription? No  This is the patient's preferred pharmacy:   [facility] NPI [id number] NCPDT [id number] Phone     Has the prescription been filled recently? No  Is the patient out of the medication? No  Has the patient been seen for an appointment in the last year OR does the patient have an upcoming appointment? Yes  Can we respond through MyChart? Yes  Agent: Please be advised that Rx refills may take up to 3 business days. We ask that you follow-up with your pharmacy.

## 2023-11-07 NOTE — Telephone Encounter (Signed)
 Pt is wanting a rx for zepbound  and wants it to go directly to [facility] and she is going to pay out of pocket. She could not provide me with phone or fax and said she would call back with information. She wanted to see if Duwaine Louder would be willing to write it for her.

## 2023-11-08 NOTE — Telephone Encounter (Signed)
 I can write her a Rx, but to my knowledge I don't think she can buy it directly from the company.

## 2023-11-09 NOTE — Telephone Encounter (Signed)
 Reached out to patient on MyChart. Advised patient to reach out to our office. If she has any other questions or concerns.

## 2023-11-12 MED ORDER — ZEPBOUND 2.5 MG/0.5ML ~~LOC~~ SOLN: 2.5000 mg | SUBCUTANEOUS | 1 refills | Status: DC

## 2023-11-12 NOTE — Addendum Note (Signed)
 Addended by: Dorcas Carrow on: 11/12/2023 03:45 PM   Modules accepted: Orders

## 2023-11-13 ENCOUNTER — Encounter: Payer: Self-pay | Admitting: Family Medicine

## 2023-11-14 ENCOUNTER — Ambulatory Visit
Admission: RE | Admit: 2023-11-14 | Discharge: 2023-11-14 | Disposition: A | Discharge status: Home / Self Care | Payer: PPO | Source: Ambulatory Visit | Attending: Family Medicine | Admitting: Family Medicine

## 2023-11-14 DIAGNOSIS — Z1231 Encounter for screening mammogram for malignant neoplasm of breast: Secondary | ICD-10-CM | POA: Diagnosis not present

## 2023-11-27 DIAGNOSIS — D225 Melanocytic nevi of trunk: Secondary | ICD-10-CM | POA: Diagnosis not present

## 2023-11-27 DIAGNOSIS — Z08 Encounter for follow-up examination after completed treatment for malignant neoplasm: Secondary | ICD-10-CM | POA: Diagnosis not present

## 2023-11-27 DIAGNOSIS — L920 Granuloma annulare: Secondary | ICD-10-CM | POA: Diagnosis not present

## 2023-11-27 DIAGNOSIS — Z85828 Personal history of other malignant neoplasm of skin: Secondary | ICD-10-CM | POA: Diagnosis not present

## 2023-12-04 ENCOUNTER — Other Ambulatory Visit: Payer: Self-pay | Admitting: Family Medicine

## 2023-12-05 NOTE — Telephone Encounter (Signed)
 Reordered 06/21/23 #90 1 RF  Requested Prescriptions  Refused Prescriptions Disp Refills   lisinopril  (ZESTRIL ) 10 MG tablet [Pharmacy Med Name: LISINOPRIL  10 MG TAB] 90 tablet 1    Sig: TAKE 1 TABLET BY MOUTH DAILY.     Cardiovascular:  ACE Inhibitors Passed - 12/05/2023  4:26 PM      Passed - Cr in normal range and within 180 days    Creatinine, Ser  Date Value Ref Range Status  06/19/2023 1.00 0.57 - 1.00 mg/dL Final         Passed - K in normal range and within 180 days    Potassium  Date Value Ref Range Status  06/19/2023 4.2 3.5 - 5.2 mmol/L Final         Passed - Patient is not pregnant      Passed - Last BP in normal range    BP Readings from Last 1 Encounters:  09/19/23 125/79         Passed - Valid encounter within last 6 months    Recent Outpatient Visits           2 months ago Moderate binge-eating disorder   [place] [facility] [place], Megan P, DO   5 months ago Routine general medical examination at a health care facility   [facility], [place] P, DO   8 months ago Binge eating disorder   [place] [facility] [place], Megan P, DO   11 months ago Cellulitis of left hand   [facility] [place], Megan P, DO   11 months ago Viral upper respiratory tract infection   [place] [facility] Mecum, Rocky BRAVO, PA-C       Future Appointments             In 2 days Vicci, Duwaine SQUIBB, DO [facility]   In 2 weeks Vicci, Duwaine SQUIBB, DO  [facility], [facility]

## 2023-12-07 ENCOUNTER — Ambulatory Visit: Payer: PPO | Admitting: Family Medicine

## 2023-12-07 ENCOUNTER — Encounter: Payer: Self-pay | Admitting: Family Medicine

## 2023-12-07 ENCOUNTER — Telehealth: Payer: Self-pay | Admitting: Family Medicine

## 2023-12-07 VITALS — BP 114/73 | HR 68 | Ht 69.0 in | Wt 208.4 lb

## 2023-12-07 DIAGNOSIS — I129 Hypertensive chronic kidney disease with stage 1 through stage 4 chronic kidney disease, or unspecified chronic kidney disease: Secondary | ICD-10-CM | POA: Diagnosis not present

## 2023-12-07 DIAGNOSIS — F50811 Binge eating disorder, moderate: Secondary | ICD-10-CM

## 2023-12-07 DIAGNOSIS — D509 Iron deficiency anemia, unspecified: Secondary | ICD-10-CM | POA: Diagnosis not present

## 2023-12-07 DIAGNOSIS — E6609 Other obesity due to excess calories: Secondary | ICD-10-CM | POA: Diagnosis not present

## 2023-12-07 DIAGNOSIS — E66811 Obesity, class 1: Secondary | ICD-10-CM | POA: Diagnosis not present

## 2023-12-07 DIAGNOSIS — Z6833 Body mass index (BMI) 33.0-33.9, adult: Secondary | ICD-10-CM

## 2023-12-07 MED ORDER — TIRZEPATIDE-WEIGHT MANAGEMENT 5 MG/0.5ML ~~LOC~~ SOLN: 5.0000 mg | SUBCUTANEOUS | 1 refills | Status: DC

## 2023-12-07 MED ORDER — LISDEXAMFETAMINE DIMESYLATE 60 MG PO CAPS: 60.0000 mg | ORAL_CAPSULE | ORAL | 0 refills | Status: DC

## 2023-12-07 MED ORDER — LISINOPRIL 10 MG PO TABS: 10.0000 mg | ORAL_TABLET | Freq: Every day | ORAL | 1 refills | Status: DC

## 2023-12-07 MED ORDER — FAMOTIDINE 20 MG PO TABS: 20.0000 mg | ORAL_TABLET | Freq: Two times a day (BID) | ORAL | 1 refills | Status: DC

## 2023-12-07 NOTE — Assessment & Plan Note (Signed)
Under good control on current regimen. Continue current regimen. Continue to monitor. Call with any concerns. Refills given for 3 months.

## 2023-12-07 NOTE — Progress Notes (Signed)
 BP 114/73   Pulse 68   Ht 5' 9 (1.753 m)   Wt 208 lb 6.4 oz (94.5 kg)   SpO2 99%   BMI 30.78 kg/m    Subjective:    Patient ID: Mckenzie [place], female    DOB: 10/27/1951, 73 y.o.   MRN: [id number]  HPI: Mckenzie [place] is a 73 y.o. female  Chief Complaint  Patient presents with   Weight Check    Patient says she is doing well and has been on the current medication for three weeks now.    OBESITY- tolerating her zepbound  well. No concerns.  Duration: chronic Previous attempts at weight loss: yes Complications of obesity: lisinopril , CAD, GERD Peak weight: 212 Weight loss goal: to be healthy Weight loss to date: 4lbs Requesting obesity pharmacotherapy: yes Current weight loss supplements/medications: yes Previous weight loss supplements/meds: no  HYPERTENSION  Hypertension status: controlled  Satisfied with current treatment? yes Duration of hypertension: chronic BP monitoring frequency:  not checking BP medication side effects:  no Medication compliance: excellent compliance Previous BP meds: lisinopril  Aspirin: no Recurrent headaches: no Visual changes: no Palpitations: no Dyspnea: no Chest pain: no Lower extremity edema: no Dizzy/lightheaded: no  Continues to have issues with her binge eating- medicine is helping to shut that noise out of her mind. No side effects. No other concerns.   Relevant past medical, surgical, family and social history reviewed and updated as indicated. Interim medical history since our last visit reviewed. Allergies and medications reviewed and updated.  Review of Systems  Constitutional: Negative.   Respiratory: Negative.    Cardiovascular: Negative.   Gastrointestinal: Negative.   Musculoskeletal: Negative.   Psychiatric/Behavioral: Negative.      Per HPI unless specifically indicated above     Objective:    BP 114/73   Pulse 68   Ht 5' 9 (1.753 m)   Wt 208 lb 6.4 oz (94.5 kg)   SpO2 99%   BMI 30.78 kg/m   Wt  Readings from Last 3 Encounters:  12/07/23 208 lb 6.4 oz (94.5 kg)  09/19/23 212 lb 9.6 oz (96.4 kg)  06/19/23 218 lb (98.9 kg)    Physical Exam Vitals and nursing note reviewed.  Constitutional:      General: She is not in acute distress.    Appearance: Normal appearance. She is obese. She is not ill-appearing, toxic-appearing or diaphoretic.  HENT:     Head: Normocephalic and atraumatic.     Right Ear: External ear normal.     Left Ear: External ear normal.     Nose: Nose normal.     Mouth/Throat:     Mouth: Mucous membranes are moist.     Pharynx: Oropharynx is clear.  Eyes:     General: No scleral icterus.       Right eye: No discharge.        Left eye: No discharge.     Extraocular Movements: Extraocular movements intact.     Conjunctiva/sclera: Conjunctivae normal.     Pupils: Pupils are equal, round, and reactive to light.  Cardiovascular:     Rate and Rhythm: Normal rate and regular rhythm.     Pulses: Normal pulses.     Heart sounds: Normal heart sounds. No murmur heard.    No friction rub. No gallop.  Pulmonary:     Effort: Pulmonary effort is normal. No respiratory distress.     Breath sounds: Normal breath sounds. No stridor. No wheezing, rhonchi or rales.  Chest:     Chest wall: No tenderness.  Musculoskeletal:        General: Normal range of motion.     Cervical back: Normal range of motion and neck supple.  Skin:    General: Skin is warm and dry.     Capillary Refill: Capillary refill takes less than 2 seconds.     Coloration: Skin is not jaundiced or pale.     Findings: No bruising, erythema, lesion or rash.  Neurological:     General: No focal deficit present.     Mental Status: She is alert and oriented to person, place, and time. Mental status is at baseline.  Psychiatric:        Mood and Affect: Mood normal.        Behavior: Behavior normal.        Thought Content: Thought content normal.        Judgment: Judgment normal.     Results for  orders placed or performed in visit on 06/19/23  Microalbumin, Urine Waived   Collection Time: 06/19/23  9:07 AM  Result Value Ref Range   Microalb, Ur Waived 80 (H) 0 - 19 mg/L   Creatinine, Urine Waived 200 10 - 300 mg/dL   Microalb/Creat Ratio <30 <30 mg/g  CBC with Differential/Platelet   Collection Time: 06/19/23  9:08 AM  Result Value Ref Range   WBC 3.8 3.4 - 10.8 x10E3/uL   RBC 4.56 3.77 - 5.28 x10E6/uL   Hemoglobin 13.6 11.1 - 15.9 g/dL   Hematocrit 58.4 65.9 - 46.6 %   MCV 91 79 - 97 fL   MCH 29.8 26.6 - 33.0 pg   MCHC 32.8 31.5 - 35.7 g/dL   RDW 86.8 88.2 - 84.5 %   Platelets 215 150 - 450 x10E3/uL   Neutrophils 59 Not Estab. %   Lymphs 23 Not Estab. %   Monocytes 10 Not Estab. %   Eos 7 Not Estab. %   Basos 1 Not Estab. %   Neutrophils Absolute 2.2 1.4 - 7.0 x10E3/uL   Lymphocytes Absolute 0.9 0.7 - 3.1 x10E3/uL   Monocytes Absolute 0.4 0.1 - 0.9 x10E3/uL   EOS (ABSOLUTE) 0.3 0.0 - 0.4 x10E3/uL   Basophils Absolute 0.0 0.0 - 0.2 x10E3/uL   Immature Granulocytes 0 Not Estab. %   Immature Grans (Abs) 0.0 0.0 - 0.1 x10E3/uL  Comprehensive metabolic panel   Collection Time: 06/19/23  9:08 AM  Result Value Ref Range   Glucose 74 70 - 99 mg/dL   BUN 27 8 - 27 mg/dL   Creatinine, Ser 8.99 0.57 - 1.00 mg/dL   eGFR 60 >40 fO/fpw/8.26   BUN/Creatinine Ratio 27 12 - 28   Sodium 143 134 - 144 mmol/L   Potassium 4.2 3.5 - 5.2 mmol/L   Chloride 107 (H) 96 - 106 mmol/L   CO2 23 20 - 29 mmol/L   Calcium 9.6 8.7 - 10.3 mg/dL   Total Protein 6.3 6.0 - 8.5 g/dL   Albumin 4.2 3.8 - 4.8 g/dL   Globulin, Total 2.1 1.5 - 4.5 g/dL   Bilirubin Total 0.4 0.0 - 1.2 mg/dL   Alkaline Phosphatase 100 44 - 121 IU/L   AST 21 0 - 40 IU/L   ALT 11 0 - 32 IU/L  Lipid Panel w/o Chol/HDL Ratio   Collection Time: 06/19/23  9:08 AM  Result Value Ref Range   Cholesterol, Total 168 100 - 199 mg/dL   Triglycerides 79 0 - 149 mg/dL  HDL 70 >39 mg/dL   VLDL Cholesterol Cal 15 5 - 40 mg/dL    LDL Chol Calc (NIH) 83 0 - 99 mg/dL  TSH   Collection Time: 06/19/23  9:08 AM  Result Value Ref Range   TSH 4.910 (H) 0.450 - 4.500 uIU/mL  Ferritin   Collection Time: 06/19/23  9:08 AM  Result Value Ref Range   Ferritin 61 15 - 150 ng/mL  Iron Binding Cap (TIBC)([facility])   Collection Time: 06/19/23  9:08 AM  Result Value Ref Range   Total Iron Binding Capacity 284 250 - 450 ug/dL   UIBC 770 881 - 630 ug/dL   Iron 55 27 - 860 ug/dL   Iron Saturation 19 15 - 55 %      Assessment & Plan:   Problem List Items Addressed This Visit       Genitourinary   Benign hypertensive renal disease - Primary (Chronic)   Under good control on current regimen. Continue current regimen. Continue to monitor. Call with any concerns. Refills given. Labs drawn today.        Relevant Orders   Basic metabolic panel     Other   Iron deficiency anemia   Relevant Orders   CBC with Differential/Platelet   Binge eating disorder   Under good control on current regimen. Continue current regimen. Continue to monitor. Call with any concerns. Refills given for 3 months.       Class 1 obesity due to excess calories with serious comorbidity and body mass index (BMI) of 33.0 to 33.9 in adult   Tolerating her zepbound  well. Will increase her to 5mg  weekly. Recheck in about 6 weeks. Call with any concerns.       Relevant Medications   lisdexamfetamine (VYVANSE ) 60 MG capsule (Start on 01/17/2024)   lisdexamfetamine (VYVANSE ) 60 MG capsule (Start on 02/16/2024)   lisdexamfetamine (VYVANSE ) 60 MG capsule (Start on 12/18/2023)   tirzepatide  5 MG/0.5ML injection vial     Follow up plan: Return in about 6 weeks (around 01/18/2024).

## 2023-12-07 NOTE — Assessment & Plan Note (Signed)
 Tolerating her zepbound well. Will increase her to 5mg  weekly. Recheck in about 6 weeks. Call with any concerns.

## 2023-12-07 NOTE — Telephone Encounter (Signed)
 [facility] is calling in because they received a prescription for the tirzepatide 5 MG/0.5ML injection vial [[id number]] and they don't fill those. They said they can fill the pen but not the vials.

## 2023-12-07 NOTE — Assessment & Plan Note (Signed)
 Under good control on current regimen. Continue current regimen. Continue to monitor. Call with any concerns. Refills given. Labs drawn today.

## 2023-12-07 NOTE — Telephone Encounter (Signed)
 [facility] sent to [facility]. Already sent to [facility]

## 2023-12-07 NOTE — Telephone Encounter (Signed)
 Routing to provider. Can this be changed to the pen?

## 2023-12-08 LAB — CBC WITH DIFFERENTIAL/PLATELET
Basophils Absolute: 0 10*3/uL (ref 0.0–0.2)
Basos: 1 %
EOS (ABSOLUTE): 0.2 10*3/uL (ref 0.0–0.4)
Eos: 6 %
Hematocrit: 45.1 % (ref 34.0–46.6)
Hemoglobin: 14.7 g/dL (ref 11.1–15.9)
Immature Grans (Abs): 0 10*3/uL (ref 0.0–0.1)
Immature Granulocytes: 0 %
Lymphocytes Absolute: 1 10*3/uL (ref 0.7–3.1)
Lymphs: 26 %
MCH: 29.6 pg (ref 26.6–33.0)
MCHC: 32.6 g/dL (ref 31.5–35.7)
MCV: 91 fL (ref 79–97)
Monocytes Absolute: 0.4 10*3/uL (ref 0.1–0.9)
Monocytes: 10 %
Neutrophils Absolute: 2.2 10*3/uL (ref 1.4–7.0)
Neutrophils: 57 %
Platelets: 200 10*3/uL (ref 150–450)
RBC: 4.97 x10E6/uL (ref 3.77–5.28)
RDW: 12.1 % (ref 11.7–15.4)
WBC: 3.8 10*3/uL (ref 3.4–10.8)

## 2023-12-08 LAB — BASIC METABOLIC PANEL
BUN/Creatinine Ratio: 27 (ref 12–28)
BUN: 26 mg/dL (ref 8–27)
CO2: 23 mmol/L (ref 20–29)
Calcium: 9.4 mg/dL (ref 8.7–10.3)
Chloride: 108 mmol/L — ABNORMAL HIGH (ref 96–106)
Creatinine, Ser: 0.96 mg/dL (ref 0.57–1.00)
Glucose: 82 mg/dL (ref 70–99)
Potassium: 4.6 mmol/L (ref 3.5–5.2)
Sodium: 144 mmol/L (ref 134–144)
eGFR: 63 mL/min/{1.73_m2} (ref >59)

## 2023-12-09 ENCOUNTER — Encounter: Payer: Self-pay | Admitting: Family Medicine

## 2023-12-11 ENCOUNTER — Ambulatory Visit: Payer: PPO | Admitting: Family Medicine

## 2023-12-20 ENCOUNTER — Ambulatory Visit: Payer: Self-pay | Admitting: Family Medicine

## 2024-01-10 ENCOUNTER — Ambulatory Visit (INDEPENDENT_AMBULATORY_CARE_PROVIDER_SITE_OTHER): Payer: PPO | Admitting: Emergency Medicine

## 2024-01-10 VITALS — Ht 69.0 in | Wt 190.0 lb

## 2024-01-10 DIAGNOSIS — Z0000 Encounter for general adult medical examination without abnormal findings: Secondary | ICD-10-CM

## 2024-01-10 NOTE — Patient Instructions (Addendum)
 Mckenzie Webster , Thank you for taking time to come for your Medicare Wellness Visit. I appreciate your ongoing commitment to your health goals. Please review the following plan we discussed and let me know if I can assist you in the future.   Referrals/Orders/Follow-Ups/Clinician Recommendations: Keep up the good work!!  This is a list of the screening recommended for you and due dates:  Health Maintenance  Topic Date Due   COVID-19 Vaccine (3 - Pfizer risk series) 02/10/2020   Colon Cancer Screening  05/09/2024   Mammogram  11/13/2024   Medicare Annual Wellness Visit  01/09/2025   DEXA scan (bone density measurement)  08/28/2025   DTaP/Tdap/Td vaccine (2 - Td or Tdap) 02/15/2026   Pneumonia Vaccine  Completed   Flu Shot  Completed   Hepatitis C Screening  Completed   Zoster (Shingles) Vaccine  Completed   HPV Vaccine  Aged Out    Advanced directives: (Copy Requested) Please bring a copy of your health care power of attorney and living will to the office to be added to your chart at your convenience. You can mail to [facility] [address] [place], [place] [address] or email to [email or web address]  Next Medicare Annual Wellness Visit scheduled for next year: Yes, 01/22/25 @ 2:30pm (phone visit)  Fall Prevention in the Home, Adult Falls can cause injuries and affect people of all ages. There are many simple things that you can do to make your home safe and to help prevent falls. If you need it, ask for help making these changes. What actions can I take to prevent falls? General information Use good lighting in all rooms. Make sure to: Replace any light bulbs that burn out. Turn on lights if it is dark and use night-lights. Keep items that you use often in easy-to-reach places. Lower the shelves around your home if needed. Move furniture so that there are clear paths around it. Do not keep throw rugs or other things on the floor that can make you trip. If any  of your floors are uneven, fix them. Add color or contrast paint or tape to clearly mark and help you see: Grab bars or handrails. First and last steps of staircases. Where the edge of each step is. If you use a ladder or stepladder: Make sure that it is fully opened. Do not climb a closed ladder. Make sure the sides of the ladder are locked in place. Have someone hold the ladder while you use it. Know where your pets are as you move through your home. What can I do in the bathroom?     Keep the floor dry. Clean up any water that is on the floor right away. Remove soap buildup in the bathtub or shower. Buildup makes bathtubs and showers slippery. Use non-skid mats or decals on the floor of the bathtub or shower. Attach bath mats securely with double-sided, non-slip rug tape. If you need to sit down while you are in the shower, use a non-slip stool. Install grab bars by the toilet and in the bathtub and shower. Do not use towel bars as grab bars. What can I do in the bedroom? Make sure that you have a light by your bed that is easy to reach. Do not use any sheets or blankets on your bed that hang to the floor. Have a firm bench or chair with side arms that you can use for support when you get dressed. What can I do in  the kitchen? Clean up any spills right away. If you need to reach something above you, use a sturdy step stool that has a grab bar. Keep electrical cables out of the way. Do not use floor polish or wax that makes floors slippery. What can I do with my stairs? Do not leave anything on the stairs. Make sure that you have a light switch at the top and the bottom of the stairs. Have them installed if you do not have them. Make sure that there are handrails on both sides of the stairs. Fix handrails that are broken or loose. Make sure that handrails are as long as the staircases. Install non-slip stair treads on all stairs in your home if they do not have carpet. Avoid  having throw rugs at the top or bottom of stairs, or secure the rugs with carpet tape to prevent them from moving. Choose a carpet design that does not hide the edge of steps on the stairs. Make sure that carpet is firmly attached to the stairs. Fix any carpet that is loose or worn. What can I do on the outside of my home? Use bright outdoor lighting. Repair the edges of walkways and driveways and fix any cracks. Clear paths of anything that can make you trip, such as tools or rocks. Add color or contrast paint or tape to clearly mark and help you see high doorway thresholds. Trim any bushes or trees on the main path into your home. Check that handrails are securely fastened and in good repair. Both sides of all steps should have handrails. Install guardrails along the edges of any raised decks or porches. Have leaves, snow, and ice cleared regularly. Use sand, salt, or ice melt on walkways during winter months if you live where there is ice and snow. In the garage, clean up any spills right away, including grease or oil spills. What other actions can I take? Review your medicines with your health care provider. Some medicines can make you confused or feel dizzy. This can increase your chance of falling. Wear closed-toe shoes that fit well and support your feet. Wear shoes that have rubber soles and low heels. Use a cane, walker, scooter, or crutches that help you move around if needed. Talk with your provider about other ways that you can decrease your risk of falls. This may include seeing a physical therapist to learn to do exercises to improve movement and strength. Where to find more information Centers for Disease Control and Prevention, STEADI: TonerPromos.no [employer] on Aging: BaseRingTones.pl National Institute on Aging: BaseRingTones.pl Contact a health care provider if: You are afraid of falling at home. You feel weak, drowsy, or dizzy at home. You fall at home. Get help right away if  you: Lose consciousness or have trouble moving after a fall. Have a fall that causes a head injury. These symptoms may be an emergency. Get help right away. Call 911. Do not wait to see if the symptoms will go away. Do not drive yourself to the hospital. This information is not intended to replace advice given to you by your health care provider. Make sure you discuss any questions you have with your health care provider. Document Revised: 06/19/2022 Document Reviewed: 06/19/2022 Elsevier Patient Education  2024 [employer].

## 2024-01-10 NOTE — Progress Notes (Signed)
 Subjective:   Mckenzie Webster is a 73 y.o. who presents for a Medicare Wellness preventive visit.  Visit Complete: Virtual I connected with  [name] on 01/10/24 by a audio enabled telemedicine application and verified that I am speaking with the correct person using two identifiers.  Patient Location: Home  Provider Location: Home Office  I discussed the limitations of evaluation and management by telemedicine. The patient expressed understanding and agreed to proceed.  Vital Signs: Because this visit was a virtual/telehealth visit, some criteria may be missing or patient reported. Any vitals not documented were not able to be obtained and vitals that have been documented are patient reported.  VideoDeclined- This patient declined Librarian, academic. Therefore the visit was completed with audio only.  Persons Participating in Visit: Patient.  AWV Questionnaire: Yes: Patient Medicare AWV questionnaire was completed by the patient on 01/09/24; I have confirmed that all information answered by patient is correct and no changes since this date.  Cardiac Risk Factors include: advanced age (>80men, >51 women);Other (see comment), Risk factor comments: CAD     Objective:    Today's Vitals   01/10/24 1432  Weight: 190 lb (86.2 kg)  Height: 5\' 9"  (1.753 m)   Body mass index is 28.06 kg/m.     01/10/2024    2:44 PM 01/03/2023   11:12 AM 01/03/2023    8:30 AM 01/02/2023    2:32 PM 12/26/2022    8:19 AM 12/21/2021    8:32 AM 05/09/2021    6:48 AM  Advanced Directives  Does Patient Have a Medical Advance Directive? Yes No No No No No No  Type of Estate agent of [place];Living will        Does patient want to make changes to medical advance directive? No - Patient declined        Copy of Healthcare Power of Attorney in Chart? No - copy requested        Would patient like information on creating a medical advance directive?  No -  Patient declined   No - Patient declined No - Patient declined     Current Medications (verified) Outpatient Encounter Medications as of 01/10/2024  Medication Sig   albuterol (VENTOLIN HFA) 108 (90 Base) MCG/ACT inhaler Inhale 2 puffs into the lungs every 6 (six) hours as needed for wheezing or shortness of breath.   cetirizine (ZYRTEC) 10 MG tablet Take 10 mg by mouth daily.   famotidine (PEPCID) 20 MG tablet Take 1 tablet (20 mg total) by mouth 2 (two) times daily.   ferrous sulfate 325 (65 FE) MG EC tablet Take 1 tablet (325 mg total) by mouth daily.   fluticasone (FLONASE) 50 MCG/ACT nasal spray Place 2 sprays into both nostrils daily.    hydroxychloroquine (PLAQUENIL) 200 MG tablet Take 200 mg by mouth 2 (two) times daily.   [START ON 01/17/2024] lisdexamfetamine (VYVANSE) 60 MG capsule Take 1 capsule (60 mg total) by mouth every morning.   lisinopril (ZESTRIL) 10 MG tablet Take 1 tablet (10 mg total) by mouth daily.   naproxen (NAPROSYN) 500 MG tablet Take 1 tablet (500 mg total) by mouth 2 (two) times daily with a meal. For next few days and then as needed   omeprazole (PRILOSEC) 20 MG capsule Take 20 mg by mouth daily.   tirzepatide 5 MG/0.5ML injection vial Inject 5 mg into the skin once a week. Dx:E66.811   lisdexamfetamine (VYVANSE) 60 MG capsule Take 1  capsule (60 mg total) by mouth every morning.   [START ON 02/16/2024] lisdexamfetamine (VYVANSE) 60 MG capsule Take 1 capsule (60 mg total) by mouth every morning. (Patient not taking: Reported on 01/10/2024)   lisdexamfetamine (VYVANSE) 60 MG capsule Take 1 capsule (60 mg total) by mouth every morning. (Patient not taking: Reported on 01/10/2024)   tirzepatide (ZEPBOUND) 2.5 MG/0.5ML injection vial Inject 2.5 mg into the skin once a week. Dx:E66.811 (Patient not taking: Reported on 01/10/2024)   No facility-administered encounter medications on file as of 01/10/2024.    Allergies (verified) Patient has no known allergies.    History: Past Medical History:  Diagnosis Date   Anemia    Cataract    Dry eye syndrome    Positive colorectal cancer screening using Cologuard test 02/14/2017   Stomach ulcer    Past Surgical History:  Procedure Laterality Date   COLONOSCOPY WITH PROPOFOL N/A 04/27/2017   Procedure: COLONOSCOPY WITH PROPOFOL;  Surgeon: Wyline Mood, MD;  Location[facility] END[facility]COPY;  Service: Endoscopy;  Laterality: N/A;   COLONOSCOPY WITH PROPOFOL N/A 05/09/2021   Procedure: COLONOSCOPY WITH PROPOFOL;  Surgeon: Wyline Mood, MD;  Location: [facility] ENDOSCOPY;  Service: Gastroenterology;  Laterality: N/A;   EYE SURGERY     Cataract   FOOT SURGERY     Change the alignment of the toes to stop the formation of corns   gastric bypass  1981   Family History  Problem Relation Age of Onset   Heart disease Mother    COPD Mother    Heart disease Father    Hypertension Father    Heart disease Maternal Grandfather    Alzheimer's disease Paternal Grandmother    Breast cancer Neg Hx    Social History   Socioeconomic History   Marital status: Divorced    Spouse name: Not on file   Number of children: 1   Years of education: Not on file   Highest education level: Some college, no degree  Occupational History   Occupation: Occupational psychologist part time    Occupation: retired  Tobacco Use   Smoking status: Former    Current packs/day: 0.00    Average packs/day: 2.5 packs/day for 20.0 years (50.0 ttl pk-yrs)    Types: Cigarettes    Start date: 22    Quit date: 10/30/1989    Years since quitting: 34.2   Smokeless tobacco: Never  Vaping Use   Vaping status: Never Used  Substance and Sexual Activity   Alcohol use: Not Currently    Comment: rare   Drug use: No   Sexual activity: Not Currently  Other Topics Concern   Not on file  Social History Narrative   Not on file   Social Drivers of Health   Financial Resource Strain: Low Risk  (01/10/2024)   Overall Financial Resource Strain (CARDIA)    Difficulty of  Paying Living Expenses: Not hard at all  Food Insecurity: No Food Insecurity (01/10/2024)   Hunger Vital Sign    Worried About Running Out of Food in the Last Year: Never true    Ran Out of Food in the Last Year: Never true  Transportation Needs: No Transportation Needs (01/10/2024)   PRAPARE - Administrator, Civil Service (Medical): No    Lack of Transportation (Non-Medical): No  Physical Activity: Inactive (01/10/2024)   Exercise Vital Sign    Days of Exercise per Week: 0 days    Minutes of Exercise per Session: 0 min  Stress: No Stress Concern  Present (01/10/2024)   [employer] of Occupational Health - Occupational Stress Questionnaire    Feeling of Stress : Not at all  Social Connections: Moderately Isolated (01/10/2024)   Social Connection and Isolation Panel [NHANES]    Frequency of Communication with Friends and Family: More than three times a week    Frequency of Social Gatherings with Friends and Family: More than three times a week    Attends Religious Services: More than 4 times per year    Active Member of [employer] or Organizations: No    Attends Engineer, structural: Never    Marital Status: Divorced    Tobacco Counseling Counseling given: Not Answered    Clinical Intake:  Pre-visit preparation completed: Yes  Pain : No/denies pain     BMI - recorded: 28.06 Nutritional Status: BMI 25 -29 Overweight Nutritional Risks: None Diabetes: No  How often do you need to have someone help you when you read instructions, pamphlets, or other written materials from your doctor or pharmacy?: 1 - Never  Interpreter Needed?: No  Information entered by :: Tora Kindred, CMA   Activities of Daily Living     01/10/2024    2:33 PM 01/09/2024    7:09 PM  In your present state of health, do you have any difficulty performing the following activities:  Hearing? 0 0  Vision? 0 0  Difficulty concentrating or making decisions? 0 0  Walking or climbing  stairs? 0 0  Dressing or bathing? 0 0  Doing errands, shopping? 0 0  Preparing Food and eating ? N N  Using the Toilet? N N  In the past six months, have you accidently leaked urine? N N  Do you have problems with loss of bowel control? N N  Managing your Medications? N N  Managing your Finances? N N  Housekeeping or managing your Housekeeping? N N    Patient Care Team: Dorcas Carrow, DO as PCP - General (Family Medicine) Buck Mam, DDS (Dentistry)  Indicate any recent Medical Services you may have received from other than [facility] providers in the past year (date may be approximate).     Assessment:   This is a routine wellness examination for Alianza.  Hearing/Vision screen Hearing Screening - Comments:: mild hearing loss Vision Screening - Comments:: Gets routine eye exams, [facility] [place]   Goals Addressed             This Visit's Progress    Weight (lb) < 170 lb (77.1 kg)   190 lb (86.2 kg)      Depression Screen     01/10/2024    2:42 PM 12/07/2023    9:29 AM 09/19/2023    8:12 AM 06/19/2023    9:07 AM 03/19/2023    8:50 AM 01/08/2023   10:32 AM 12/26/2022    8:17 AM  PHQ 2/9 Scores  PHQ - 2 Score 0 0 0 0 0 0 0  PHQ- 9 Score 0 0 0 0 0 0 0    Fall Risk     01/10/2024    2:45 PM 01/09/2024    7:09 PM 12/07/2023    9:28 AM 06/19/2023    9:06 AM 03/19/2023    8:49 AM  Fall Risk   Falls in the past year? 1 1 1  0 0  Number falls in past yr: 0 0 0 0 0  Injury with Fall? 1 0 0 0 0  Risk for fall due to : History  of fall(s);Impaired balance/gait;Orthopedic patient;Other (Comment)  History of fall(s) No Fall Risks No Fall Risks  Risk for fall due to: Comment neuropathy in feet per patient      Follow up Falls prevention discussed;Falls evaluation completed;Education provided  Falls evaluation completed Falls evaluation completed Falls evaluation completed    MEDICARE RISK AT HOME:  Medicare Risk at Home Any stairs in or around the home?: Yes If so, are  there any without handrails?: No Home free of loose throw rugs in walkways, pet beds, electrical cords, etc?: Yes Adequate lighting in your home to reduce risk of falls?: Yes Life alert?: No Use of a cane, walker or w/c?: No Grab bars in the bathroom?: Yes Shower chair or bench in shower?: Yes Elevated toilet seat or a handicapped toilet?: No  TIMED UP AND GO:  Was the test performed?  No  Cognitive Function: 6CIT completed        01/10/2024    2:46 PM 12/26/2022    8:29 AM 12/26/2022    8:24 AM 12/04/2018    8:33 AM 11/28/2017    8:47 AM  6CIT Screen  What Year? 0 points 0 points 0 points 0 points 0 points  What month? 0 points 0 points 0 points 0 points 0 points  What time? 0 points  0 points 0 points 0 points  Count back from 20 0 points  0 points 0 points 0 points  Months in reverse 0 points  0 points 0 points 0 points  Repeat phrase 0 points  0 points 0 points 0 points  Total Score 0 points  0 points 0 points 0 points    Immunizations Immunization History  Administered Date(s) Administered   Fluad Quad(high Dose 65+) 08/15/2019, 09/14/2020, 09/12/2021, 09/08/2022   Fluad Trivalent(High Dose 65+) 09/19/2023   Influenza, High Dose Seasonal PF 08/17/2017, 07/31/2018   Influenza,inj,Quad PF,6+ Mos 11/24/2016   Influenza-Unspecified 08/17/2017, 07/31/2018   PFIZER(Purple Top)SARS-COV-2 Vaccination 12/15/2019, 01/13/2020   Pneumococcal Conjugate-13 11/24/2016   Pneumococcal Polysaccharide-23 11/28/2017   Tdap 02/16/2016   Zoster Recombinant(Shingrix) 02/14/2017, 08/17/2017   Zoster, Live 02/14/2017    Screening Tests Health Maintenance  Topic Date Due   COVID-19 Vaccine (3 - Pfizer risk series) 02/10/2020   Colonoscopy  05/09/2024   MAMMOGRAM  11/13/2024   Medicare Annual Wellness (AWV)  01/09/2025   DEXA SCAN  08/28/2025   DTaP/Tdap/Td (2 - Td or Tdap) 02/15/2026   Pneumonia Vaccine 77+ Years old  Completed   INFLUENZA VACCINE  Completed   Hepatitis C Screening   Completed   Zoster Vaccines- Shingrix  Completed   HPV VACCINES  Aged Out    Health Maintenance  Health Maintenance Due  Topic Date Due   COVID-19 Vaccine (3 - Pfizer risk series) 02/10/2020   Colonoscopy  05/09/2024   Health Maintenance Items Addressed: See Nurse Notes  Additional Screening:  Vision Screening: Recommended annual ophthalmology exams for early detection of glaucoma and other disorders of the eye.  Dental Screening: Recommended annual dental exams for proper oral hygiene  Community Resource Referral / Chronic Care Management: CRR required this visit?  No   CCM required this visit?  No     Plan:     I have personally reviewed and noted the following in the patient's chart:   Medical and social history Use of alcohol, tobacco or illicit drugs  Current medications and supplements including opioid prescriptions. Patient is not currently taking opioid prescriptions. Functional ability and status Nutritional status Physical activity  Advanced directives List of other physicians Hospitalizations, surgeries, and ER visits in previous 12 months Vitals Screenings to include cognitive, depression, and falls Referrals and appointments  In addition, I have reviewed and discussed with patient certain preventive protocols, quality metrics, and best practice recommendations. A written personalized care plan for preventive services as well as general preventive health recommendations were provided to patient.     Tora Kindred, CMA   01/10/2024   After Visit Summary: (MyChart) Due to this being a telephonic visit, the after visit summary with patients personalized plan was offered to patient via MyChart   Notes:  Declined Covid Last colon 05/09/21, no repeat per GI note

## 2024-01-18 ENCOUNTER — Ambulatory Visit (INDEPENDENT_AMBULATORY_CARE_PROVIDER_SITE_OTHER): Payer: PPO | Admitting: Family Medicine

## 2024-01-18 ENCOUNTER — Encounter: Payer: Self-pay | Admitting: Family Medicine

## 2024-01-18 VITALS — BP 119/75 | Temp 98.7°F | Resp 16 | Ht 69.02 in | Wt 193.4 lb

## 2024-01-18 DIAGNOSIS — E663 Overweight: Secondary | ICD-10-CM | POA: Diagnosis not present

## 2024-01-18 MED ORDER — TIRZEPATIDE-WEIGHT MANAGEMENT 7.5 MG/0.5ML ~~LOC~~ SOLN: 7.5000 mg | SUBCUTANEOUS | 1 refills | Status: DC

## 2024-01-18 MED ORDER — TIRZEPATIDE-WEIGHT MANAGEMENT 10 MG/0.5ML ~~LOC~~ SOLN: 10.0000 mg | SUBCUTANEOUS | 1 refills | Status: DC

## 2024-01-18 NOTE — Assessment & Plan Note (Signed)
 Congratulated patient on 19lb weight loss! Tolerating medicine well. Continue to monitor. Will continue titration to 7.5mg  and then to 10mg . Call with any concerns. Recheck 2 months.

## 2024-01-18 NOTE — Progress Notes (Signed)
 BP 119/75 (BP Location: Left Arm, Patient Position: Sitting, Cuff Size: Normal)   Temp 98.7 F (37.1 C) (Oral)   Resp 16   Ht 5' 9.02" (1.753 m)   Wt 193 lb 6.4 oz (87.7 kg)   SpO2 98%   BMI 28.55 kg/m    Subjective:    Patient ID: Mckenzie Webster, female    DOB: 03-May-1951, 73 y.o.   MRN: [id number]  HPI: Mckenzie Webster is a 73 y.o. female  Chief Complaint  Patient presents with   Obesity    Going very well on the Zepbound. No concerns.    OBESITY Duration: chronic Previous attempts at weight loss: yes Complications of obesity: HTN, CAD, GERD Peak weight: 212 Weight loss goal: to be healthy (170) Weight loss to date: 19lbs Requesting obesity pharmacotherapy: yes Current weight loss supplements/medications: yes Previous weight loss supplements/meds: no  Relevant past medical, surgical, family and social history reviewed and updated as indicated. Interim medical history since our last visit reviewed. Allergies and medications reviewed and updated.  Review of Systems  Constitutional: Negative.   Respiratory: Negative.    Cardiovascular: Negative.   Musculoskeletal: Negative.   Psychiatric/Behavioral: Negative.      Per HPI unless specifically indicated above     Objective:    BP 119/75 (BP Location: Left Arm, Patient Position: Sitting, Cuff Size: Normal)   Temp 98.7 F (37.1 C) (Oral)   Resp 16   Ht 5' 9.02" (1.753 m)   Wt 193 lb 6.4 oz (87.7 kg)   SpO2 98%   BMI 28.55 kg/m   Wt Readings from Last 3 Encounters:  01/18/24 193 lb 6.4 oz (87.7 kg)  01/10/24 190 lb (86.2 kg)  12/07/23 208 lb 6.4 oz (94.5 kg)    Physical Exam Vitals and nursing note reviewed.  Constitutional:      General: She is not in acute distress.    Appearance: Normal appearance. She is obese. She is not ill-appearing, toxic-appearing or diaphoretic.  HENT:     Head: Normocephalic and atraumatic.     Right Ear: External ear normal.     Left Ear: External ear normal.     Nose:  Nose normal.     Mouth/Throat:     Mouth: Mucous membranes are moist.     Pharynx: Oropharynx is clear.  Eyes:     General: No scleral icterus.       Right eye: No discharge.        Left eye: No discharge.     Extraocular Movements: Extraocular movements intact.     Conjunctiva/sclera: Conjunctivae normal.     Pupils: Pupils are equal, round, and reactive to light.  Cardiovascular:     Rate and Rhythm: Normal rate and regular rhythm.     Pulses: Normal pulses.     Heart sounds: Normal heart sounds. No murmur heard.    No friction rub. No gallop.  Pulmonary:     Effort: Pulmonary effort is normal. No respiratory distress.     Breath sounds: Normal breath sounds. No stridor. No wheezing, rhonchi or rales.  Chest:     Chest wall: No tenderness.  Musculoskeletal:        General: Normal range of motion.     Cervical back: Normal range of motion and neck supple.  Skin:    General: Skin is warm and dry.     Capillary Refill: Capillary refill takes less than 2 seconds.     Coloration: Skin is not  jaundiced or pale.     Findings: No bruising, erythema, lesion or rash.  Neurological:     General: No focal deficit present.     Mental Status: She is alert and oriented to person, place, and time. Mental status is at baseline.  Psychiatric:        Mood and Affect: Mood normal.        Behavior: Behavior normal.        Thought Content: Thought content normal.        Judgment: Judgment normal.     Results for orders placed or performed in visit on 12/07/23  CBC with Differential/Platelet   Collection Time: 12/07/23 10:03 AM  Result Value Ref Range   WBC 3.8 3.4 - 10.8 x10E3/uL   RBC 4.97 3.77 - 5.28 x10E6/uL   Hemoglobin 14.7 11.1 - 15.9 g/dL   Hematocrit 16.1 09.6 - 46.6 %   MCV 91 79 - 97 fL   MCH 29.6 26.6 - 33.0 pg   MCHC 32.6 31.5 - 35.7 g/dL   RDW 04.5 40.9 - 81.1 %   Platelets 200 150 - 450 x10E3/uL   Neutrophils 57 Not Estab. %   Lymphs 26 Not Estab. %   Monocytes 10 Not  Estab. %   Eos 6 Not Estab. %   Basos 1 Not Estab. %   Neutrophils Absolute 2.2 1.4 - 7.0 x10E3/uL   Lymphocytes Absolute 1.0 0.7 - 3.1 x10E3/uL   Monocytes Absolute 0.4 0.1 - 0.9 x10E3/uL   EOS (ABSOLUTE) 0.2 0.0 - 0.4 x10E3/uL   Basophils Absolute 0.0 0.0 - 0.2 x10E3/uL   Immature Granulocytes 0 Not Estab. %   Immature Grans (Abs) 0.0 0.0 - 0.1 x10E3/uL  Basic metabolic panel   Collection Time: 12/07/23 10:03 AM  Result Value Ref Range   Glucose 82 70 - 99 mg/dL   BUN 26 8 - 27 mg/dL   Creatinine, Ser 9.14 0.57 - 1.00 mg/dL   eGFR 63 >78 GN/FAO/1.30   BUN/Creatinine Ratio 27 12 - 28   Sodium 144 134 - 144 mmol/L   Potassium 4.6 3.5 - 5.2 mmol/L   Chloride 108 (H) 96 - 106 mmol/L   CO2 23 20 - 29 mmol/L   Calcium 9.4 8.7 - 10.3 mg/dL      Assessment & Plan:   Problem List Items Addressed This Visit       Other   Overweight (BMI 25.0-29.9) - Primary   Congratulated patient on 19lb weight loss! Tolerating medicine well. Continue to monitor. Will continue titration to 7.5mg  and then to 10mg . Call with any concerns. Recheck 2 months.         Follow up plan: Return in about 2 months (around 03/19/2024).

## 2024-03-21 ENCOUNTER — Encounter: Payer: Self-pay | Admitting: Family Medicine

## 2024-03-21 ENCOUNTER — Ambulatory Visit (INDEPENDENT_AMBULATORY_CARE_PROVIDER_SITE_OTHER): Admitting: Family Medicine

## 2024-03-21 VITALS — BP 96/63 | HR 69 | Temp 98.4°F | Resp 16 | Ht 69.02 in | Wt 172.4 lb

## 2024-03-21 DIAGNOSIS — F50811 Binge eating disorder, moderate: Secondary | ICD-10-CM | POA: Diagnosis not present

## 2024-03-21 DIAGNOSIS — I129 Hypertensive chronic kidney disease with stage 1 through stage 4 chronic kidney disease, or unspecified chronic kidney disease: Secondary | ICD-10-CM | POA: Diagnosis not present

## 2024-03-21 DIAGNOSIS — E663 Overweight: Secondary | ICD-10-CM | POA: Diagnosis not present

## 2024-03-21 MED ORDER — LISDEXAMFETAMINE DIMESYLATE 60 MG PO CAPS: 60.0000 mg | ORAL_CAPSULE | ORAL | 0 refills | Status: DC

## 2024-03-21 MED ORDER — TIRZEPATIDE-WEIGHT MANAGEMENT 10 MG/0.5ML ~~LOC~~ SOLN: 10.0000 mg | SUBCUTANEOUS | 5 refills | Status: DC

## 2024-03-21 NOTE — Assessment & Plan Note (Signed)
 BP running low with weight loss. Will stop lisinopril  and recheck in 3 months. Call with any concerns.

## 2024-03-21 NOTE — Progress Notes (Signed)
 BP 96/63 (BP Location: Left Arm, Patient Position: Sitting, Cuff Size: Normal)   Pulse 69   Temp 98.4 F (36.9 C) (Oral)   Resp 16   Ht 5' 9.02" (1.753 m)   Wt 172 lb 6.4 oz (78.2 kg)   SpO2 98%   BMI 25.45 kg/m    Subjective:    Patient ID: Mckenzie Webster, female    DOB: January 29, 1951, 73 y.o.   MRN: [id number]  HPI: Mckenzie Webster is a 73 y.o. female  Chief Complaint  Patient presents with   Obesity    Doing well.    OBESITY Duration: chronic Previous attempts at weight loss: yes Complications of obesity: HTN, CAD, GERD Peak weight: 212 Weight loss goal: to be healthy (170) Weight loss to date: 40lbs (19% body weight) Requesting obesity pharmacotherapy: yes Current weight loss supplements/medications: yes Previous weight loss supplements/meds: no  HYPERTENSION  Hypertension status: overtreated  Satisfied with current treatment? unsure Duration of hypertension: chronic BP monitoring frequency:  rarely BP medication side effects:  no Medication compliance: excellent compliance Previous BP meds:lisinopril  Aspirin: no Recurrent headaches: no Visual changes: no Palpitations: no Dyspnea: no Chest pain: no Lower extremity edema: no Dizzy/lightheaded: no  Vyvnase continues to help with binge eating. Has been doing well. No issues with side effects. No other concerns or complaints at this time.   Relevant past medical, surgical, family and social history reviewed and updated as indicated. Interim medical history since our last visit reviewed. Allergies and medications reviewed and updated.  Review of Systems  Constitutional:  Positive for fatigue. Negative for activity change, appetite change, chills, diaphoresis, fever and unexpected weight change.  Respiratory: Negative.    Cardiovascular: Negative.   Musculoskeletal: Negative.   Neurological: Negative.   Psychiatric/Behavioral: Negative.      Per HPI unless specifically indicated above     Objective:      BP 96/63 (BP Location: Left Arm, Patient Position: Sitting, Cuff Size: Normal)   Pulse 69   Temp 98.4 F (36.9 C) (Oral)   Resp 16   Ht 5' 9.02" (1.753 m)   Wt 172 lb 6.4 oz (78.2 kg)   SpO2 98%   BMI 25.45 kg/m   Wt Readings from Last 3 Encounters:  03/21/24 172 lb 6.4 oz (78.2 kg)  01/18/24 193 lb 6.4 oz (87.7 kg)  01/10/24 190 lb (86.2 kg)    Physical Exam Vitals and nursing note reviewed.  Constitutional:      General: She is not in acute distress.    Appearance: Normal appearance. She is not ill-appearing, toxic-appearing or diaphoretic.  HENT:     Head: Normocephalic and atraumatic.     Right Ear: External ear normal.     Left Ear: External ear normal.     Nose: Nose normal.     Mouth/Throat:     Mouth: Mucous membranes are moist.     Pharynx: Oropharynx is clear.  Eyes:     General: No scleral icterus.       Right eye: No discharge.        Left eye: No discharge.     Extraocular Movements: Extraocular movements intact.     Conjunctiva/sclera: Conjunctivae normal.     Pupils: Pupils are equal, round, and reactive to light.  Cardiovascular:     Rate and Rhythm: Normal rate and regular rhythm.     Pulses: Normal pulses.     Heart sounds: Normal heart sounds. No murmur heard.    No  friction rub. No gallop.  Pulmonary:     Effort: Pulmonary effort is normal. No respiratory distress.     Breath sounds: Normal breath sounds. No stridor. No wheezing, rhonchi or rales.  Chest:     Chest wall: No tenderness.  Musculoskeletal:        General: Normal range of motion.     Cervical back: Normal range of motion and neck supple.  Skin:    General: Skin is warm and dry.     Capillary Refill: Capillary refill takes less than 2 seconds.     Coloration: Skin is not jaundiced or pale.     Findings: No bruising, erythema, lesion or rash.  Neurological:     General: No focal deficit present.     Mental Status: She is alert and oriented to person, place, and time. Mental  status is at baseline.  Psychiatric:        Mood and Affect: Mood normal.        Behavior: Behavior normal.        Thought Content: Thought content normal.        Judgment: Judgment normal.     Results for orders placed or performed in visit on 12/07/23  CBC with Differential/Platelet   Collection Time: 12/07/23 10:03 AM  Result Value Ref Range   WBC 3.8 3.4 - 10.8 x10E3/uL   RBC 4.97 3.77 - 5.28 x10E6/uL   Hemoglobin 14.7 11.1 - 15.9 g/dL   Hematocrit 81.1 91.4 - 46.6 %   MCV 91 79 - 97 fL   MCH 29.6 26.6 - 33.0 pg   MCHC 32.6 31.5 - 35.7 g/dL   RDW 78.2 95.6 - 21.3 %   Platelets 200 150 - 450 x10E3/uL   Neutrophils 57 Not Estab. %   Lymphs 26 Not Estab. %   Monocytes 10 Not Estab. %   Eos 6 Not Estab. %   Basos 1 Not Estab. %   Neutrophils Absolute 2.2 1.4 - 7.0 x10E3/uL   Lymphocytes Absolute 1.0 0.7 - 3.1 x10E3/uL   Monocytes Absolute 0.4 0.1 - 0.9 x10E3/uL   EOS (ABSOLUTE) 0.2 0.0 - 0.4 x10E3/uL   Basophils Absolute 0.0 0.0 - 0.2 x10E3/uL   Immature Granulocytes 0 Not Estab. %   Immature Grans (Abs) 0.0 0.0 - 0.1 x10E3/uL  Basic metabolic panel   Collection Time: 12/07/23 10:03 AM  Result Value Ref Range   Glucose 82 70 - 99 mg/dL   BUN 26 8 - 27 mg/dL   Creatinine, Ser 0.86 0.57 - 1.00 mg/dL   eGFR 63 >57 QI/ONG/2.95   BUN/Creatinine Ratio 27 12 - 28   Sodium 144 134 - 144 mmol/L   Potassium 4.6 3.5 - 5.2 mmol/L   Chloride 108 (H) 96 - 106 mmol/L   CO2 23 20 - 29 mmol/L   Calcium 9.4 8.7 - 10.3 mg/dL      Assessment & Plan:   Problem List Items Addressed This Visit       Genitourinary   Benign hypertensive renal disease (Chronic)   BP running low with weight loss. Will stop lisinopril  and recheck in 3 months. Call with any concerns.         Other   Binge eating disorder - Primary   Under good control on current regimen. Continue current regimen. Continue to monitor. Call with any concerns. Refills given for 3 months. Labs drawn today.         Overweight (BMI 25.0-29.9)   Congratulated patient on  40lb weight loss! Continue current dose of zepbound. Recheck 3 months.         Follow up plan: Return in about 3 months (around 06/21/2024) for physical.

## 2024-03-21 NOTE — Assessment & Plan Note (Signed)
 Congratulated patient on 40lb weight loss! Continue current dose of zepbound. Recheck 3 months.

## 2024-03-21 NOTE — Assessment & Plan Note (Signed)
Under good control on current regimen. Continue current regimen. Continue to monitor. Call with any concerns. Refills given for 3 months. Labs drawn today.   

## 2024-05-05 ENCOUNTER — Other Ambulatory Visit: Payer: Self-pay | Admitting: Family Medicine

## 2024-05-07 NOTE — Telephone Encounter (Signed)
 Requested medication (s) are due for refill today: na  Requested medication (s) are on the active medication list: yes   Last refill:  06/21/23-06/20/24 #60 2 refills   Future visit scheduled: yes in 1 month   Notes to clinic:   tapered drug. Do you want to refill Rx?     Requested Prescriptions  Pending Prescriptions Disp Refills   naproxen  (NAPROSYN ) 500 MG tablet [Pharmacy Med Name: NAPROXEN  500 MG TAB] 60 tablet 2    Sig: TAKE 1 TABLET BY MOUTH 2 TIMES DAILY WITH A MEAL FOR NEXT FEW DAYS AND THEN AS NEEDED.     Analgesics:  NSAIDS Failed - 05/07/2024 11:44 AM      Failed - Manual Review: Labs are only required if the patient has taken medication for more than 8 weeks.      Passed - Cr in normal range and within 360 days    Creatinine, Ser  Date Value Ref Range Status  12/07/2023 0.96 0.57 - 1.00 mg/dL Final         Passed - HGB in normal range and within 360 days    Hemoglobin  Date Value Ref Range Status  12/07/2023 14.7 11.1 - 15.9 g/dL Final         Passed - PLT in normal range and within 360 days    Platelets  Date Value Ref Range Status  12/07/2023 200 150 - 450 x10E3/uL Final         Passed - HCT in normal range and within 360 days    Hematocrit  Date Value Ref Range Status  12/07/2023 45.1 34.0 - 46.6 % Final         Passed - eGFR is 30 or above and within 360 days    GFR calc Af Amer  Date Value Ref Range Status  04/08/2020 88 >59 mL/min/1.73 Final    Comment:    **[facility] currently reports eGFR in compliance with the current**   recommendations of the [employer]. [facility] will   update reporting as new guidelines are published from the NKF-ASN   Task force.    GFR, Estimated  Date Value Ref Range Status  04/24/2023 54 (L) >60 mL/min Final    Comment:    (NOTE) Calculated using the CKD-EPI Creatinine Equation (2021)    eGFR  Date Value Ref Range Status  12/07/2023 63 >59 mL/min/1.73 Final         Passed - Patient is not  pregnant      Passed - Valid encounter within last 12 months    Recent Outpatient Visits           1 month ago Moderate binge-eating disorder   [facility] [place], Megan P, DO   3 months ago Overweight (BMI 25.0-29.9)   [place] [place], Megan P, DO   5 months ago Benign hypertensive renal disease   [facility] Vicci Duwaine SQUIBB, DO       Future Appointments             In 1 month Vicci, Duwaine SQUIBB, DO [facility]

## 2024-06-10 ENCOUNTER — Encounter: Payer: Self-pay | Admitting: Family Medicine

## 2024-07-02 ENCOUNTER — Ambulatory Visit: Admitting: Family Medicine

## 2024-07-02 ENCOUNTER — Encounter: Payer: Self-pay | Admitting: Family Medicine

## 2024-07-02 VITALS — BP 132/72 | HR 62 | Temp 98.0°F | Ht 69.0 in | Wt 152.0 lb

## 2024-07-02 DIAGNOSIS — Z1231 Encounter for screening mammogram for malignant neoplasm of breast: Secondary | ICD-10-CM

## 2024-07-02 DIAGNOSIS — Z0000 Encounter for general adult medical examination without abnormal findings: Secondary | ICD-10-CM

## 2024-07-02 DIAGNOSIS — Z136 Encounter for screening for cardiovascular disorders: Secondary | ICD-10-CM | POA: Diagnosis not present

## 2024-07-02 DIAGNOSIS — D509 Iron deficiency anemia, unspecified: Secondary | ICD-10-CM

## 2024-07-02 DIAGNOSIS — F50811 Binge eating disorder, moderate: Secondary | ICD-10-CM

## 2024-07-02 DIAGNOSIS — R200 Anesthesia of skin: Secondary | ICD-10-CM

## 2024-07-02 DIAGNOSIS — I129 Hypertensive chronic kidney disease with stage 1 through stage 4 chronic kidney disease, or unspecified chronic kidney disease: Secondary | ICD-10-CM | POA: Diagnosis not present

## 2024-07-02 DIAGNOSIS — F339 Major depressive disorder, recurrent, unspecified: Secondary | ICD-10-CM | POA: Diagnosis not present

## 2024-07-02 DIAGNOSIS — I2510 Atherosclerotic heart disease of native coronary artery without angina pectoris: Secondary | ICD-10-CM

## 2024-07-02 DIAGNOSIS — I700 Atherosclerosis of aorta: Secondary | ICD-10-CM | POA: Diagnosis not present

## 2024-07-02 DIAGNOSIS — Z23 Encounter for immunization: Secondary | ICD-10-CM

## 2024-07-02 DIAGNOSIS — D692 Other nonthrombocytopenic purpura: Secondary | ICD-10-CM | POA: Diagnosis not present

## 2024-07-02 DIAGNOSIS — Z1211 Encounter for screening for malignant neoplasm of colon: Secondary | ICD-10-CM

## 2024-07-02 DIAGNOSIS — Z8639 Personal history of other endocrine, nutritional and metabolic disease: Secondary | ICD-10-CM | POA: Diagnosis not present

## 2024-07-02 LAB — MICROALBUMIN, URINE WAIVED
Creatinine, Urine Waived: 100 mg/dL (ref 10–300)
Microalb, Ur Waived: 30 mg/L — ABNORMAL HIGH (ref 0–19)
Microalb/Creat Ratio: <30 mg/g (ref <30)

## 2024-07-02 MED ORDER — LISDEXAMFETAMINE DIMESYLATE 60 MG PO CAPS: 60.0000 mg | ORAL_CAPSULE | ORAL | 0 refills | Status: DC

## 2024-07-02 MED ORDER — FAMOTIDINE 20 MG PO TABS: 20.0000 mg | ORAL_TABLET | Freq: Two times a day (BID) | ORAL | 1 refills | Status: AC

## 2024-07-02 MED ORDER — ALBUTEROL SULFATE HFA 108 (90 BASE) MCG/ACT IN AERS: 2.0000 | INHALATION_SPRAY | Freq: Four times a day (QID) | RESPIRATORY_TRACT | 1 refills | Status: AC | PRN

## 2024-07-02 MED ORDER — FERROUS SULFATE 325 (65 FE) MG PO TBEC: 325.0000 mg | DELAYED_RELEASE_TABLET | Freq: Every day | ORAL | 3 refills | Status: AC

## 2024-07-02 NOTE — Progress Notes (Unsigned)
 BP 132/72   Pulse 62   Temp 98 F (36.7 C) (Oral)   Ht 5' 9 (1.753 m)   Wt 152 lb (68.9 kg)   SpO2 99%   BMI 22.45 kg/m    Subjective:    Patient ID: Mckenzie Webster, female    DOB: [birth year], 73 y.o.   MRN: [id number]  HPI: Mckenzie Webster is a 73 y.o. female presenting on 07/02/2024 for comprehensive medical examination. Current medical complaints include:  HISTORY OF OBESITY Duration: chronic Previous attempts at weight loss: diet, exercise, weight loss medicine Complications of obesity: CAD, hyperlipidemia, depression, GERD, HTN Peak weight: 260 Weight loss goal: to he healthy  Weight loss to date: 108lbs Requesting obesity pharmacotherapy: yes Current weight loss supplements/medications: yes Previous weight loss supplements/meds: no  Vyvanse  continues to help with binge eating. Feeling well. No concerns.   HYPERTENSION  Hypertension status: controlled  Satisfied with current treatment? yes Duration of hypertension: chronic BP monitoring frequency:  rarely BP medication side effects:  no Medication compliance: not on anything Previous BP meds:lisinopril  Aspirin: no Recurrent headaches: no Visual changes: no Palpitations: no Dyspnea: no Chest pain: no Lower extremity edema: no Dizzy/lightheaded: no   She currently lives with: alone Menopausal Symptoms: no  Depression Screen done today and results listed below:     07/02/2024    8:53 AM 03/21/2024    8:13 AM 01/18/2024    9:03 AM 01/10/2024    2:42 PM 12/07/2023    9:29 AM  Depression screen PHQ 2/9  Decreased Interest 0 0 0 0 0  Down, Depressed, Hopeless 0 0 0 0 0  PHQ - 2 Score 0 0 0 0 0  Altered sleeping 0 0 0 0 0  Tired, decreased energy 0 0 0 0 0  Change in appetite 0 0 0 0 0  Feeling bad or failure about yourself  0 0 0 0 0  Trouble concentrating 0 0 0 0 0  Moving slowly or fidgety/restless 0 0 0 0 0  Suicidal thoughts 0 0 0 0 0  PHQ-9 Score 0 0 0 0 0  Difficult doing work/chores   Not difficult  at all Not difficult at all Not difficult at all    Past Medical History:  Past Medical History:  Diagnosis Date   Allergy    Anemia    Cataract    Depression    Dry eye syndrome    GERD (gastroesophageal reflux disease)    Neuromuscular disorder (HCC)    Positive colorectal cancer screening using Cologuard test 02/14/2017   Stomach ulcer     Surgical History:  Past Surgical History:  Procedure Laterality Date   COLONOSCOPY WITH PROPOFOL  N/A 04/27/2017   Procedure: COLONOSCOPY WITH PROPOFOL ;  Surgeon: Therisa Bi, MD;  [facility] ENDOSCOPY;  Service: Endoscopy;  Laterality: N/A;   COLONOSCOPY WITH PROPOFOL  N/A 05/09/2021   Procedure: COLONOSCOPY WITH PROPOFOL ;  Surgeon: Therisa Bi, MD;  Location: [facility] ENDOSCOPY;  Service: Gastroenterology;  Laterality: N/A;   EYE SURGERY     Cataract   FOOT SURGERY     Change the alignment of the toes to stop the formation of corns   gastric bypass  1981    Medications:  Current Outpatient Medications on File Prior to Visit  Medication Sig   cetirizine  (ZYRTEC ) 10 MG tablet Take 10 mg by mouth daily.   fluticasone  (FLONASE ) 50 MCG/ACT nasal spray Place 2 sprays into both nostrils daily.    naproxen  (NAPROSYN ) 500 MG  tablet TAKE 1 TABLET BY MOUTH 2 TIMES DAILY WITH A MEAL FOR NEXT FEW DAYS AND THEN AS NEEDED.   omeprazole  (PRILOSEC) 20 MG capsule Take 20 mg by mouth daily. (Patient taking differently: Take 20 mg by mouth as needed.)   tirzepatide  10 MG/0.5ML injection vial Inject 10 mg into the skin once a week.   hydroxychloroquine  (PLAQUENIL ) 200 MG tablet Take 200 mg by mouth 2 (two) times daily.   No current facility-administered medications on file prior to visit.    Allergies:  No Known Allergies  Social History:  Social History   Socioeconomic History   Marital status: Divorced    Spouse name: Not on file   Number of children: 1   Years of education: Not on file   Highest education level: Some college, no degree   Occupational History   Occupation: Occupational psychologist part time    Occupation: retired  Tobacco Use   Smoking status: Former    Current packs/day: 0.00    Average packs/day: 2.5 packs/day for 20.0 years (50.0 ttl pk-yrs)    Types: Cigarettes    Start date: 52    Quit date: 10/30/1989    Years since quitting: 34.6   Smokeless tobacco: Never  Vaping Use   Vaping status: Never Used  Substance and Sexual Activity   Alcohol use: Not Currently    Comment: rare   Drug use: No   Sexual activity: Not Currently    Birth control/protection: None  Other Topics Concern   Not on file  Social History Narrative   Not on file   Social Drivers of Health   Financial Resource Strain: Low Risk  (07/01/2024)   Overall Financial Resource Strain (CARDIA)    Difficulty of Paying Living Expenses: Not hard at all  Food Insecurity: No Food Insecurity (07/01/2024)   Hunger Vital Sign    Worried About Running Out of Food in the Last Year: Never true    Ran Out of Food in the Last Year: Never true  Transportation Needs: No Transportation Needs (07/01/2024)   PRAPARE - Administrator, Civil Service (Medical): No    Lack of Transportation (Non-Medical): No  Physical Activity: Insufficiently Active (07/01/2024)   Exercise Vital Sign    Days of Exercise per Week: 7 days    Minutes of Exercise per Session: 20 min  Stress: No Stress Concern Present (07/01/2024)   [employer] of Occupational Health - Occupational Stress Questionnaire    Feeling of Stress: Not at all  Social Connections: Moderately Integrated (07/01/2024)   Social Connection and Isolation Panel    Frequency of Communication with Friends and Family: More than three times a week    Frequency of Social Gatherings with Friends and Family: Three times a week    Attends Religious Services: More than 4 times per year    Active Member of Clubs or Organizations: Yes    Attends Banker Meetings: More than 4 times per year    Marital  Status: Divorced  Intimate Partner Violence: Not At Risk (01/10/2024)   Humiliation, Afraid, Rape, and Kick questionnaire    Fear of Current or Ex-Partner: No    Emotionally Abused: No    Physically Abused: No    Sexually Abused: No   Social History   Tobacco Use  Smoking Status Former   Current packs/day: 0.00   Average packs/day: 2.5 packs/day for 20.0 years (50.0 ttl pk-yrs)   Types: Cigarettes   Start date: 80  Quit date: 10/30/1989   Years since quitting: 34.6  Smokeless Tobacco Never   Social History   Substance and Sexual Activity  Alcohol Use Not Currently   Comment: rare    Family History:  Family History  Problem Relation Age of Onset   Heart disease Mother    COPD Mother    Arthritis Mother    Hypertension Mother    Obesity Mother    Heart disease Father    Hypertension Father    Arthritis Father    Hearing loss Father    Heart disease Maternal Grandfather    Hypertension Maternal Grandfather    Alzheimer's disease Paternal Grandmother    Breast cancer Neg Hx     Past medical history, surgical history, medications, allergies, family history and social history reviewed with patient today and changes made to appropriate areas of the chart.   Review of Systems  Constitutional: Negative.   HENT:  Positive for hearing loss. Negative for congestion, ear discharge, ear pain, nosebleeds, sinus pain, sore throat and tinnitus.   Eyes: Negative.   Respiratory: Negative.  Negative for stridor.   Cardiovascular: Negative.   Gastrointestinal: Negative.   Genitourinary: Negative.   Musculoskeletal: Negative.   Skin: Negative.   Neurological: Negative.   Endo/Heme/Allergies:  Negative for environmental allergies and polydipsia. Bruises/bleeds easily.  Psychiatric/Behavioral: Negative.     All other ROS negative except what is listed above and in the HPI.      Objective:    BP 132/72   Pulse 62   Temp 98 F (36.7 C) (Oral)   Ht 5' 9 (1.753 m)   Wt 152  lb (68.9 kg)   SpO2 99%   BMI 22.45 kg/m   Wt Readings from Last 3 Encounters:  07/02/24 152 lb (68.9 kg)  03/21/24 172 lb 6.4 oz (78.2 kg)  01/18/24 193 lb 6.4 oz (87.7 kg)    Physical Exam Vitals and nursing note reviewed.  Constitutional:      General: She is not in acute distress.    Appearance: Normal appearance. She is not ill-appearing, toxic-appearing or diaphoretic.  HENT:     Head: Normocephalic and atraumatic.     Right Ear: Tympanic membrane, ear canal and external ear normal. There is no impacted cerumen.     Left Ear: Tympanic membrane, ear canal and external ear normal. There is no impacted cerumen.     Nose: Nose normal. No congestion or rhinorrhea.     Mouth/Throat:     Mouth: Mucous membranes are moist.     Pharynx: Oropharynx is clear. No oropharyngeal exudate or posterior oropharyngeal erythema.  Eyes:     General: No scleral icterus.       Right eye: No discharge.        Left eye: No discharge.     Extraocular Movements: Extraocular movements intact.     Conjunctiva/sclera: Conjunctivae normal.     Pupils: Pupils are equal, round, and reactive to light.  Neck:     Vascular: No carotid bruit.  Cardiovascular:     Rate and Rhythm: Normal rate and regular rhythm.     Pulses: Normal pulses.     Heart sounds: No murmur heard.    No friction rub. No gallop.  Pulmonary:     Effort: Pulmonary effort is normal. No respiratory distress.     Breath sounds: Normal breath sounds. No stridor. No wheezing, rhonchi or rales.  Chest:     Chest wall: No tenderness.  Abdominal:  General: Abdomen is flat. Bowel sounds are normal. There is no distension.     Palpations: Abdomen is soft. There is no mass.     Tenderness: There is no abdominal tenderness. There is no right CVA tenderness, left CVA tenderness, guarding or rebound.     Hernia: No hernia is present.  Genitourinary:    Comments: Breast and pelvic exams deferred with shared decision making Musculoskeletal:         General: No swelling, tenderness, deformity or signs of injury.     Cervical back: Normal range of motion and neck supple. No rigidity. No muscular tenderness.     Right lower leg: No edema.     Left lower leg: No edema.  Lymphadenopathy:     Cervical: No cervical adenopathy.  Skin:    General: Skin is warm and dry.     Capillary Refill: Capillary refill takes less than 2 seconds.     Coloration: Skin is not jaundiced or pale.     Findings: No bruising, erythema, lesion or rash.  Neurological:     General: No focal deficit present.     Mental Status: She is alert and oriented to person, place, and time. Mental status is at baseline.     Cranial Nerves: No cranial nerve deficit.     Sensory: No sensory deficit.     Motor: No weakness.     Coordination: Coordination normal.     Gait: Gait normal.     Deep Tendon Reflexes: Reflexes normal.  Psychiatric:        Mood and Affect: Mood normal.        Behavior: Behavior normal.        Thought Content: Thought content normal.        Judgment: Judgment normal.     Results for orders placed or performed in visit on 07/02/24  Microalbumin, Urine Waived   Collection Time: 07/02/24  9:33 AM  Result Value Ref Range   Microalb, Ur Waived 30 (H) 0 - 19 mg/L   Creatinine, Urine Waived 100 10 - 300 mg/dL   Microalb/Creat Ratio <30 <30 mg/g  CBC with Differential/Platelet   Collection Time: 07/02/24  9:34 AM  Result Value Ref Range   WBC 4.1 3.4 - 10.8 x10E3/uL   RBC 4.29 3.77 - 5.28 x10E6/uL   Hemoglobin 13.2 11.1 - 15.9 g/dL   Hematocrit 59.4 65.9 - 46.6 %   MCV 94 79 - 97 fL   MCH 30.8 26.6 - 33.0 pg   MCHC 32.6 31.5 - 35.7 g/dL   RDW 87.6 88.2 - 84.5 %   Platelets 212 150 - 450 x10E3/uL   Neutrophils 63 Not Estab. %   Lymphs 21 Not Estab. %   Monocytes 8 Not Estab. %   Eos 7 Not Estab. %   Basos 1 Not Estab. %   Neutrophils Absolute 2.6 1.4 - 7.0 x10E3/uL   Lymphocytes Absolute 0.9 0.7 - 3.1 x10E3/uL   Monocytes Absolute  0.3 0.1 - 0.9 x10E3/uL   EOS (ABSOLUTE) 0.3 0.0 - 0.4 x10E3/uL   Basophils Absolute 0.0 0.0 - 0.2 x10E3/uL   Immature Granulocytes 0 Not Estab. %   Immature Grans (Abs) 0.0 0.0 - 0.1 x10E3/uL  Comprehensive metabolic panel with GFR   Collection Time: 07/02/24  9:34 AM  Result Value Ref Range   Glucose 77 70 - 99 mg/dL   BUN 29 (H) 8 - 27 mg/dL   Creatinine, Ser 9.08 0.57 - 1.00 mg/dL  eGFR 67 >59 mL/min/1.73   BUN/Creatinine Ratio 32 (H) 12 - 28   Sodium 140 134 - 144 mmol/L   Potassium 3.9 3.5 - 5.2 mmol/L   Chloride 102 96 - 106 mmol/L   CO2 22 20 - 29 mmol/L   Calcium 9.7 8.7 - 10.3 mg/dL   Total Protein 6.4 6.0 - 8.5 g/dL   Albumin 4.4 3.8 - 4.8 g/dL   Globulin, Total 2.0 1.5 - 4.5 g/dL   Bilirubin Total 0.6 0.0 - 1.2 mg/dL   Alkaline Phosphatase 88 44 - 121 IU/L   AST 38 0 - 40 IU/L   ALT 30 0 - 32 IU/L  Lipid Panel w/o Chol/HDL Ratio   Collection Time: 07/02/24  9:34 AM  Result Value Ref Range   Cholesterol, Total 176 100 - 199 mg/dL   Triglycerides 43 0 - 149 mg/dL   HDL 74 >60 mg/dL   VLDL Cholesterol Cal 9 5 - 40 mg/dL   LDL Chol Calc (NIH) 93 0 - 99 mg/dL  TSH   Collection Time: 07/02/24  9:34 AM  Result Value Ref Range   TSH 3.650 0.450 - 4.500 uIU/mL  B12   Collection Time: 07/02/24  9:34 AM  Result Value Ref Range   Vitamin B-12 872 232 - 1,245 pg/mL  Ferritin   Collection Time: 07/02/24  9:34 AM  Result Value Ref Range   Ferritin 157 (H) 15 - 150 ng/mL  Iron Binding Cap (TIBC)([facility])   Collection Time: 07/02/24  9:34 AM  Result Value Ref Range   Total Iron Binding Capacity 229 (L) 250 - 450 ug/dL   UIBC 820 881 - 630 ug/dL   Iron 50 27 - 860 ug/dL   Iron Saturation 22 15 - 55 %      Assessment & Plan:   Problem List Items Addressed This Visit       Cardiovascular and Mediastinum   Aortic atherosclerosis (HCC) (Chronic)   Will keep BP and cholesterol under good control. Continue to monitor. Call with any concerns.       Senile  purpura (HCC)   Reassured patient. Continue to monitor.       CAD (coronary artery disease)   Will keep BP and cholesterol under good control. Continue to monitor. Call with any concerns.       Relevant Orders   Lipid Panel w/o Chol/HDL Ratio (Completed)     Genitourinary   Benign hypertensive renal disease (Chronic)   Better on recheck. Will continue to monitor. Continue DASH diet. Call with any concerns.       Relevant Orders   Microalbumin, Urine Waived (Completed)   CBC with Differential/Platelet (Completed)   Comprehensive metabolic panel with GFR (Completed)   TSH (Completed)     Other   Iron deficiency anemia   Rechecking labs today. Await results. Treat as needed.       Relevant Medications   ferrous sulfate  325 (65 FE) MG EC tablet   Other Relevant Orders   CBC with Differential/Platelet (Completed)   Ferritin (Completed)   Iron Binding Cap (TIBC)([facility]) (Completed)   Binge eating disorder   Under good control on current regimen. Continue current regimen. Continue to monitor. Call with any concerns. Refills given for 3 months. Follow up 3 months.        Depression, recurrent (HCC)   Doing well not on medicine. Continue to monitor. Call with any concerns.       History of obesity   Doing amazing! Has  lost over 100lbs on zepbound . Will continue current dose with goal of starting to lengthen out times between shots. Recheck 3 months. Call with any concerns.       Other Visit Diagnoses       Routine general medical examination at a health care facility    -  Primary   Vaccines up to date. Screening labs checked today. Pap N/A. Mammogram and colonoscopy ordered today. Continue diet and exercise. Call with any concerns.     Numbness       Will check on B12. Await results. Treat as needed.   Relevant Orders   B12 (Completed)     Encounter for screening mammogram for malignant neoplasm of breast       Mammogram ordered today.   Relevant Orders    MM 3D SCREENING MAMMOGRAM BILATERAL BREAST     Screening for colon cancer       Colonoscopy ordered today.   Relevant Orders   Ambulatory referral to Gastroenterology     Needs flu shot       Flu shot given today.   Relevant Orders   Flu vaccine trivalent PF, 6mos and older(Flulaval,Afluria,Fluarix,Fluzone) (Completed)        Follow up plan: Return in about 3 months (around 10/01/2024).   LABORATORY TESTING:  - Pap smear: not applicable  IMMUNIZATIONS:   - Tdap: Tetanus vaccination status reviewed: last tetanus booster within 10 years. - Influenza: Administered today - Pneumovax: Up to date - Prevnar: Up to date - COVID: Refused - HPV: Not applicable - Shingrix vaccine: Up to date  SCREENING: -Mammogram: Up to date  - Colonoscopy: Ordered today  - Bone Density: Up to date   PATIENT COUNSELING:   Advised to take 1 mg of folate supplement per day if capable of pregnancy.   Sexuality: Discussed sexually transmitted diseases, partner selection, use of condoms, avoidance of unintended pregnancy  and contraceptive alternatives.   Advised to avoid cigarette smoking.  I discussed with the patient that most people either abstain from alcohol or drink within safe limits (<=14/week and <=4 drinks/occasion for males, <=7/weeks and <= 3 drinks/occasion for females) and that the risk for alcohol disorders and other health effects rises proportionally with the number of drinks per week and how often a drinker exceeds daily limits.  Discussed cessation/primary prevention of drug use and availability of treatment for abuse.   Diet: Encouraged to adjust caloric intake to maintain  or achieve ideal body weight, to reduce intake of dietary saturated fat and total fat, to limit sodium intake by avoiding high sodium foods and not adding table salt, and to maintain adequate dietary potassium and calcium preferably from fresh fruits, vegetables, and low-fat dairy products.    stressed the  importance of regular exercise  Injury prevention: Discussed safety belts, safety helmets, smoke detector, smoking near bedding or upholstery.   Dental health: Discussed importance of regular tooth brushing, flossing, and dental visits.    NEXT PREVENTATIVE PHYSICAL DUE IN 1 YEAR. Return in about 3 months (around 10/01/2024).

## 2024-07-02 NOTE — Assessment & Plan Note (Signed)
 Under good control on current regimen. Continue current regimen. Continue to monitor. Call with any concerns. Refills given for 3 months. Follow up 3 months.

## 2024-07-03 LAB — LIPID PANEL W/O CHOL/HDL RATIO
Cholesterol, Total: 176 mg/dL (ref 100–199)
HDL: 74 mg/dL (ref >39)
LDL Chol Calc (NIH): 93 mg/dL (ref 0–99)
Triglycerides: 43 mg/dL (ref 0–149)
VLDL Cholesterol Cal: 9 mg/dL (ref 5–40)

## 2024-07-03 LAB — CBC WITH DIFFERENTIAL/PLATELET
Basophils Absolute: 0 x10E3/uL (ref 0.0–0.2)
Basos: 1 %
EOS (ABSOLUTE): 0.3 x10E3/uL (ref 0.0–0.4)
Eos: 7 %
Hematocrit: 40.5 % (ref 34.0–46.6)
Hemoglobin: 13.2 g/dL (ref 11.1–15.9)
Immature Grans (Abs): 0 x10E3/uL (ref 0.0–0.1)
Immature Granulocytes: 0 %
Lymphocytes Absolute: 0.9 x10E3/uL (ref 0.7–3.1)
Lymphs: 21 %
MCH: 30.8 pg (ref 26.6–33.0)
MCHC: 32.6 g/dL (ref 31.5–35.7)
MCV: 94 fL (ref 79–97)
Monocytes Absolute: 0.3 x10E3/uL (ref 0.1–0.9)
Monocytes: 8 %
Neutrophils Absolute: 2.6 x10E3/uL (ref 1.4–7.0)
Neutrophils: 63 %
Platelets: 212 x10E3/uL (ref 150–450)
RBC: 4.29 x10E6/uL (ref 3.77–5.28)
RDW: 12.3 % (ref 11.7–15.4)
WBC: 4.1 x10E3/uL (ref 3.4–10.8)

## 2024-07-03 LAB — COMPREHENSIVE METABOLIC PANEL WITH GFR
ALT: 30 IU/L (ref 0–32)
AST: 38 IU/L (ref 0–40)
Albumin: 4.4 g/dL (ref 3.8–4.8)
Alkaline Phosphatase: 88 IU/L (ref 44–121)
BUN/Creatinine Ratio: 32 — ABNORMAL HIGH (ref 12–28)
BUN: 29 mg/dL — ABNORMAL HIGH (ref 8–27)
Bilirubin Total: 0.6 mg/dL (ref 0.0–1.2)
CO2: 22 mmol/L (ref 20–29)
Calcium: 9.7 mg/dL (ref 8.7–10.3)
Chloride: 102 mmol/L (ref 96–106)
Creatinine, Ser: 0.91 mg/dL (ref 0.57–1.00)
Globulin, Total: 2 g/dL (ref 1.5–4.5)
Glucose: 77 mg/dL (ref 70–99)
Potassium: 3.9 mmol/L (ref 3.5–5.2)
Sodium: 140 mmol/L (ref 134–144)
Total Protein: 6.4 g/dL (ref 6.0–8.5)
eGFR: 67 mL/min/1.73 (ref >59)

## 2024-07-03 LAB — IRON AND TIBC
Iron Saturation: 22 % (ref 15–55)
Iron: 50 ug/dL (ref 27–139)
Total Iron Binding Capacity: 229 ug/dL — ABNORMAL LOW (ref 250–450)
UIBC: 179 ug/dL (ref 118–369)

## 2024-07-03 LAB — FERRITIN: Ferritin: 157 ng/mL — ABNORMAL HIGH (ref 15–150)

## 2024-07-03 LAB — TSH: TSH: 3.65 u[IU]/mL (ref 0.450–4.500)

## 2024-07-03 LAB — VITAMIN B12: Vitamin B-12: 872 pg/mL (ref 232–1245)

## 2024-07-03 NOTE — Assessment & Plan Note (Signed)
Doing well not on medicine. Continue to monitor. Call with any concerns.

## 2024-07-03 NOTE — Assessment & Plan Note (Signed)
 Better on recheck. Will continue to monitor. Continue DASH diet. Call with any concerns.

## 2024-07-03 NOTE — Assessment & Plan Note (Signed)
 Will keep BP and cholesterol under good control. Continue to monitor. Call with any concerns.

## 2024-07-03 NOTE — Assessment & Plan Note (Signed)
 Doing amazing! Has lost over 100lbs on zepbound . Will continue current dose with goal of starting to lengthen out times between shots. Recheck 3 months. Call with any concerns.

## 2024-07-03 NOTE — Assessment & Plan Note (Signed)
 Reassured patient. Continue to monitor.

## 2024-07-03 NOTE — Assessment & Plan Note (Signed)
 Rechecking labs today. Await results. Treat as needed.

## 2024-07-06 ENCOUNTER — Ambulatory Visit: Payer: Self-pay | Admitting: Family Medicine

## 2024-09-08 ENCOUNTER — Other Ambulatory Visit: Payer: Self-pay | Admitting: Nurse Practitioner

## 2024-09-11 ENCOUNTER — Encounter: Payer: Self-pay | Admitting: Family Medicine

## 2024-09-11 NOTE — Telephone Encounter (Signed)
 Requested by interface surescripts. Future visit 10/06/24.  Requested Prescriptions  Pending Prescriptions Disp Refills   naproxen  (NAPROSYN ) 500 MG tablet [Pharmacy Med Name: NAPROXEN  500 MG TAB] 60 tablet 0    Sig: TAKE 1 TABLET BY MOUTH 2 TIMES DAILY WITH A MEAL FOR NEXT FEW DAYS AND THEN AS NEEDED.     Analgesics:  NSAIDS Failed - 09/11/2024  7:59 AM      Failed - Manual Review: Labs are only required if the patient has taken medication for more than 8 weeks.      Passed - Cr in normal range and within 360 days    Creatinine, Ser  Date Value Ref Range Status  07/02/2024 0.91 0.57 - 1.00 mg/dL Final         Passed - HGB in normal range and within 360 days    Hemoglobin  Date Value Ref Range Status  07/02/2024 13.2 11.1 - 15.9 g/dL Final         Passed - PLT in normal range and within 360 days    Platelets  Date Value Ref Range Status  07/02/2024 212 150 - 450 x10E3/uL Final         Passed - HCT in normal range and within 360 days    Hematocrit  Date Value Ref Range Status  07/02/2024 40.5 34.0 - 46.6 % Final         Passed - eGFR is 30 or above and within 360 days    GFR calc Af Amer  Date Value Ref Range Status  04/08/2020 88 >59 mL/min/1.73 Final    Comment:    **[facility] currently reports eGFR in compliance with the current**   recommendations of the [employer]. [facility] will   update reporting as new guidelines are published from the NKF-ASN   Task force.    GFR, Estimated  Date Value Ref Range Status  04/24/2023 54 (L) >60 mL/min Final    Comment:    (NOTE) Calculated using the CKD-EPI Creatinine Equation (2021)    eGFR  Date Value Ref Range Status  07/02/2024 67 >59 mL/min/1.73 Final         Passed - Patient is not pregnant      Passed - Valid encounter within last 12 months    Recent Outpatient Visits           2 months ago Routine general medical examination at a health care facility   [facility], [place] P, DO   5 months ago Moderate binge-eating disorder   [facility] [place], [place] P, DO   7 months ago Overweight (BMI 25.0-29.9)   [facility] [place], [place] P, DO   9 months ago Benign hypertensive renal disease   [facility], [place], [place]

## 2024-10-03 ENCOUNTER — Ambulatory Visit: Admitting: Family Medicine

## 2024-10-06 ENCOUNTER — Encounter: Payer: Self-pay | Admitting: Family Medicine

## 2024-10-06 ENCOUNTER — Ambulatory Visit (INDEPENDENT_AMBULATORY_CARE_PROVIDER_SITE_OTHER): Admitting: Family Medicine

## 2024-10-06 VITALS — BP 128/70 | HR 64 | Temp 98.0°F | Ht 69.0 in | Wt 147.6 lb

## 2024-10-06 DIAGNOSIS — Z8639 Personal history of other endocrine, nutritional and metabolic disease: Secondary | ICD-10-CM

## 2024-10-06 DIAGNOSIS — F50811 Binge eating disorder, moderate: Secondary | ICD-10-CM

## 2024-10-06 MED ORDER — LISDEXAMFETAMINE DIMESYLATE 60 MG PO CAPS: 60.0000 mg | ORAL_CAPSULE | ORAL | 0 refills | Status: AC

## 2024-10-06 MED ORDER — TIRZEPATIDE-WEIGHT MANAGEMENT 10 MG/0.5ML ~~LOC~~ SOLN: 10.0000 mg | SUBCUTANEOUS | 5 refills | Status: AC

## 2024-10-06 NOTE — Assessment & Plan Note (Signed)
 Weight stable on 10mg  zepbound . Congratulated patient on 113lb weight loss. Maintenance mode now. Continue current regimen. Follow up 3 months.

## 2024-10-06 NOTE — Progress Notes (Signed)
 BP 128/70   Pulse 64   Temp 98 F (36.7 C) (Oral)   Ht 5' 9 (1.753 m)   Wt 147 lb 9.6 oz (67 kg)   SpO2 98%   BMI 21.80 kg/m    Subjective:    Patient ID: Mckenzie Webster, female    DOB: Feb 29, 1951, 73 y.o.   MRN: [id number]  HPI: Mckenzie Webster is a 73 y.o. female  Chief Complaint  Patient presents with   Obesity   Hypertension   HISTORY OF OBESITY Duration: chronic Previous attempts at weight loss: diet, exercise, weight loss medicine Complications of obesity: CAD, hyperlipidemia, depression, GERD, HTN Peak weight: 260 Weight loss goal: to he healthy  Weight loss to date: 113lbs Requesting obesity pharmacotherapy: yes Current weight loss supplements/medications: yes Previous weight loss supplements/meds: no  Feeling well on the vyvnase. Helps with binging. No concerns.   Relevant past medical, surgical, family and social history reviewed and updated as indicated. Interim medical history since our last visit reviewed. Allergies and medications reviewed and updated.  Review of Systems  Constitutional: Negative.   Respiratory: Negative.    Cardiovascular: Negative.   Musculoskeletal: Negative.   Neurological: Negative.   Psychiatric/Behavioral: Negative.      Per HPI unless specifically indicated above     Objective:    BP 128/70   Pulse 64   Temp 98 F (36.7 C) (Oral)   Ht 5' 9 (1.753 m)   Wt 147 lb 9.6 oz (67 kg)   SpO2 98%   BMI 21.80 kg/m   Wt Readings from Last 3 Encounters:  10/06/24 147 lb 9.6 oz (67 kg)  07/02/24 152 lb (68.9 kg)  03/21/24 172 lb 6.4 oz (78.2 kg)    Physical Exam Vitals and nursing note reviewed.  Constitutional:      General: She is not in acute distress.    Appearance: Normal appearance. She is not ill-appearing, toxic-appearing or diaphoretic.  HENT:     Head: Normocephalic and atraumatic.     Right Ear: External ear normal.     Left Ear: External ear normal.     Nose: Nose normal.     Mouth/Throat:     Mouth:  Mucous membranes are moist.     Pharynx: Oropharynx is clear.  Eyes:     General: No scleral icterus.       Right eye: No discharge.        Left eye: No discharge.     Extraocular Movements: Extraocular movements intact.     Conjunctiva/sclera: Conjunctivae normal.     Pupils: Pupils are equal, round, and reactive to light.  Cardiovascular:     Rate and Rhythm: Normal rate and regular rhythm.     Pulses: Normal pulses.     Heart sounds: Normal heart sounds. No murmur heard.    No friction rub. No gallop.  Pulmonary:     Effort: Pulmonary effort is normal. No respiratory distress.     Breath sounds: Normal breath sounds. No stridor. No wheezing, rhonchi or rales.  Chest:     Chest wall: No tenderness.  Musculoskeletal:        General: Normal range of motion.     Cervical back: Normal range of motion and neck supple.  Skin:    General: Skin is warm and dry.     Capillary Refill: Capillary refill takes less than 2 seconds.     Coloration: Skin is not jaundiced or pale.     Findings:  No bruising, erythema, lesion or rash.  Neurological:     General: No focal deficit present.     Mental Status: She is alert and oriented to person, place, and time. Mental status is at baseline.  Psychiatric:        Mood and Affect: Mood normal.        Behavior: Behavior normal.        Thought Content: Thought content normal.        Judgment: Judgment normal.     Results for orders placed or performed in visit on 07/02/24  Microalbumin, Urine Waived   Collection Time: 07/02/24  9:33 AM  Result Value Ref Range   Microalb, Ur Waived 30 (H) 0 - 19 mg/L   Creatinine, Urine Waived 100 10 - 300 mg/dL   Microalb/Creat Ratio <30 <30 mg/g  CBC with Differential/Platelet   Collection Time: 07/02/24  9:34 AM  Result Value Ref Range   WBC 4.1 3.4 - 10.8 x10E3/uL   RBC 4.29 3.77 - 5.28 x10E6/uL   Hemoglobin 13.2 11.1 - 15.9 g/dL   Hematocrit 59.4 65.9 - 46.6 %   MCV 94 79 - 97 fL   MCH 30.8 26.6 -  33.0 pg   MCHC 32.6 31.5 - 35.7 g/dL   RDW 87.6 88.2 - 84.5 %   Platelets 212 150 - 450 x10E3/uL   Neutrophils 63 Not Estab. %   Lymphs 21 Not Estab. %   Monocytes 8 Not Estab. %   Eos 7 Not Estab. %   Basos 1 Not Estab. %   Neutrophils Absolute 2.6 1.4 - 7.0 x10E3/uL   Lymphocytes Absolute 0.9 0.7 - 3.1 x10E3/uL   Monocytes Absolute 0.3 0.1 - 0.9 x10E3/uL   EOS (ABSOLUTE) 0.3 0.0 - 0.4 x10E3/uL   Basophils Absolute 0.0 0.0 - 0.2 x10E3/uL   Immature Granulocytes 0 Not Estab. %   Immature Grans (Abs) 0.0 0.0 - 0.1 x10E3/uL  Comprehensive metabolic panel with GFR   Collection Time: 07/02/24  9:34 AM  Result Value Ref Range   Glucose 77 70 - 99 mg/dL   BUN 29 (H) 8 - 27 mg/dL   Creatinine, Ser 9.08 0.57 - 1.00 mg/dL   eGFR 67 >40 fO/fpw/8.26   BUN/Creatinine Ratio 32 (H) 12 - 28   Sodium 140 134 - 144 mmol/L   Potassium 3.9 3.5 - 5.2 mmol/L   Chloride 102 96 - 106 mmol/L   CO2 22 20 - 29 mmol/L   Calcium 9.7 8.7 - 10.3 mg/dL   Total Protein 6.4 6.0 - 8.5 g/dL   Albumin 4.4 3.8 - 4.8 g/dL   Globulin, Total 2.0 1.5 - 4.5 g/dL   Bilirubin Total 0.6 0.0 - 1.2 mg/dL   Alkaline Phosphatase 88 44 - 121 IU/L   AST 38 0 - 40 IU/L   ALT 30 0 - 32 IU/L  Lipid Panel w/o Chol/HDL Ratio   Collection Time: 07/02/24  9:34 AM  Result Value Ref Range   Cholesterol, Total 176 100 - 199 mg/dL   Triglycerides 43 0 - 149 mg/dL   HDL 74 >60 mg/dL   VLDL Cholesterol Cal 9 5 - 40 mg/dL   LDL Chol Calc (NIH) 93 0 - 99 mg/dL  TSH   Collection Time: 07/02/24  9:34 AM  Result Value Ref Range   TSH 3.650 0.450 - 4.500 uIU/mL  B12   Collection Time: 07/02/24  9:34 AM  Result Value Ref Range   Vitamin B-12 872 232 - 1,245  pg/mL  Ferritin   Collection Time: 07/02/24  9:34 AM  Result Value Ref Range   Ferritin 157 (H) 15 - 150 ng/mL  Iron Binding Cap (TIBC)([facility])   Collection Time: 07/02/24  9:34 AM  Result Value Ref Range   Total Iron Binding Capacity 229 (L) 250 - 450 ug/dL    UIBC 820 881 - 630 ug/dL   Iron 50 27 - 860 ug/dL   Iron Saturation 22 15 - 55 %      Assessment & Plan:   Problem List Items Addressed This Visit       Other   Binge eating disorder   Under good control on current regimen. Continue current regimen. Continue to monitor. Call with any concerns. Refills given for 3 months. Follow up 3 months.        History of obesity - Primary   Weight stable on 10mg  zepbound . Congratulated patient on 113lb weight loss. Maintenance mode now. Continue current regimen. Follow up 3 months.         Follow up plan: Return in about 3 months (around 01/04/2025).

## 2024-10-06 NOTE — Assessment & Plan Note (Signed)
 Under good control on current regimen. Continue current regimen. Continue to monitor. Call with any concerns. Refills given for 3 months. Follow up 3 months.

## 2024-11-27 ENCOUNTER — Other Ambulatory Visit: Payer: Self-pay | Admitting: Family Medicine

## 2024-11-28 NOTE — Telephone Encounter (Signed)
 Requested Prescriptions  Pending Prescriptions Disp Refills   naproxen  (NAPROSYN ) 500 MG tablet [Pharmacy Med Name: NAPROXEN  500 MG TAB] 180 tablet 0    Sig: TAKE 1 TABLET BY MOUTH 2 TIMES DAILY WITH A MEAL FOR NEXT FEW DAYS AND THEN AS NEEDED.     Analgesics:  NSAIDS Failed - 11/28/2024 10:01 AM      Failed - Manual Review: Labs are only required if the patient has taken medication for more than 8 weeks.      Passed - Cr in normal range and within 360 days    Creatinine, Ser  Date Value Ref Range Status  07/02/2024 0.91 0.57 - 1.00 mg/dL Final         Passed - HGB in normal range and within 360 days    Hemoglobin  Date Value Ref Range Status  07/02/2024 13.2 11.1 - 15.9 g/dL Final         Passed - PLT in normal range and within 360 days    Platelets  Date Value Ref Range Status  07/02/2024 212 150 - 450 x10E3/uL Final         Passed - HCT in normal range and within 360 days    Hematocrit  Date Value Ref Range Status  07/02/2024 40.5 34.0 - 46.6 % Final         Passed - eGFR is 30 or above and within 360 days    GFR calc Af Amer  Date Value Ref Range Status  04/08/2020 88 >59 mL/min/1.73 Final    Comment:    **[facility] currently reports eGFR in compliance with the current**   recommendations of the [employer]. [facility] will   update reporting as new guidelines are published from the NKF-ASN   Task force.    GFR, Estimated  Date Value Ref Range Status  04/24/2023 54 (L) >60 mL/min Final    Comment:    (NOTE) Calculated using the CKD-EPI Creatinine Equation (2021)    eGFR  Date Value Ref Range Status  07/02/2024 67 >59 mL/min/1.73 Final         Passed - Patient is not pregnant      Passed - Valid encounter within last 12 months    Recent Outpatient Visits           1 month ago History of obesity   [facility] [place], Megan P, DO   4 months ago Routine general medical examination at a health care facility   [facility], [place] P, DO   8 months ago Moderate binge-eating disorder   [facility] [place], Megan P, DO   10 months ago Overweight (BMI 25.0-29.9)   [place] [facility] [place], Megan P, DO   11 months ago Benign hypertensive renal disease   [place] [place], [place], [place]

## 2025-01-05 ENCOUNTER — Ambulatory Visit: Admitting: Family Medicine

## 2025-01-22 ENCOUNTER — Ambulatory Visit
